# Patient Record
Sex: Female | Born: 1985 | Hispanic: No | Marital: Single | Smoking: Former smoker
Health system: Southern US, Community
[De-identification: ages and names within clinical notes are randomized; demographics above are authoritative.]

## PROBLEM LIST (undated history)

## (undated) ENCOUNTER — Inpatient Hospital Stay (HOSPITAL_COMMUNITY): Payer: Self-pay

## (undated) DIAGNOSIS — R87619 Unspecified abnormal cytological findings in specimens from cervix uteri: Secondary | ICD-10-CM

## (undated) DIAGNOSIS — N83299 Other ovarian cyst, unspecified side: Secondary | ICD-10-CM

## (undated) HISTORY — DX: Unspecified abnormal cytological findings in specimens from cervix uteri: R87.619

---
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
## 2007-01-03 ENCOUNTER — Inpatient Hospital Stay (HOSPITAL_COMMUNITY): Admission: AD | Admit: 2007-01-03 | Discharge: 2007-01-03 | Payer: Self-pay | Admitting: Obstetrics and Gynecology

## 2007-01-03 ENCOUNTER — Ambulatory Visit: Payer: Self-pay | Admitting: Obstetrics and Gynecology

## 2007-01-30 ENCOUNTER — Ambulatory Visit (HOSPITAL_COMMUNITY): Admission: RE | Admit: 2007-01-30 | Discharge: 2007-01-30 | Payer: Self-pay | Admitting: Gynecology

## 2007-03-01 ENCOUNTER — Ambulatory Visit: Payer: Self-pay | Admitting: Obstetrics and Gynecology

## 2007-03-01 ENCOUNTER — Inpatient Hospital Stay (HOSPITAL_COMMUNITY): Admission: AD | Admit: 2007-03-01 | Discharge: 2007-03-01 | Payer: Self-pay | Admitting: Obstetrics and Gynecology

## 2007-03-08 ENCOUNTER — Ambulatory Visit (HOSPITAL_COMMUNITY): Admission: RE | Admit: 2007-03-08 | Discharge: 2007-03-08 | Payer: Self-pay | Admitting: Family Medicine

## 2007-03-20 ENCOUNTER — Inpatient Hospital Stay (HOSPITAL_COMMUNITY): Admission: AD | Admit: 2007-03-20 | Discharge: 2007-03-22 | Payer: Self-pay | Admitting: Gynecology

## 2007-03-20 ENCOUNTER — Ambulatory Visit: Payer: Self-pay | Admitting: Family Medicine

## 2007-12-06 IMAGING — US US OB COMP +14 WK
1 series · 14 of 28 positions shown · non-contrast
Comparison: none

OBSTETRICAL ULTRASOUND:

 This ultrasound exam was performed in the [HOSPITAL] Ultrasound Department.  The OB US report was generated in the [facility] system, and faxed to the ordering physician.  This report is also available in [REDACTED] PACS.

[Series 1: us ob comp +14 wk · 14 of 46 slices shown]
[im 2/46]
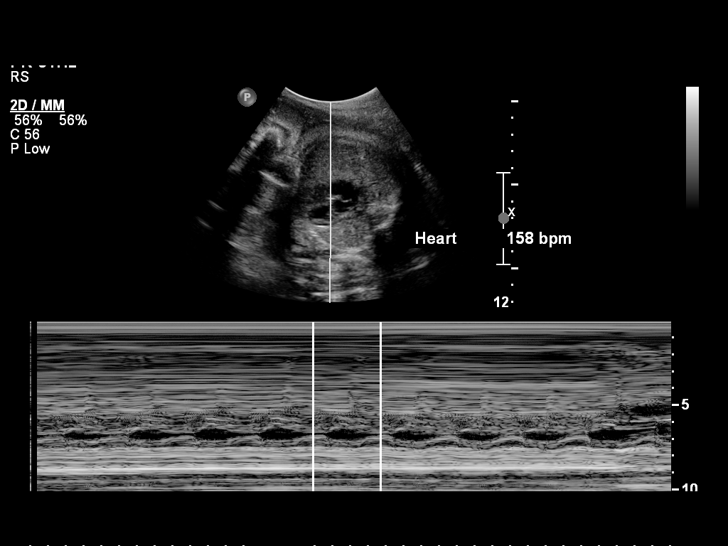
[im 6/46]
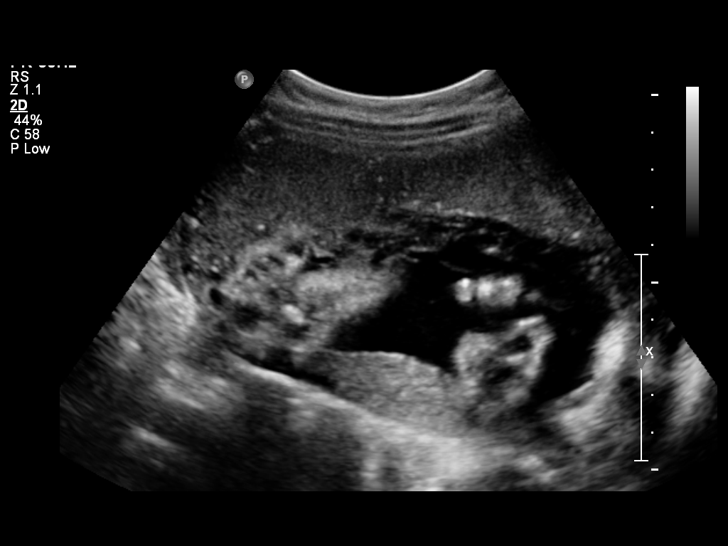
[im 9/46]
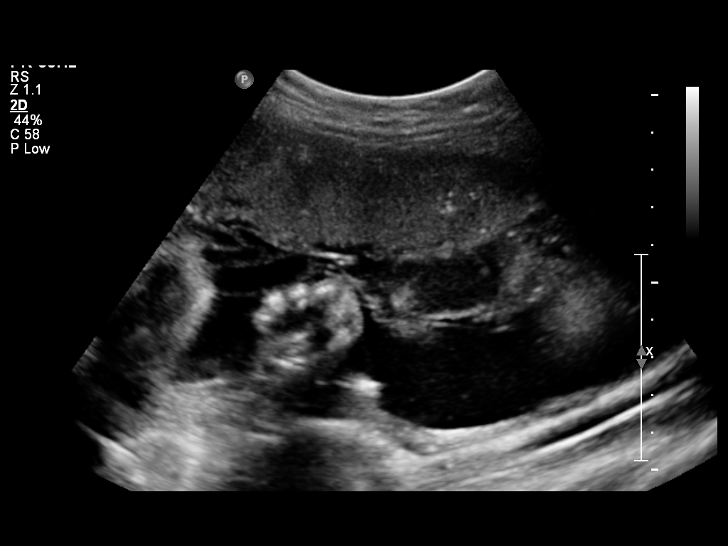
[im 12/46]
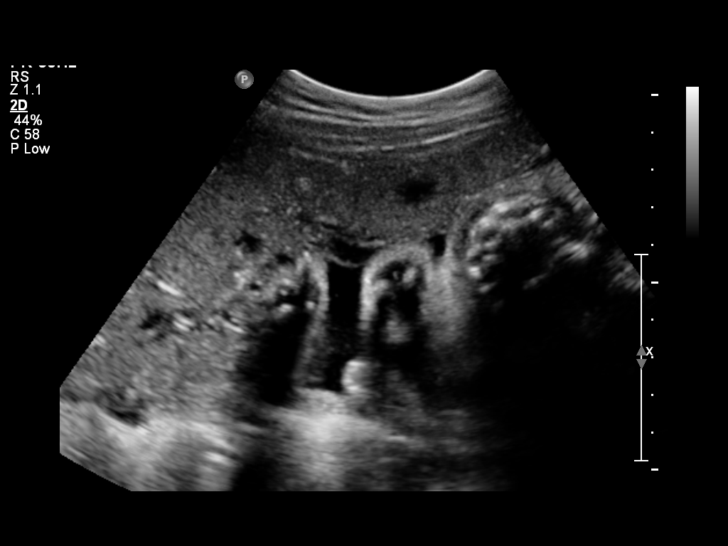
[im 16/46]
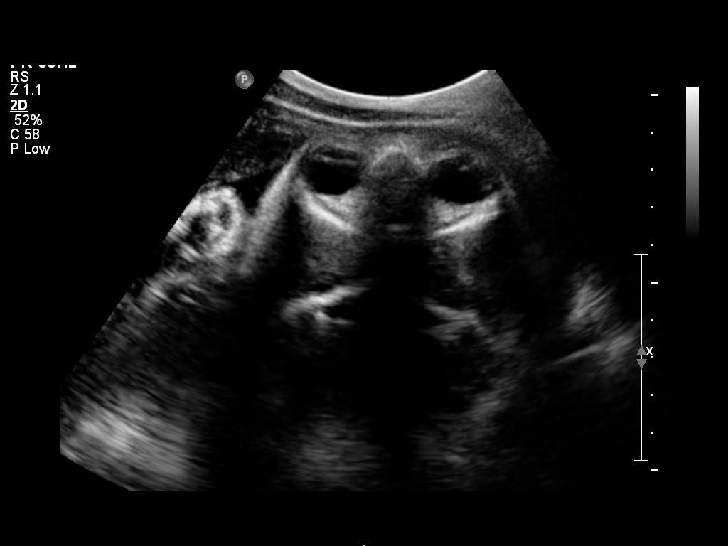
[im 19/46]
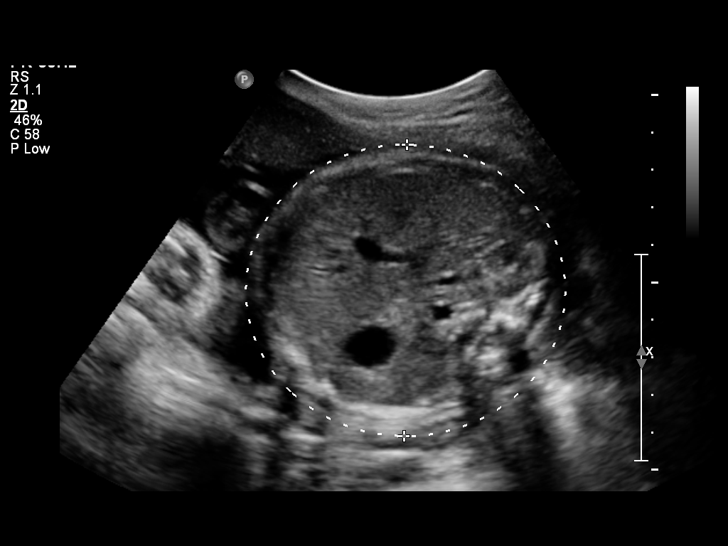
[im 22/46]
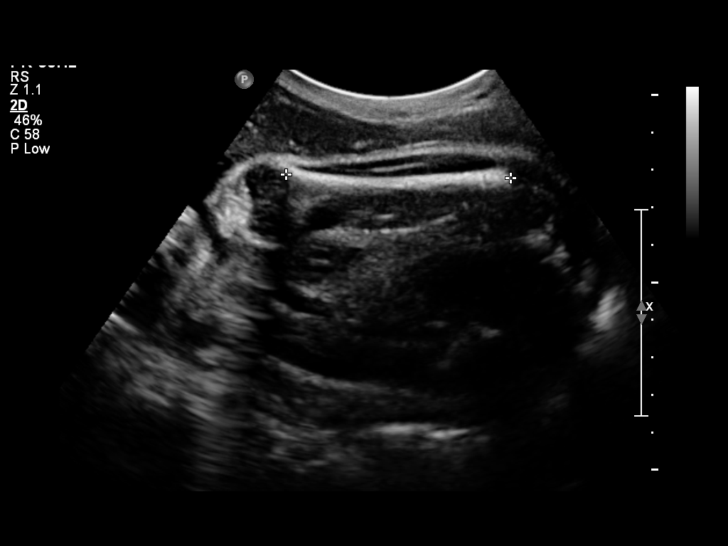
[im 26/46]
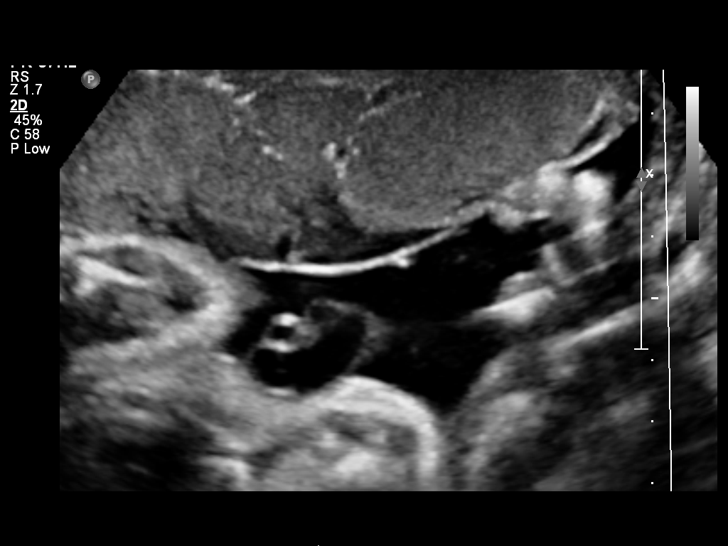
[im 29/46]
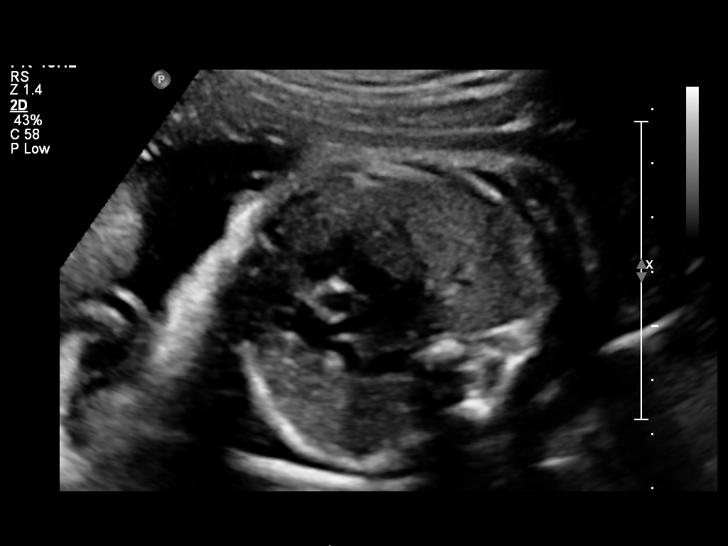
[im 32/46]
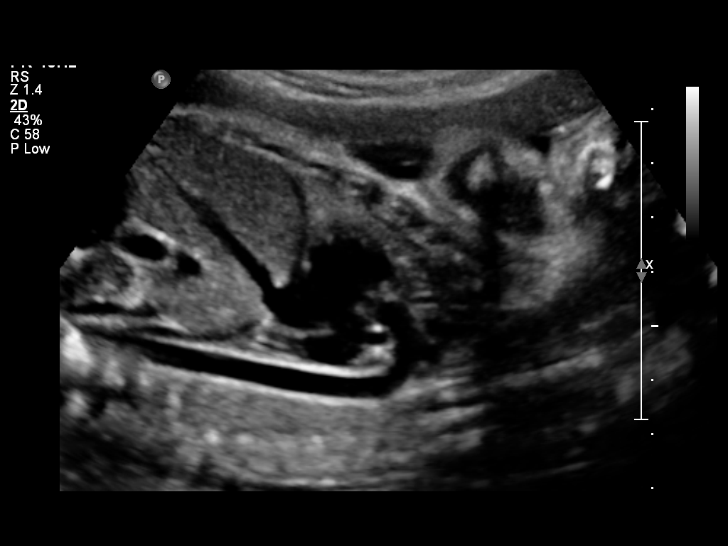
[im 36/46]
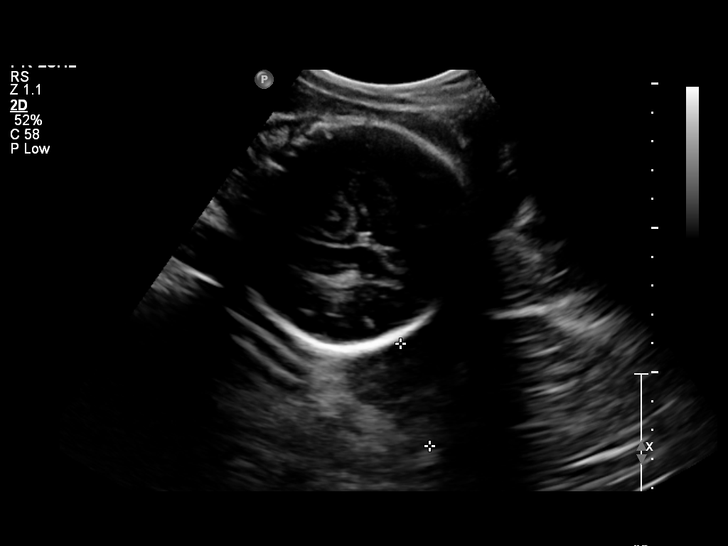
[im 39/46]
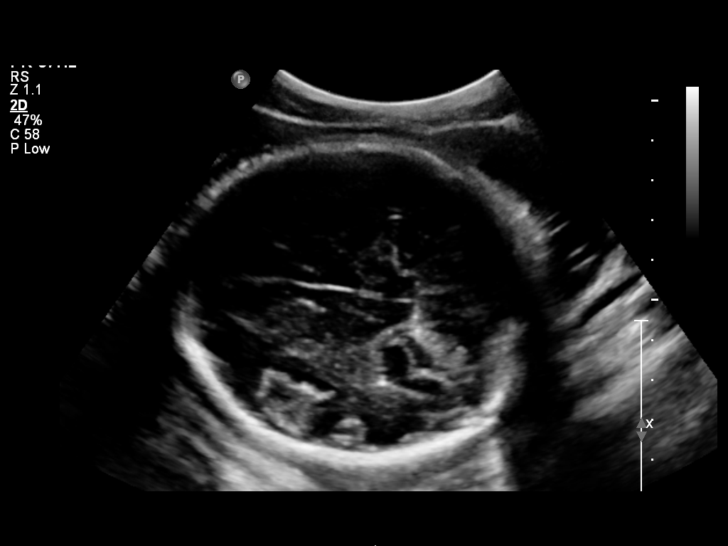
[im 42/46]
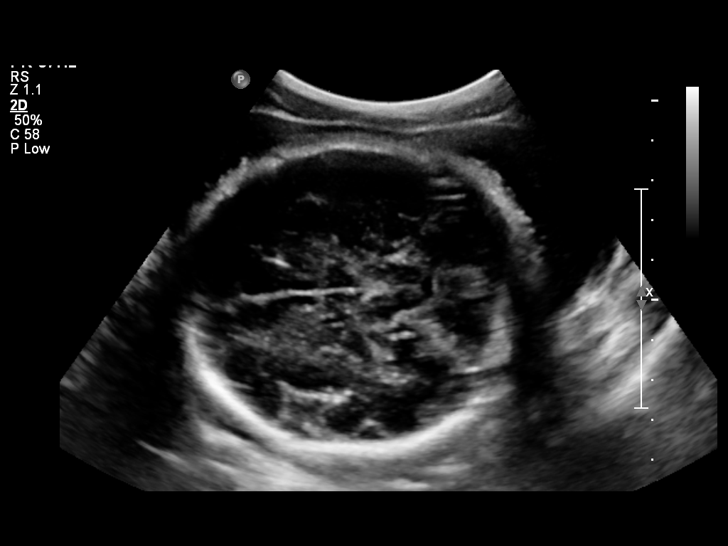
[im 46/46]
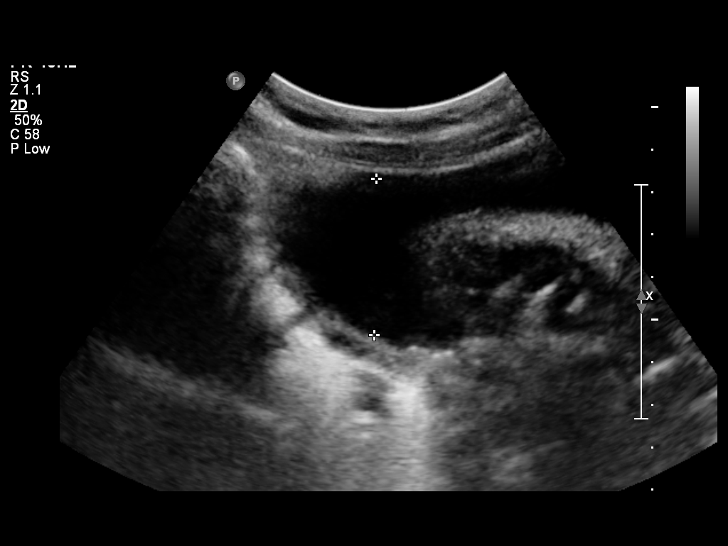

[14 of 28 positions shown; findings below may reference images not displayed]

IMPRESSION: See [facility] Obstetric US report.

## 2008-10-05 ENCOUNTER — Emergency Department: Payer: Self-pay | Admitting: Emergency Medicine

## 2008-10-21 ENCOUNTER — Emergency Department: Payer: Self-pay | Admitting: Emergency Medicine

## 2009-02-18 ENCOUNTER — Emergency Department: Payer: Self-pay | Admitting: Emergency Medicine

## 2009-11-07 ENCOUNTER — Emergency Department: Payer: Self-pay | Admitting: Emergency Medicine

## 2009-11-14 ENCOUNTER — Emergency Department: Payer: Self-pay | Admitting: Emergency Medicine

## 2009-12-16 ENCOUNTER — Emergency Department: Payer: Self-pay | Admitting: Emergency Medicine

## 2010-02-13 ENCOUNTER — Observation Stay: Payer: Self-pay

## 2010-03-12 ENCOUNTER — Emergency Department: Payer: Self-pay | Admitting: Emergency Medicine

## 2010-03-28 ENCOUNTER — Ambulatory Visit: Payer: Self-pay | Admitting: Obstetrics and Gynecology

## 2010-05-05 ENCOUNTER — Observation Stay: Payer: Self-pay | Admitting: Obstetrics & Gynecology

## 2010-05-20 ENCOUNTER — Observation Stay: Payer: Self-pay | Admitting: Obstetrics and Gynecology

## 2010-05-30 ENCOUNTER — Ambulatory Visit: Payer: Self-pay | Admitting: Family Medicine

## 2010-06-03 ENCOUNTER — Observation Stay: Payer: Self-pay

## 2010-06-07 ENCOUNTER — Observation Stay: Payer: Self-pay

## 2010-06-09 ENCOUNTER — Observation Stay: Payer: Self-pay

## 2010-06-10 ENCOUNTER — Inpatient Hospital Stay: Payer: Self-pay | Admitting: Obstetrics and Gynecology

## 2011-04-13 LAB — CBC
Hemoglobin: 11.4 — ABNORMAL LOW
MCHC: 33
RDW: 14.5 — ABNORMAL HIGH
WBC: 14.4 — ABNORMAL HIGH

## 2011-04-18 LAB — WET PREP, GENITAL: Trich, Wet Prep: NONE SEEN

## 2011-04-18 LAB — RAPID URINE DRUG SCREEN, HOSP PERFORMED
Amphetamines: NOT DETECTED
Barbiturates: NOT DETECTED
Benzodiazepines: NOT DETECTED
Cocaine: NOT DETECTED
Tetrahydrocannabinol: POSITIVE — AB

## 2011-04-18 LAB — URINALYSIS, ROUTINE W REFLEX MICROSCOPIC
Hgb urine dipstick: NEGATIVE
Ketones, ur: NEGATIVE
Specific Gravity, Urine: 1.01
Urobilinogen, UA: 1

## 2011-04-18 LAB — GC/CHLAMYDIA PROBE AMP, GENITAL
Chlamydia, DNA Probe: POSITIVE — AB
GC Probe Amp, Genital: NEGATIVE

## 2011-06-24 ENCOUNTER — Emergency Department: Payer: Self-pay | Admitting: Emergency Medicine

## 2011-10-02 ENCOUNTER — Emergency Department: Payer: Self-pay | Admitting: Emergency Medicine

## 2011-10-02 LAB — BASIC METABOLIC PANEL
Anion Gap: 12 (ref 7–16)
BUN: 11 mg/dL (ref 7–18)
Calcium, Total: 9.2 mg/dL (ref 8.5–10.1)
Co2: 24 mmol/L (ref 21–32)
Creatinine: 0.74 mg/dL (ref 0.60–1.30)
EGFR (African American): >60
EGFR (Non-African Amer.): >60
Glucose: 95 mg/dL (ref 65–99)
Osmolality: 282 (ref 275–301)
Sodium: 142 mmol/L (ref 136–145)

## 2011-10-02 LAB — CBC
HCT: 39.1 % (ref 35.0–47.0)
HGB: 12.6 g/dL (ref 12.0–16.0)
MCH: 25.2 pg — ABNORMAL LOW (ref 26.0–34.0)
MCHC: 32.1 g/dL (ref 32.0–36.0)
MCV: 79 fL — ABNORMAL LOW (ref 80–100)
Platelet: 173 10*3/uL (ref 150–440)
RBC: 4.99 10*6/uL (ref 3.80–5.20)
WBC: 18.7 10*3/uL — ABNORMAL HIGH (ref 3.6–11.0)

## 2011-10-02 LAB — LIPASE, BLOOD: Lipase: 91 U/L (ref 73–393)

## 2011-10-03 LAB — URINALYSIS, COMPLETE
Bacteria: NONE SEEN
Bilirubin,UR: NEGATIVE
Glucose,UR: NEGATIVE mg/dL (ref 0–75)
Leukocyte Esterase: NEGATIVE
Nitrite: NEGATIVE
Ph: 5 (ref 4.5–8.0)
Protein: 30
Specific Gravity: 1.029 (ref 1.003–1.030)

## 2011-10-03 LAB — RAPID INFLUENZA A&B ANTIGENS

## 2011-10-03 LAB — PREGNANCY, URINE: Pregnancy Test, Urine: NEGATIVE m[IU]/mL

## 2011-11-13 ENCOUNTER — Emergency Department: Payer: Self-pay | Admitting: Emergency Medicine

## 2011-11-13 LAB — URINALYSIS, COMPLETE
Bacteria: NONE SEEN
Bilirubin,UR: NEGATIVE
Leukocyte Esterase: NEGATIVE
Nitrite: NEGATIVE
Ph: 5 (ref 4.5–8.0)
Protein: 30
RBC,UR: <1 /HPF (ref 0–5)
Specific Gravity: 1.03 (ref 1.003–1.030)
WBC UR: 1 /HPF (ref 0–5)

## 2011-11-13 LAB — CBC
HGB: 12.4 g/dL (ref 12.0–16.0)
MCH: 25.1 pg — ABNORMAL LOW (ref 26.0–34.0)
MCV: 79 fL — ABNORMAL LOW (ref 80–100)
Platelet: 194 10*3/uL (ref 150–440)
RBC: 4.93 10*6/uL (ref 3.80–5.20)
RDW: 14.7 % — ABNORMAL HIGH (ref 11.5–14.5)
WBC: 6.8 10*3/uL (ref 3.6–11.0)

## 2011-11-13 LAB — COMPREHENSIVE METABOLIC PANEL
Albumin: 4.4 g/dL (ref 3.4–5.0)
Alkaline Phosphatase: 49 U/L — ABNORMAL LOW (ref 50–136)
Bilirubin,Total: 0.7 mg/dL (ref 0.2–1.0)
Calcium, Total: 9.1 mg/dL (ref 8.5–10.1)
Chloride: 106 mmol/L (ref 98–107)
Co2: 27 mmol/L (ref 21–32)
Creatinine: 0.7 mg/dL (ref 0.60–1.30)
EGFR (African American): >60
EGFR (Non-African Amer.): >60
Glucose: 85 mg/dL (ref 65–99)
Osmolality: 279 (ref 275–301)
Potassium: 3.3 mmol/L — ABNORMAL LOW (ref 3.5–5.1)
Total Protein: 8 g/dL (ref 6.4–8.2)

## 2011-11-13 LAB — PREGNANCY, URINE: Pregnancy Test, Urine: NEGATIVE m[IU]/mL

## 2011-11-13 LAB — WET PREP, GENITAL

## 2011-11-13 LAB — LIPASE, BLOOD: Lipase: 116 U/L (ref 73–393)

## 2012-01-26 ENCOUNTER — Emergency Department: Payer: Self-pay | Admitting: *Deleted

## 2012-01-27 LAB — URINALYSIS, COMPLETE
Bacteria: NONE SEEN
Bilirubin,UR: NEGATIVE
Glucose,UR: NEGATIVE mg/dL (ref 0–75)
Leukocyte Esterase: NEGATIVE
Nitrite: NEGATIVE
Ph: 7 (ref 4.5–8.0)
RBC,UR: NONE SEEN /HPF (ref 0–5)
Specific Gravity: 1.026 (ref 1.003–1.030)
Squamous Epithelial: 1
WBC UR: NONE SEEN /HPF (ref 0–5)

## 2012-01-27 LAB — COMPREHENSIVE METABOLIC PANEL
Albumin: 4.1 g/dL (ref 3.4–5.0)
Alkaline Phosphatase: 41 U/L — ABNORMAL LOW (ref 50–136)
BUN: 11 mg/dL (ref 7–18)
Bilirubin,Total: 0.3 mg/dL (ref 0.2–1.0)
Chloride: 106 mmol/L (ref 98–107)
Creatinine: 0.66 mg/dL (ref 0.60–1.30)
EGFR (African American): >60
Glucose: 89 mg/dL (ref 65–99)
SGPT (ALT): 16 U/L
Total Protein: 7 g/dL (ref 6.4–8.2)

## 2012-01-27 LAB — CBC
HCT: 37.7 % (ref 35.0–47.0)
HGB: 12.3 g/dL (ref 12.0–16.0)
MCV: 79 fL — ABNORMAL LOW (ref 80–100)
Platelet: 169 10*3/uL (ref 150–440)
RBC: 4.77 10*6/uL (ref 3.80–5.20)
WBC: 9.1 10*3/uL (ref 3.6–11.0)

## 2012-01-27 LAB — LIPASE, BLOOD: Lipase: 157 U/L (ref 73–393)

## 2012-01-27 LAB — PREGNANCY, URINE: Pregnancy Test, Urine: NEGATIVE m[IU]/mL

## 2012-03-17 ENCOUNTER — Emergency Department: Payer: Self-pay | Admitting: Emergency Medicine

## 2012-03-17 LAB — URINALYSIS, COMPLETE
Bacteria: NONE SEEN
Bilirubin,UR: NEGATIVE
Glucose,UR: NEGATIVE mg/dL (ref 0–75)
RBC,UR: 1 /HPF (ref 0–5)
Specific Gravity: 1.024 (ref 1.003–1.030)
Squamous Epithelial: 1
WBC UR: 2 /HPF (ref 0–5)

## 2012-03-30 ENCOUNTER — Emergency Department: Payer: Self-pay | Admitting: Emergency Medicine

## 2012-03-30 LAB — BASIC METABOLIC PANEL
BUN: 8 mg/dL (ref 7–18)
Co2: 26 mmol/L (ref 21–32)
Creatinine: 0.64 mg/dL (ref 0.60–1.30)
EGFR (African American): >60
EGFR (Non-African Amer.): >60
Glucose: 83 mg/dL (ref 65–99)
Sodium: 139 mmol/L (ref 136–145)

## 2012-03-30 LAB — WET PREP, GENITAL

## 2012-03-30 LAB — CBC
HCT: 35.4 % (ref 35.0–47.0)
MCHC: 32.7 g/dL (ref 32.0–36.0)
MCV: 77 fL — ABNORMAL LOW (ref 80–100)
Platelet: 210 10*3/uL (ref 150–440)
RDW: 13.5 % (ref 11.5–14.5)
WBC: 9.2 10*3/uL (ref 3.6–11.0)

## 2012-03-30 LAB — URINALYSIS, COMPLETE
Bilirubin,UR: NEGATIVE
Blood: NEGATIVE
Ketone: NEGATIVE
Leukocyte Esterase: NEGATIVE
Ph: 6 (ref 4.5–8.0)
Specific Gravity: 1.026 (ref 1.003–1.030)
Squamous Epithelial: 1

## 2012-03-30 LAB — HCG, QUANTITATIVE, PREGNANCY: Beta Hcg, Quant.: 8593 m[IU]/mL — ABNORMAL HIGH

## 2012-05-03 ENCOUNTER — Emergency Department: Payer: Self-pay | Admitting: Emergency Medicine

## 2012-05-03 LAB — URINALYSIS, COMPLETE
Blood: NEGATIVE
Ketone: NEGATIVE
Ph: 7 (ref 4.5–8.0)
Protein: NEGATIVE
RBC,UR: 1 /HPF (ref 0–5)

## 2012-05-03 LAB — BASIC METABOLIC PANEL
Anion Gap: 12 (ref 7–16)
BUN: 9 mg/dL (ref 7–18)
Chloride: 108 mmol/L — ABNORMAL HIGH (ref 98–107)
Co2: 20 mmol/L — ABNORMAL LOW (ref 21–32)
Creatinine: 0.56 mg/dL — ABNORMAL LOW (ref 0.60–1.30)
EGFR (Non-African Amer.): >60
Glucose: 100 mg/dL — ABNORMAL HIGH (ref 65–99)
Osmolality: 278 (ref 275–301)

## 2012-05-03 LAB — CBC WITH DIFFERENTIAL/PLATELET
Basophil #: 0 10*3/uL (ref 0.0–0.1)
Eosinophil #: 0.1 10*3/uL (ref 0.0–0.7)
Eosinophil %: 0.9 %
HCT: 34.1 % — ABNORMAL LOW (ref 35.0–47.0)
Lymphocyte #: 2.5 10*3/uL (ref 1.0–3.6)
Lymphocyte %: 25.4 %
MCHC: 34 g/dL (ref 32.0–36.0)
MCV: 79 fL — ABNORMAL LOW (ref 80–100)
Neutrophil #: 6.4 10*3/uL (ref 1.4–6.5)
Platelet: 182 10*3/uL (ref 150–440)
RDW: 13.4 % (ref 11.5–14.5)

## 2012-05-03 LAB — WET PREP, GENITAL

## 2012-05-03 LAB — HCG, QUANTITATIVE, PREGNANCY: Beta Hcg, Quant.: 83085 m[IU]/mL — ABNORMAL HIGH

## 2012-05-04 LAB — URINE CULTURE

## 2012-05-30 ENCOUNTER — Emergency Department: Payer: Self-pay | Admitting: Emergency Medicine

## 2012-05-30 LAB — URINALYSIS, COMPLETE
Bacteria: NONE SEEN
Bilirubin,UR: NEGATIVE
Leukocyte Esterase: NEGATIVE
Nitrite: NEGATIVE
Ph: 5 (ref 4.5–8.0)
Protein: 30
RBC,UR: 6 /HPF (ref 0–5)
Specific Gravity: 1.027 (ref 1.003–1.030)

## 2012-07-05 ENCOUNTER — Emergency Department: Payer: Self-pay | Admitting: Emergency Medicine

## 2012-07-05 LAB — URINALYSIS, COMPLETE
Bilirubin,UR: NEGATIVE
Blood: NEGATIVE
Nitrite: NEGATIVE
Ph: 6 (ref 4.5–8.0)
RBC,UR: 1 /HPF (ref 0–5)
Specific Gravity: 1.021 (ref 1.003–1.030)
Squamous Epithelial: 8
WBC UR: 2 /HPF (ref 0–5)

## 2012-07-05 LAB — COMPREHENSIVE METABOLIC PANEL
Albumin: 3.2 g/dL — ABNORMAL LOW (ref 3.4–5.0)
BUN: 7 mg/dL (ref 7–18)
Bilirubin,Total: 0.2 mg/dL (ref 0.2–1.0)
Calcium, Total: 8.7 mg/dL (ref 8.5–10.1)
Chloride: 106 mmol/L (ref 98–107)
Co2: 24 mmol/L (ref 21–32)
EGFR (Non-African Amer.): >60
Glucose: 77 mg/dL (ref 65–99)
Osmolality: 272 (ref 275–301)
Potassium: 3.9 mmol/L (ref 3.5–5.1)
SGOT(AST): 12 U/L — ABNORMAL LOW (ref 15–37)
Sodium: 138 mmol/L (ref 136–145)
Total Protein: 7.2 g/dL (ref 6.4–8.2)

## 2012-07-05 LAB — CBC
HCT: 35.9 % (ref 35.0–47.0)
MCH: 25.3 pg — ABNORMAL LOW (ref 26.0–34.0)
MCV: 78 fL — ABNORMAL LOW (ref 80–100)
Platelet: 171 10*3/uL (ref 150–440)
RBC: 4.59 10*6/uL (ref 3.80–5.20)
RDW: 13.5 % (ref 11.5–14.5)
WBC: 11.2 10*3/uL — ABNORMAL HIGH (ref 3.6–11.0)

## 2012-08-26 ENCOUNTER — Encounter: Payer: Self-pay | Admitting: Maternal and Fetal Medicine

## 2012-09-27 ENCOUNTER — Observation Stay: Payer: Self-pay | Admitting: Obstetrics and Gynecology

## 2012-09-27 LAB — URINALYSIS, COMPLETE
Bilirubin,UR: NEGATIVE
Blood: NEGATIVE
Glucose,UR: NEGATIVE mg/dL (ref 0–75)
Leukocyte Esterase: NEGATIVE
Protein: NEGATIVE
Squamous Epithelial: 1

## 2012-09-27 LAB — WET PREP, GENITAL

## 2012-09-27 LAB — FETAL FIBRONECTIN: Fetal Fibronectin: NEGATIVE

## 2013-12-20 ENCOUNTER — Emergency Department (HOSPITAL_COMMUNITY)
Admission: EM | Admit: 2013-12-20 | Discharge: 2013-12-20 | Disposition: A | Discharge status: Home / Self Care | Payer: Self-pay | Attending: Emergency Medicine | Admitting: Emergency Medicine

## 2013-12-20 ENCOUNTER — Encounter (HOSPITAL_COMMUNITY): Payer: Self-pay | Admitting: Emergency Medicine

## 2013-12-20 DIAGNOSIS — IMO0002 Reserved for concepts with insufficient information to code with codable children: Secondary | ICD-10-CM | POA: Insufficient documentation

## 2013-12-20 DIAGNOSIS — R109 Unspecified abdominal pain: Secondary | ICD-10-CM | POA: Insufficient documentation

## 2013-12-20 DIAGNOSIS — A499 Bacterial infection, unspecified: Secondary | ICD-10-CM | POA: Insufficient documentation

## 2013-12-20 DIAGNOSIS — N949 Unspecified condition associated with female genital organs and menstrual cycle: Secondary | ICD-10-CM | POA: Insufficient documentation

## 2013-12-20 DIAGNOSIS — K6289 Other specified diseases of anus and rectum: Secondary | ICD-10-CM | POA: Insufficient documentation

## 2013-12-20 DIAGNOSIS — R102 Pelvic and perineal pain: Secondary | ICD-10-CM

## 2013-12-20 DIAGNOSIS — R079 Chest pain, unspecified: Secondary | ICD-10-CM | POA: Insufficient documentation

## 2013-12-20 DIAGNOSIS — F419 Anxiety disorder, unspecified: Secondary | ICD-10-CM

## 2013-12-20 DIAGNOSIS — B9689 Other specified bacterial agents as the cause of diseases classified elsewhere: Secondary | ICD-10-CM | POA: Insufficient documentation

## 2013-12-20 DIAGNOSIS — F411 Generalized anxiety disorder: Secondary | ICD-10-CM | POA: Insufficient documentation

## 2013-12-20 DIAGNOSIS — Z3202 Encounter for pregnancy test, result negative: Secondary | ICD-10-CM | POA: Insufficient documentation

## 2013-12-20 DIAGNOSIS — N760 Acute vaginitis: Secondary | ICD-10-CM | POA: Insufficient documentation

## 2013-12-20 LAB — COMPREHENSIVE METABOLIC PANEL
ALBUMIN: 4.3 g/dL (ref 3.5–5.2)
ALT: 11 U/L (ref 0–35)
AST: 15 U/L (ref 0–37)
Alkaline Phosphatase: 45 U/L (ref 39–117)
BILIRUBIN TOTAL: 0.3 mg/dL (ref 0.3–1.2)
BUN: 11 mg/dL (ref 6–23)
CALCIUM: 9.5 mg/dL (ref 8.4–10.5)
CHLORIDE: 105 meq/L (ref 96–112)
CO2: 22 meq/L (ref 19–32)
Creatinine, Ser: 0.65 mg/dL (ref 0.50–1.10)
GFR calc non Af Amer: >90 mL/min (ref >90)
Glucose, Bld: 85 mg/dL (ref 70–99)
POTASSIUM: 3.6 meq/L — AB (ref 3.7–5.3)
Sodium: 141 mEq/L (ref 137–147)
Total Protein: 7.5 g/dL (ref 6.0–8.3)

## 2013-12-20 LAB — CBC WITH DIFFERENTIAL/PLATELET
BASOS PCT: 1 % (ref 0–1)
Basophils Absolute: 0.1 10*3/uL (ref 0.0–0.1)
EOS ABS: 0.2 10*3/uL (ref 0.0–0.7)
Eosinophils Relative: 3 % (ref 0–5)
HEMATOCRIT: 37.6 % (ref 36.0–46.0)
Hemoglobin: 12.2 g/dL (ref 12.0–15.0)
LYMPHS PCT: 40 % (ref 12–46)
Lymphs Abs: 3.5 10*3/uL (ref 0.7–4.0)
MCH: 24.9 pg — AB (ref 26.0–34.0)
MCHC: 32.4 g/dL (ref 30.0–36.0)
MCV: 76.9 fL — ABNORMAL LOW (ref 78.0–100.0)
MONO ABS: 0.4 10*3/uL (ref 0.1–1.0)
MONOS PCT: 4 % (ref 3–12)
Neutro Abs: 4.6 10*3/uL (ref 1.7–7.7)
Neutrophils Relative %: 52 % (ref 43–77)
Platelets: 234 10*3/uL (ref 150–400)
RBC: 4.89 MIL/uL (ref 3.87–5.11)
RDW: 13.7 % (ref 11.5–15.5)
WBC: 8.7 10*3/uL (ref 4.0–10.5)

## 2013-12-20 LAB — URINALYSIS, ROUTINE W REFLEX MICROSCOPIC
Bilirubin Urine: NEGATIVE
Glucose, UA: NEGATIVE mg/dL
Hgb urine dipstick: NEGATIVE
Ketones, ur: NEGATIVE mg/dL
Nitrite: NEGATIVE
Protein, ur: NEGATIVE mg/dL
Specific Gravity, Urine: >1.03 — ABNORMAL HIGH (ref 1.005–1.030)
Urobilinogen, UA: 0.2 mg/dL (ref 0.0–1.0)
pH: 6 (ref 5.0–8.0)

## 2013-12-20 LAB — WET PREP, GENITAL
TRICH WET PREP: NONE SEEN
YEAST WET PREP: NONE SEEN

## 2013-12-20 LAB — URINE MICROSCOPIC-ADD ON

## 2013-12-20 LAB — PREGNANCY, URINE: PREG TEST UR: NEGATIVE

## 2013-12-20 LAB — POC OCCULT BLOOD, ED: FECAL OCCULT BLD: NEGATIVE

## 2013-12-20 MED ORDER — AZITHROMYCIN 250 MG PO TABS
1000.0000 mg | ORAL_TABLET | Freq: Once | ORAL | Status: AC
  Administered 2013-12-20: 1000 mg via ORAL
  Filled 2013-12-20: qty 4

## 2013-12-20 MED ORDER — METRONIDAZOLE 500 MG PO TABS
500.0000 mg | ORAL_TABLET | Freq: Once | ORAL | Status: AC
  Administered 2013-12-20: 500 mg via ORAL
  Filled 2013-12-20: qty 1

## 2013-12-20 MED ORDER — METRONIDAZOLE 500 MG PO TABS: 500.0000 mg | ORAL_TABLET | Freq: Two times a day (BID) | ORAL | Status: DC

## 2013-12-20 MED ORDER — CEFTRIAXONE SODIUM 250 MG IJ SOLR
250.0000 mg | Freq: Once | INTRAMUSCULAR | Status: AC
  Administered 2013-12-20: 250 mg via INTRAMUSCULAR
  Filled 2013-12-20: qty 250

## 2013-12-20 MED ORDER — LIDOCAINE HCL (PF) 1 % IJ SOLN
INTRAMUSCULAR | Status: AC
  Administered 2013-12-20: 2.1 mL
  Filled 2013-12-20: qty 5

## 2013-12-20 NOTE — Discharge Instructions (Signed)
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   Notes  [facility]  [phone]   [facility]    [phone]   [facility] [address] [place], [place] [address] [phone]   [facility] [address] [place]. [address], [place] [phone]   Planned Parenthood    [phone]   [facility]    [phone]   [facility]  [address], [place] Phone:  [phone], Fax:  (  [phone] Hours of Operation:  9 am - 6 pm, M-F.  Also accepts Medicaid/Medicare and self-pay.  [facility]  [address], [place] Phone: [phone](336) [phone], Fax: [phone]. Hours of Operation:  8:30 am - 5:30 pm, M-F.  Also accepts Medicaid and self-pay.  [facility] [place] [address], [place] Phone: [phone](336) [phone]   [facility] [address], [place], [place] [phone], Ext. [phone] Mondays & Thursdays: 7-9 AM.  First 15 patients are seen on a first come, first serve basis.    Medicaid-accepting [facility] Providers:  Organization         Address  Phone   Notes  [facility] [address], [place] [phone]) [phone] Also accepts self-pay patients.  [facility] [address], [place]  [phone](336) [phone]   [facility] [address], [place] [phone]   [facility] [address] [place] [address], [place] [phone]   Renaye RakersVeita Bland [address], [place]   [phone] Only accepts [place] Access [place] patients after they have their name applied to their card.   Self-Pay (no insurance) in [facility] [place]:  Organization         Address  Phone   Notes  Sickle Cell Patients, [facility] [address], [place] [phone]) [phone]   [facility] [address], [place] [phone]) [phone]   [facility] [place]  [address], [place] [phone]   [facility]/Dr. Osei-Bonsu  [address] [place] [address], [place] or [phone], [place] [phone] Phone number for both [place] and [place] locations is the same.  [facility] [address], [place] [phone]) [phone]   [facility]  [address] [place] [address], [place] or [address] [phone]) [phone]) [phone]   [facility] [address], [place] [phone]) [phone], phone; [phone]) [phone], fax Sees patients 1st and 3rd Saturday of every month.  Must not qualify for public or private insurance (i.e. Medicaid, Medicare, [place] Health Choice, Veterans' Benefits)  Household income should be no more than 200% of the poverty level  The clinic cannot treat you if you are pregnant or think you are pregnant  Sexually transmitted diseases are not treated at the clinic.    Dental Care: Organization         Address  Phone  Notes  [facility] [address], [place] [phone]) [phone] Accepts children up to age 26 who are enrolled in [place] or [place] Health Choice; pregnant women with a Medicaid card; and children who have applied for Medicaid or [place] Health Choice, but were declined, whose parents can pay a reduced fee at time of service.  [facility] [place]  [address], [place] [phone]) [phone] Accepts children up to age 26 who are enrolled in [place] or [place] Health Choice; pregnant women with a Medicaid card; and children who have applied for Medicaid or  Health Choice, but were declined, whose parents can pay a reduced fee at time of service.  [facility]  [address], [place] [phone]) [phone] Patients are seen by appointment only. Walk-ins are not accepted. [facility] will see patients 53 years of age and older. Monday - Tuesday (8am-5pm) Most Wednesdays (8:30-5pm) $30 per visit, cash only  [facility]  [address], [facility] [phone] Patients are seen by appointment only. Walk-ins are not accepted. [facility] will see patients 24 years of age and older. One Wednesday Evening (Monthly: Volunteer Based).  $30 per visit, cash only  [employer]  [phone] for adults; Children under age 16, call Graduate Pediatric Dentistry at [phone]. Children aged 68-14, please call [phone] to request a pediatric application.  Dental services are provided in all areas of dental care including fillings, crowns and bridges, complete and partial dentures, implants, gum treatment, root canals, and extractions. Preventive care is  also provided. Treatment is provided to both adults and children. Patients are selected via a lottery and there is often a waiting list.   [facility] [address] [place]  [phone] [email or web address]   [facility] [address] [place], [place] [phone], Ext. [phone] Second and Fourth Thursday of each month, opens at 6:30 AM; Clinic ends at 9 AM.  Patients are seen on a first-come first-served basis, and a limited number are seen during each clinic.   [facility]  [address] [place], [place] [phone]   Eligibility Requirements You must have lived in [place], [place], or [place] counties for at least the last three months.   You cannot be eligible for state or federal sponsored [place], including [employer], [place], or [employer].   You generally cannot be eligible for healthcare insurance through your employer.    How to apply: Eligibility screenings are held every Tuesday and Wednesday afternoon from 1:00 pm until 4:00 pm. You do not need an appointment for the interview!  [facility] [address], [place], [place] [phone]   [facility] Health Department  [phone]   [facility]  [phone]   [facility]  [phone]    Behavioral Health Resources in the Community: Intensive Outpatient Programs Organization         Address  Phone  Notes  [facility] [address] [place], [place] [phone]   [facility] Outpatient [address], [place], [place] [phone]   [facility] [address] [place], [place]  [phone]   [facility] [address]  [place], [place] [phone] or [phone]   Substance Abuse Resources Organization         Address  Phone  Notes  [facility]  [phone]   [facility]  [phone]   [facility]  [phone]   Floydene Flock  [phone]   Residential & Outpatient Substance Abuse Program  [phone]   Psychological Services Organization         Address  Phone  Notes  [facility]  [phone]   [facility]  [phone]   [facility] [address], [place] [phone] or [phone]    Mobile Crisis Teams Organization         Address  Phone  Notes  [facility], Mobile Crisis Care Unit  [phone]   [facility]  [address] [place], [place] [phone]   [facility] [address] [place] [phone]    Self-Help/Support Groups Organization  Address  Phone             Notes  Mental Health Assoc. of  - variety of support groups  [phone]- [phone] Call for more information  Narcotics Anonymous (NA), [facility] [address], [employer] [place]  2 meetings at this location   Statistician         Address  Phone  Notes  [facility] [address],    [place]  [phone]   [facility]  [address], [place] [address], [place], [place] [phone]   [facility] [address] [place], [place] [phone] Admissions: 8am-3pm M-F  [facility] [address]    [place], [place] [phone]   [facility] [address] [place], [place], [place] [phone]   [facility] [address],  [place], [place] [phone]   [facility] - Intensive Outpatient [address], [place], [place] [phone]   [facility] [address]  [place], [place] [phone] or [phone]   [facility] ([facility]) [name], [place], [place] [phone] Accepts Medicaid  [facility] [address],  [place] [phone]  Substance Abuse/Addiction Treatment   [facility] Organization         Address  Phone  Notes  [facility]  [phone]   Angie Fava, PhD [address] [place], [place]   [phone] or [phone]   [facility] Behavioral   [address] [place], [place] [phone]   [facility] [address] [place], [place] [phone] Insurance/Medicaid/sponsorship through [facility] and Families [address]                                    [place], [place] [phone] Therapy/tele-psych/case  [facility] [address], [place] [phone]    Dr. Lolly Mustache  [phone]   [facility]  [employer] [facility] Dept. 1) [address], [place] 2) [address], [place] 3)  [address], [place] [phone]  [phone]   [facility] Child Abuse Hotline [phone] or [phone] (After Hours)

## 2013-12-20 NOTE — ED Provider Notes (Signed)
Medical screening examination/treatment/procedure(s) were performed by non-physician practitioner and as supervising physician I was immediately available for consultation/collaboration.   EKG Interpretation   Date/Time:  Saturday December 20 2013 18:55:45 EDT Ventricular Rate:  56 PR Interval:  150 QRS Duration: 78 QT Interval:  389 QTC Calculation: 375 R Axis:   71 Text Interpretation:  Sinus rhythm No previous ECGs available Confirmed by  Manus GunningANCOUR  MD, STEPHEN [phone]([id number]) on 12/20/2013 8:59:15 PM        Glynn OctaveStephen Rancour, MD 12/20/13 2336
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
## 2013-12-21 LAB — HIV ANTIBODY (ROUTINE TESTING W REFLEX): HIV: NONREACTIVE

## 2013-12-21 LAB — RPR

## 2013-12-22 LAB — URINE CULTURE
Colony Count: NO GROWTH
Culture: NO GROWTH

## 2013-12-22 LAB — GC/CHLAMYDIA PROBE AMP
CT Probe RNA: NEGATIVE
GC Probe RNA: POSITIVE — AB

## 2013-12-23 ENCOUNTER — Telehealth (HOSPITAL_BASED_OUTPATIENT_CLINIC_OR_DEPARTMENT_OTHER): Payer: Self-pay | Admitting: Emergency Medicine

## 2013-12-23 NOTE — Telephone Encounter (Signed)
ID verified. Patient notified of + gonorrhea culture and that treament was given in ED. STD instructions provided, patient verbalized understanding.

## 2013-12-23 NOTE — Telephone Encounter (Signed)
Positive Gonorrhea culture Treated wit Rocephin 250 mg and Zithromax 1,000 mg in ED, sensitive to same per protocol [facility]  Will contact patient. First attempt unsuccesull - left voicemail to call flow managers office

## 2014-05-02 ENCOUNTER — Emergency Department (HOSPITAL_COMMUNITY)
Admission: EM | Admit: 2014-05-02 | Discharge: 2014-05-02 | Disposition: A | Discharge status: Home / Self Care | Payer: Self-pay | Attending: Emergency Medicine | Admitting: Emergency Medicine

## 2014-05-02 ENCOUNTER — Encounter (HOSPITAL_COMMUNITY): Payer: Self-pay | Admitting: Emergency Medicine

## 2014-05-02 DIAGNOSIS — N939 Abnormal uterine and vaginal bleeding, unspecified: Secondary | ICD-10-CM | POA: Insufficient documentation

## 2014-05-02 DIAGNOSIS — Z3202 Encounter for pregnancy test, result negative: Secondary | ICD-10-CM | POA: Insufficient documentation

## 2014-05-02 LAB — I-STAT CHEM 8, ED
BUN: 13 mg/dL (ref 6–23)
Calcium, Ion: 1.14 mmol/L (ref 1.12–1.23)
Chloride: 106 mEq/L (ref 96–112)
Creatinine, Ser: 0.8 mg/dL (ref 0.50–1.10)
Glucose, Bld: 87 mg/dL (ref 70–99)
HCT: 38 % (ref 36.0–46.0)
HEMOGLOBIN: 12.9 g/dL (ref 12.0–15.0)
Potassium: 3.5 mEq/L — ABNORMAL LOW (ref 3.7–5.3)
SODIUM: 138 meq/L (ref 137–147)
TCO2: 24 mmol/L (ref 0–100)

## 2014-05-02 LAB — WET PREP, GENITAL
Clue Cells Wet Prep HPF POC: NONE SEEN
TRICH WET PREP: NONE SEEN
Yeast Wet Prep HPF POC: NONE SEEN

## 2014-05-02 LAB — POC URINE PREG, ED: Preg Test, Ur: NEGATIVE

## 2014-05-02 NOTE — ED Notes (Signed)
Per pt, had a normal period this month-started cramping las night and having vaginal bleeding with clots

## 2014-05-02 NOTE — ED Provider Notes (Signed)
CSN: [id number]     Arrival date & time 05/02/14  1711 History   First MD Initiated Contact with Patient 05/02/14 1748     Chief Complaint  Patient presents with  . Vaginal Bleeding     (Consider location/radiation/quality/duration/timing/severity/associated sxs/prior Treatment) HPI Comments: Patient presents to the emergency department with chief complaint of vaginal bleeding. Patient states that she was having her normal period about a week ago. She states that the bleeding stopped for several days, and then started again. She reports having some blood clots today. She states that she has used approximately 10 pads today. She denies any dizziness, lightheadedness, chest pain, shortness of breath. She denies any abdominal pain or dysuria. She states that she does feel cramps, but no focal pain. She has not tried anything to alleviate her symptoms.  The history is provided by the patient. No language interpreter was used.    History reviewed. No pertinent past medical history. History reviewed. No pertinent past surgical history. No family history on file. History  Substance Use Topics  . Smoking status: Never Smoker   . Smokeless tobacco: Not on file  . Alcohol Use: Yes   OB History   Grav Para Term Preterm Abortions TAB SAB Ect Mult Living                 Review of Systems  Constitutional: Negative for fever and chills.  Respiratory: Negative for shortness of breath.   Cardiovascular: Negative for chest pain.  Gastrointestinal: Negative for nausea, vomiting, diarrhea and constipation.  Genitourinary: Positive for vaginal bleeding. Negative for dysuria.      Allergies  Review of patient's allergies indicates no known allergies.  Home Medications   Prior to Admission medications   Not on File   BP 91/72  Pulse 58  Temp(Src) 98.3 F (36.8 C) (Oral)  Resp 18  SpO2 100%  LMP 04/29/2014 Physical Exam  Nursing note and vitals reviewed. Constitutional: She is  oriented to person, place, and time. She appears well-developed and well-nourished.  HENT:  Head: Normocephalic and atraumatic.  Eyes: Conjunctivae and EOM are normal. Pupils are equal, round, and reactive to light.  Neck: Normal range of motion. Neck supple.  Cardiovascular: Normal rate and regular rhythm.  Exam reveals no gallop and no friction rub.   No murmur heard. Pulmonary/Chest: Effort normal and breath sounds normal. No respiratory distress. She has no wheezes. She has no rales. She exhibits no tenderness.  Abdominal: Soft. Bowel sounds are normal. She exhibits no distension and no mass. There is no tenderness. There is no rebound and no guarding.  Genitourinary:  Pelvic exam chaperoned by female ER tech, no right or left adnexal tenderness, no uterine tenderness, no vaginal discharge, small amount of blood in the vaginal vault, no hemorrhage or gross bleeding, no CMT or friability, no foreign body, no injury to the external genitalia, no other significant findings   Musculoskeletal: Normal range of motion. She exhibits no edema and no tenderness.  Neurological: She is alert and oriented to person, place, and time.  Skin: Skin is warm and dry.  Psychiatric: She has a normal mood and affect. Her behavior is normal. Judgment and thought content normal.    ED Course  Procedures (including critical care time) Results for orders placed during the hospital encounter of 05/02/14  WET PREP, GENITAL      Result Value Ref Range   Yeast Wet Prep HPF POC NONE SEEN  NONE SEEN   Trich, Wet Prep  NONE SEEN  NONE SEEN   Clue Cells Wet Prep HPF POC NONE SEEN  NONE SEEN   WBC, Wet Prep HPF POC FEW (*) NONE SEEN  POC URINE PREG, ED      Result Value Ref Range   Preg Test, Ur NEGATIVE  NEGATIVE  I-STAT CHEM 8, ED      Result Value Ref Range   Sodium 138  137 - 147 mEq/L   Potassium 3.5 (*) 3.7 - 5.3 mEq/L   Chloride 106  96 - 112 mEq/L   BUN 13  6 - 23 mg/dL   Creatinine, Ser 4.090.80  0.50 -  1.10 mg/dL   Glucose, Bld 87  70 - 99 mg/dL   Calcium, Ion 8.111.14  9.141.12 - 1.23 mmol/L   TCO2 24  0 - 100 mmol/L   Hemoglobin 12.9  12.0 - 15.0 g/dL   HCT 78.238.0  95.636.0 - 21.346.0 %   No results found.    EKG Interpretation None      MDM   Final diagnoses:  Vaginal bleeding    Patient with vaginal bleeding. Will check labs, will perform pelvic recess.  Pregnancy test negative. Pelvic exam is unremarkable for adnexal or uterine tenderness. There is no hemorrhage on exam. Patient's H&H is stable. She is not dizzy or lightheaded. Plan for OB/GYN follow-up.  Patient discussed with Dr. Romeo AppleHarrison, who agrees with the plan.    Roxy Horsemanobert Drisana Schweickert, PA-C 05/02/14 2023

## 2014-05-02 NOTE — Discharge Instructions (Signed)
Abnormal Uterine Bleeding Abnormal uterine bleeding can affect women at various stages in life, including teenagers, women in their reproductive years, pregnant women, and women who have reached menopause. Several kinds of uterine bleeding are considered abnormal, including:  Bleeding or spotting between periods.   Bleeding after sexual intercourse.   Bleeding that is heavier or more than normal.   Periods that last longer than usual.  Bleeding after menopause.  Many cases of abnormal uterine bleeding are minor and simple to treat, while others are more serious. Any type of abnormal bleeding should be evaluated by your health care provider. Treatment will depend on the cause of the bleeding. HOME CARE INSTRUCTIONS Monitor your condition for any changes. The following actions may help to alleviate any discomfort you are experiencing:  Avoid the use of tampons and douches as directed by your health care provider.  Change your pads frequently. You should get regular pelvic exams and Pap tests. Keep all follow-up appointments for diagnostic tests as directed by your health care provider.  SEEK MEDICAL CARE IF:   Your bleeding lasts more than 1 week.   You feel dizzy at times.  SEEK IMMEDIATE MEDICAL CARE IF:   You pass out.   You are changing pads every 15 to 30 minutes.   You have abdominal pain.  You have a fever.   You become sweaty or weak.   You are passing large blood clots from the vagina.   You start to feel nauseous and vomit. MAKE SURE YOU:   Understand these instructions.  Will watch your condition.  Will get help right away if you are not doing well or get worse. Document Released: 06/19/2005 Document Revised: 06/24/2013 Document Reviewed: 01/16/2013 ExitCare Patient Information 2015 [employer]. This information is not intended to replace advice given to you by your health care provider. Make sure you discuss any questions you have with your  health care provider.  

## 2014-05-05 LAB — GC/CHLAMYDIA PROBE AMP
CT Probe RNA: POSITIVE — AB
GC Probe RNA: NEGATIVE

## 2014-05-06 ENCOUNTER — Telehealth: Payer: Self-pay | Admitting: Emergency Medicine

## 2014-05-06 NOTE — Telephone Encounter (Signed)
Positive Chlamydia culture Chart sent to EDP for review 

## 2014-05-07 ENCOUNTER — Telehealth (HOSPITAL_COMMUNITY): Payer: Self-pay

## 2014-05-07 NOTE — ED Notes (Addendum)
Unable to reach by telephone. Letter sent to address on record. [employer] Faxed

## 2014-05-25 ENCOUNTER — Telehealth (HOSPITAL_BASED_OUTPATIENT_CLINIC_OR_DEPARTMENT_OTHER): Payer: Self-pay | Admitting: *Deleted

## 2014-05-31 ENCOUNTER — Emergency Department (HOSPITAL_COMMUNITY)
Admission: EM | Admit: 2014-05-31 | Discharge: 2014-05-31 | Discharge status: Against Medical Advice | Payer: Self-pay | Attending: Emergency Medicine | Admitting: Emergency Medicine

## 2014-05-31 ENCOUNTER — Encounter (HOSPITAL_COMMUNITY): Payer: Self-pay | Admitting: *Deleted

## 2014-05-31 ENCOUNTER — Encounter (HOSPITAL_COMMUNITY): Payer: Self-pay

## 2014-05-31 ENCOUNTER — Emergency Department (HOSPITAL_COMMUNITY)
Admission: EM | Admit: 2014-05-31 | Discharge: 2014-06-01 | Disposition: A | Discharge status: Home / Self Care | Payer: Self-pay | Attending: Emergency Medicine | Admitting: Emergency Medicine

## 2014-05-31 DIAGNOSIS — Z3202 Encounter for pregnancy test, result negative: Secondary | ICD-10-CM | POA: Insufficient documentation

## 2014-05-31 DIAGNOSIS — R109 Unspecified abdominal pain: Secondary | ICD-10-CM | POA: Insufficient documentation

## 2014-05-31 LAB — COMPREHENSIVE METABOLIC PANEL
ALBUMIN: 3.6 g/dL (ref 3.5–5.2)
ALBUMIN: 3.6 g/dL (ref 3.5–5.2)
ALT: 9 U/L (ref 0–35)
ALT: 11 U/L (ref 0–35)
AST: 20 U/L (ref 0–37)
AST: 21 U/L (ref 0–37)
Alkaline Phosphatase: 47 U/L (ref 39–117)
Alkaline Phosphatase: 52 U/L (ref 39–117)
Anion gap: 10 (ref 5–15)
Anion gap: 11 (ref 5–15)
BUN: 10 mg/dL (ref 6–23)
BUN: 10 mg/dL (ref 6–23)
CHLORIDE: 102 meq/L (ref 96–112)
CO2: 25 mEq/L (ref 19–32)
CO2: 24 mEq/L (ref 19–32)
Calcium: 8.6 mg/dL (ref 8.4–10.5)
Calcium: 9 mg/dL (ref 8.4–10.5)
Chloride: 103 mEq/L (ref 96–112)
Creatinine, Ser: 0.59 mg/dL (ref 0.50–1.10)
Creatinine, Ser: 0.64 mg/dL (ref 0.50–1.10)
GFR calc Af Amer: >90 mL/min (ref >90)
GFR calc non Af Amer: >90 mL/min (ref >90)
GFR calc non Af Amer: >90 mL/min (ref >90)
Glucose, Bld: 99 mg/dL (ref 70–99)
Glucose, Bld: 103 mg/dL — ABNORMAL HIGH (ref 70–99)
POTASSIUM: 3.8 meq/L (ref 3.7–5.3)
Potassium: 4.1 mEq/L (ref 3.7–5.3)
SODIUM: 137 meq/L (ref 137–147)
Sodium: 138 mEq/L (ref 137–147)
TOTAL PROTEIN: 7.2 g/dL (ref 6.0–8.3)
Total Bilirubin: <0.2 mg/dL — ABNORMAL LOW (ref 0.3–1.2)
Total Protein: 7.5 g/dL (ref 6.0–8.3)

## 2014-05-31 LAB — CBC WITH DIFFERENTIAL/PLATELET
BASOS ABS: 0.1 10*3/uL (ref 0.0–0.1)
BASOS PCT: 2 % — AB (ref 0–1)
BASOS PCT: 1 % (ref 0–1)
Basophils Absolute: 0.1 10*3/uL (ref 0.0–0.1)
EOS ABS: 0.3 10*3/uL (ref 0.0–0.7)
Eosinophils Absolute: 0.3 10*3/uL (ref 0.0–0.7)
Eosinophils Relative: 4 % (ref 0–5)
Eosinophils Relative: 4 % (ref 0–5)
HCT: 32.3 % — ABNORMAL LOW (ref 36.0–46.0)
HEMATOCRIT: 32.8 % — AB (ref 36.0–46.0)
HEMOGLOBIN: 10.4 g/dL — AB (ref 12.0–15.0)
HEMOGLOBIN: 10.3 g/dL — AB (ref 12.0–15.0)
Lymphocytes Relative: 32 % (ref 12–46)
Lymphocytes Relative: 36 % (ref 12–46)
Lymphs Abs: 2.6 10*3/uL (ref 0.7–4.0)
Lymphs Abs: 2.8 10*3/uL (ref 0.7–4.0)
MCH: 24.4 pg — AB (ref 26.0–34.0)
MCH: 24.8 pg — ABNORMAL LOW (ref 26.0–34.0)
MCHC: 31.7 g/dL (ref 30.0–36.0)
MCHC: 31.9 g/dL (ref 30.0–36.0)
MCV: 76.8 fL — ABNORMAL LOW (ref 78.0–100.0)
MCV: 77.6 fL — ABNORMAL LOW (ref 78.0–100.0)
MONO ABS: 0.4 10*3/uL (ref 0.1–1.0)
MONOS PCT: 6 % (ref 3–12)
MONOS PCT: 7 % (ref 3–12)
Monocytes Absolute: 0.5 10*3/uL (ref 0.1–1.0)
NEUTROS ABS: 4.1 10*3/uL (ref 1.7–7.7)
Neutro Abs: 4.5 10*3/uL (ref 1.7–7.7)
Neutrophils Relative %: 56 % (ref 43–77)
Neutrophils Relative %: 52 % (ref 43–77)
PLATELETS: 262 10*3/uL (ref 150–400)
Platelets: 254 10*3/uL (ref 150–400)
RBC: 4.27 MIL/uL (ref 3.87–5.11)
RBC: 4.16 MIL/uL (ref 3.87–5.11)
RDW: 13.7 % (ref 11.5–15.5)
RDW: 13.8 % (ref 11.5–15.5)
WBC: 7.9 10*3/uL (ref 4.0–10.5)
WBC: 7.8 10*3/uL (ref 4.0–10.5)

## 2014-05-31 LAB — LIPASE, BLOOD: LIPASE: 38 U/L (ref 11–59)

## 2014-05-31 NOTE — ED Notes (Signed)
Pt presents with c/o abdominal pain on the right side for 2 weeks. Pt denies any N/V/D.

## 2014-05-31 NOTE — ED Notes (Addendum)
Pt reports right side abd pain x 2 weeks. Denies any other symptoms. Pain increases with movement and breathing.

## 2014-06-01 LAB — URINALYSIS, ROUTINE W REFLEX MICROSCOPIC
BILIRUBIN URINE: NEGATIVE
Glucose, UA: NEGATIVE mg/dL
Ketones, ur: NEGATIVE mg/dL
Nitrite: NEGATIVE
PH: 7 (ref 5.0–8.0)
Protein, ur: NEGATIVE mg/dL
SPECIFIC GRAVITY, URINE: 1.028 (ref 1.005–1.030)
Urobilinogen, UA: 0.2 mg/dL (ref 0.0–1.0)

## 2014-06-01 LAB — URINE MICROSCOPIC-ADD ON

## 2014-06-01 LAB — POC URINE PREG, ED: Preg Test, Ur: NEGATIVE

## 2014-06-01 MED ORDER — NAPROXEN 500 MG PO TABS: 500.0000 mg | ORAL_TABLET | Freq: Two times a day (BID) | ORAL | Status: DC

## 2014-06-01 NOTE — Discharge Instructions (Signed)
You have been diagnosed with undifferentiated abdominal pain.  Abdominal pain can be caused by many things. Your caregiver evaluates the seriousness of your pain by an examination and possibly blood or urine tests and imaging (CT scan, x-rays, ultrasound). Many cases can be observed and treated at home after initial evaluation in the emergency department. Even though you are being discharged home, abdominal pain can be unpredictable. Therefore, you need a repeat exam if your pain does not resolve, returns, or worsens. Most patient's with abdominal pain do not need to be admitted to the hospital or have surgery, but serious problems like appendicitis and gallbladder attacks can start out as nonspecific pain. Many abdominal conditions cannot be diagnosed in 1 visit, so followup evaluations are very important.  Seek immediate medical attention if:  *The pain does not go away or becomes severe. *Temperature above 101 develops *Repeated vomiting occurs(multiple episodes) *The pain becomes localized to portions of the abdomen. The right side could possibly be appendicitis. In an adult, the left lower portion of the abdomen could be colitis or diverticulitis. *Blood is being passed in stools or vomit *Return also if you develop chest pain, difficulty breathing, dizziness or fainting, or become confused poorly responsive or inconsolable (young children).     If you do not have a physician, you should reference the below phone numbers and call in the morning to establish follow up care.  RESOURCE GUIDE  Dental Problems  Patients with Medicaid: [facility]                     [facility] [address]                                           [address] Phone:  [phone]                                                  Phone:  [phone]  If unable to pay or uninsured, contact:  [facility] or [facility] to become qualified for the adult dental  clinic.  Chronic Pain Problems Contact [facility]  [phone] Patients need to be referred by their primary care doctor.  Insufficient Money for Medicine Contact [employer]:  call "211" or [facility] 271-5999.  No Primary Care Doctor Call [facility]  832-8000 Other agencies that provide inexpensive medical care    [facility]  [phone]    [facility]  [phone]    [facility]  [phone]    [facility] Clinic  [phone]    [facility]  [phone]    [facility]  [phone]  Psychological Services [facility]  [phone] [facility]  [phone] [facility]   800 853-5163 (emergency services 641-4993)  Substance Abuse Resources [facility]  336-882-2125 [facility] 336-784-9470 [facility] 336-285-9073 [facility] 336-845-3988 Residential & Outpatient Substance Abuse Program  800-659-3381  Abuse/Neglect [place] Child Abuse Hotline (336) 641-3795 [place] Child Abuse Hotline 800-378-5315 (After Hours)  Emergency Shelter [facility] (336) 271-5985  M[facility] (336) 275-9566 [facility]   Services (704) 372-4663  MRSA Hotline #:   832-7006    [place] [facility]  [facility] [place]     [employer]                          [facility] [address] [place]                       [address]      [address]  [place]                                                [place]                            [place] Phone:  [phone]                                   Phone:  [phone]                 Phone:  [phone]  [facility] Phone:  [phone]  [place] Child Abuse Hotline (336) 342-1394 (336) 342-3537 (After Hours)    

## 2014-06-01 NOTE — ED Provider Notes (Signed)
CSN: [id number]     Arrival date & time 05/31/14  2052 History   First MD Initiated Contact with Patient 06/01/14 0152     Chief Complaint  Patient presents with  . Abdominal Pain     (Consider location/radiation/quality/duration/timing/severity/associated sxs/prior Treatment) HPI Comments: 28 year old female, no prior abdominal surgical history who presents with a complaint of right lower abdominal side pain. She states this started in her chest 2 weeks ago and initially it hurt to take a deep breath, the pain has gradually moved down and is now located in the right lower side. The pain is constant, it seems to get worse when she changes her position, it feels better when she is bending over at the waist, it is not associated with dysuria, diarrhea, rectal bleeding, hematuria, frequent urination. She states it feels like I have been "punched". She has never had any pain like this in the past, he does not feel like menstrual cramps, she is unsure if she is pregnant. She is currently on her menstrual cycle.  Patient is a 28 y.o. female presenting with abdominal pain. The history is provided by the patient.  Abdominal Pain   History reviewed. No pertinent past medical history. History reviewed. No pertinent past surgical history. No family history on file. History  Substance Use Topics  . Smoking status: Never Smoker   . Smokeless tobacco: Not on file  . Alcohol Use: Yes   OB History    No data available     Review of Systems  Gastrointestinal: Positive for abdominal pain.  All other systems reviewed and are negative.     Allergies  Review of patient's allergies indicates no known allergies.  Home Medications   Prior to Admission medications   Medication Sig Start Date End Date Taking? Authorizing Provider  naproxen (NAPROSYN) 500 MG tablet Take 1 tablet (500 mg total) by mouth 2 (two) times daily with a meal. 06/01/14   Vida RollerBrian D Willadean Guyton, MD   BP 124/65 mmHg  Pulse 57   Temp(Src) 99 F (37.2 C) (Oral)  Resp 16  SpO2 100%  LMP 05/30/2014 Physical Exam  Constitutional: She appears well-developed and well-nourished. No distress.  HENT:  Head: Normocephalic and atraumatic.  Mouth/Throat: Oropharynx is clear and moist. No oropharyngeal exudate.  Eyes: Conjunctivae and EOM are normal. Pupils are equal, round, and reactive to light. Right eye exhibits no discharge. Left eye exhibits no discharge. No scleral icterus.  Neck: Normal range of motion. Neck supple. No JVD present. No thyromegaly present.  Cardiovascular: Normal rate, regular rhythm, normal heart sounds and intact distal pulses.  Exam reveals no gallop and no friction rub.   No murmur heard. Pulmonary/Chest: Effort normal and breath sounds normal. No respiratory distress. She has no wheezes. She has no rales.  Abdominal: Soft. Bowel sounds are normal. She exhibits no distension and no mass. There is tenderness ( No abdominal tenderness to palpation including McBurney's point, right upper quadrant or other locations. There is reproducible tenderness to the right side and CVA).  Musculoskeletal: Normal range of motion. She exhibits no edema or tenderness.  Lymphadenopathy:    She has no cervical adenopathy.  Neurological: She is alert. Coordination normal.  Skin: Skin is warm and dry. No rash noted. No erythema.  Psychiatric: She has a normal mood and affect. Her behavior is normal.  Nursing note and vitals reviewed.   ED Course  Procedures (including critical care time) Labs Review Labs Reviewed  CBC WITH DIFFERENTIAL - Abnormal; Notable  for the following:    Hemoglobin 10.3 (*)    HCT 32.3 (*)    MCV 77.6 (*)    MCH 24.8 (*)    All other components within normal limits  COMPREHENSIVE METABOLIC PANEL - Abnormal; Notable for the following:    Glucose, Bld 103 (*)    Total Bilirubin <0.2 (*)    All other components within normal limits  URINALYSIS, ROUTINE W REFLEX MICROSCOPIC - Abnormal;  Notable for the following:    Hgb urine dipstick LARGE (*)    Leukocytes, UA TRACE (*)    All other components within normal limits  URINE MICROSCOPIC-ADD ON - Abnormal; Notable for the following:    Squamous Epithelial / LPF FEW (*)    All other components within normal limits  POC URINE PREG, ED    Imaging Review No results found.    MDM   Final diagnoses:  Abdominal pain, unspecified abdominal location    Normal vital signs, CBC and basic metabolic panel without acute findings, mild anemia but unlikely related to the patient's acute presentation. We will add on urinalysis and urine pregnancy. Patient in agreement, possibly sidewall pain  . She is no longer having chest pain and has no coughing fevers or chills.  Labs normal, pt stable for d/c, doubt appy - pt given f/u instructions.  Meds given in ED:  Medications - No data to display  New Prescriptions   NAPROXEN (NAPROSYN) 500 MG TABLET    Take 1 tablet (500 mg total) by mouth 2 (two) times daily with a meal.      Vida RollerBrian D Yareth Kearse, MD 06/01/14 [phone]

## 2014-07-08 ENCOUNTER — Emergency Department (HOSPITAL_COMMUNITY)
Admission: EM | Admit: 2014-07-08 | Discharge: 2014-07-08 | Disposition: A | Discharge status: Home / Self Care | Payer: Self-pay | Attending: Emergency Medicine | Admitting: Emergency Medicine

## 2014-07-08 ENCOUNTER — Emergency Department (HOSPITAL_COMMUNITY): Payer: No Typology Code available for payment source

## 2014-07-08 ENCOUNTER — Encounter (HOSPITAL_COMMUNITY): Payer: Self-pay | Admitting: Emergency Medicine

## 2014-07-08 DIAGNOSIS — Y92410 Unspecified street and highway as the place of occurrence of the external cause: Secondary | ICD-10-CM | POA: Insufficient documentation

## 2014-07-08 DIAGNOSIS — Y9389 Activity, other specified: Secondary | ICD-10-CM | POA: Insufficient documentation

## 2014-07-08 DIAGNOSIS — Z791 Long term (current) use of non-steroidal anti-inflammatories (NSAID): Secondary | ICD-10-CM | POA: Insufficient documentation

## 2014-07-08 DIAGNOSIS — Y998 Other external cause status: Secondary | ICD-10-CM | POA: Insufficient documentation

## 2014-07-08 DIAGNOSIS — S02609A Fracture of mandible, unspecified, initial encounter for closed fracture: Secondary | ICD-10-CM | POA: Insufficient documentation

## 2014-07-08 DIAGNOSIS — Z3202 Encounter for pregnancy test, result negative: Secondary | ICD-10-CM | POA: Insufficient documentation

## 2014-07-08 LAB — POC URINE PREG, ED: Preg Test, Ur: NEGATIVE

## 2014-07-08 MED ORDER — OXYCODONE-ACETAMINOPHEN 5-325 MG PO TABS: 1.0000 | ORAL_TABLET | ORAL | Status: DC | PRN

## 2014-07-08 MED ORDER — ONDANSETRON 4 MG PO TBDP: ORAL_TABLET | ORAL | Status: DC

## 2014-07-08 MED ORDER — OXYCODONE-ACETAMINOPHEN 5-325 MG PO TABS
1.0000 | ORAL_TABLET | Freq: Once | ORAL | Status: AC
  Administered 2014-07-08: 1 via ORAL
  Filled 2014-07-08: qty 1

## 2014-07-08 MED ORDER — CEPHALEXIN 500 MG PO CAPS: 500.0000 mg | ORAL_CAPSULE | Freq: Four times a day (QID) | ORAL | Status: DC

## 2014-07-08 NOTE — Discharge Instructions (Signed)
Facial Fracture °A facial fracture is a break in one of the bones of your face. °HOME CARE INSTRUCTIONS  °· Protect the injured part of your face until it is healed. °· Do not participate in activities which give chance for re-injury until your doctor approves. °· Gently wash and dry your face. °· Wear head and facial protection while riding a bicycle, motorcycle, or snowmobile. °SEEK MEDICAL CARE IF:  °· An oral temperature above 102° F (38.9° C) develops. °· You have severe headaches or notice changes in your vision. °· You have new numbness or tingling in your face. °· You develop nausea (feeling sick to your stomach), vomiting or a stiff neck. °SEEK IMMEDIATE MEDICAL CARE IF:  °· You develop difficulty seeing or experience double vision. °· You become dizzy, lightheaded, or faint. °· You develop trouble speaking, breathing, or swallowing. °· You have a watery discharge from your nose or ear. °MAKE SURE YOU:  °· Understand these instructions. °· Will watch your condition. °· Will get help right away if you are not doing well or get worse. °Document Released: 06/19/2005 Document Revised: 09/11/2011 Document Reviewed: 02/06/2008 °ExitCare® Patient Information ©2015 [employer]. This information is not intended to replace advice given to you by your health care provider. Make sure you discuss any questions you have with your health care provider. ° ° ° °

## 2014-07-08 NOTE — ED Notes (Signed)
Paged ENT trauma/[place]

## 2014-07-08 NOTE — ED Notes (Signed)
Pt. Log rolled off of spine board, denies neck or back pain. No crepitus or step-off felt on palpation.

## 2014-07-08 NOTE — ED Notes (Signed)
MD Gentry at bedside.
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
## 2014-07-23 ENCOUNTER — Emergency Department (HOSPITAL_COMMUNITY)
Admission: EM | Admit: 2014-07-23 | Discharge: 2014-07-23 | Disposition: A | Discharge status: Home / Self Care | Payer: No Typology Code available for payment source | Attending: Emergency Medicine | Admitting: Emergency Medicine

## 2014-07-23 ENCOUNTER — Encounter (HOSPITAL_COMMUNITY): Payer: Self-pay

## 2014-07-23 DIAGNOSIS — B9689 Other specified bacterial agents as the cause of diseases classified elsewhere: Secondary | ICD-10-CM

## 2014-07-23 DIAGNOSIS — Z792 Long term (current) use of antibiotics: Secondary | ICD-10-CM | POA: Insufficient documentation

## 2014-07-23 DIAGNOSIS — Z3202 Encounter for pregnancy test, result negative: Secondary | ICD-10-CM | POA: Insufficient documentation

## 2014-07-23 DIAGNOSIS — N760 Acute vaginitis: Secondary | ICD-10-CM | POA: Insufficient documentation

## 2014-07-23 DIAGNOSIS — Z720 Tobacco use: Secondary | ICD-10-CM | POA: Insufficient documentation

## 2014-07-23 LAB — URINALYSIS, ROUTINE W REFLEX MICROSCOPIC
Bilirubin Urine: NEGATIVE
GLUCOSE, UA: NEGATIVE mg/dL
Hgb urine dipstick: NEGATIVE
KETONES UR: NEGATIVE mg/dL
NITRITE: NEGATIVE
Protein, ur: NEGATIVE mg/dL
SPECIFIC GRAVITY, URINE: 1.024 (ref 1.005–1.030)
Urobilinogen, UA: 1 mg/dL (ref 0.0–1.0)
pH: 8 (ref 5.0–8.0)

## 2014-07-23 LAB — URINE MICROSCOPIC-ADD ON

## 2014-07-23 LAB — WET PREP, GENITAL
Trich, Wet Prep: NONE SEEN
Yeast Wet Prep HPF POC: NONE SEEN

## 2014-07-23 LAB — I-STAT BETA HCG BLOOD, ED (MC, WL, AP ONLY): I-stat hCG, quantitative: <5 m[IU]/mL (ref <5)

## 2014-07-23 LAB — POC URINE PREG, ED: Preg Test, Ur: NEGATIVE

## 2014-07-23 MED ORDER — LIDOCAINE HCL 1 % IJ SOLN
INTRAMUSCULAR | Status: AC
  Administered 2014-07-23: 0.9 mL
  Filled 2014-07-23: qty 20

## 2014-07-23 MED ORDER — AZITHROMYCIN 250 MG PO TABS
1000.0000 mg | ORAL_TABLET | Freq: Once | ORAL | Status: AC
  Administered 2014-07-23: 1000 mg via ORAL
  Filled 2014-07-23: qty 4

## 2014-07-23 MED ORDER — METRONIDAZOLE 500 MG PO TABS: 500.0000 mg | ORAL_TABLET | Freq: Two times a day (BID) | ORAL | Status: DC

## 2014-07-23 MED ORDER — CEFTRIAXONE SODIUM 250 MG IJ SOLR
250.0000 mg | Freq: Once | INTRAMUSCULAR | Status: AC
  Administered 2014-07-23: 250 mg via INTRAMUSCULAR
  Filled 2014-07-23: qty 250

## 2014-07-23 NOTE — ED Notes (Addendum)
Pt presents stating that she had a positive home pregnancy test on 1/12 and began having lower abdominal pain and vaginal bleeding x 1 day ago.  Reports all symptoms, but "some discharge" have resolved.  Pt reports discharge is white w/ no odor.  Pt would like to be evaluated.  Last period around 05/22/2014.

## 2014-07-23 NOTE — ED Provider Notes (Signed)
CSN: [id number]     Arrival date & time 07/23/14  1745 History   First MD Initiated Contact with Patient 07/23/14 1819     Chief Complaint  Patient presents with  . Vaginal Bleeding  . Positive Pregnancy Test      (Consider location/radiation/quality/duration/timing/severity/associated sxs/prior Treatment) Patient is a 29 y.o. female presenting with vaginal bleeding. The history is provided by the patient.  Vaginal Bleeding Quality:  Spotting Severity:  Mild Onset quality:  Sudden Duration:  1 day Timing:  Constant Progression:  Resolved Menstrual history:  Regular Possible pregnancy: yes   Context: spontaneously   Relieved by:  None tried Worsened by:  Nothing tried Associated symptoms: vaginal discharge   Associated symptoms: no abdominal pain, no back pain, no dysuria and no nausea   Vaginal discharge:    Quality:  Milky and white   Severity:  Moderate   Onset quality:  Sudden   Duration:  1 day   Timing:  Constant   Progression:  Unchanged   Chronicity:  New Risk factors: unprotected sex   Risk factors: no hx of ectopic pregnancy, does not have multiple partners, no new sexual partner, no PID, no STD and no STD exposure     History reviewed. No pertinent past medical history. History reviewed. No pertinent past surgical history. History reviewed. No pertinent family history. History  Substance Use Topics  . Smoking status: Current Every Day Smoker    Types: Cigarettes  . Smokeless tobacco: Not on file  . Alcohol Use: Yes   OB History    No data available     Review of Systems  Gastrointestinal: Negative for nausea and abdominal pain.  Genitourinary: Positive for vaginal bleeding and vaginal discharge. Negative for dysuria.  Musculoskeletal: Negative for back pain.  All other systems reviewed and are negative.     Allergies  Review of patient's allergies indicates no known allergies.  Home Medications   Prior to Admission medications   Medication  Sig Start Date End Date Taking? Authorizing Provider  cephALEXin (KEFLEX) 500 MG capsule Take 1 capsule (500 mg total) by mouth 4 (four) times daily. Patient not taking: Reported on 07/23/2014 07/08/14   Mirian MoMatthew Gentry, MD  naproxen (NAPROSYN) 500 MG tablet Take 1 tablet (500 mg total) by mouth 2 (two) times daily with a meal. Patient not taking: Reported on 07/08/2014 06/01/14   Vida RollerBrian D Miller, MD  ondansetron (ZOFRAN ODT) 4 MG disintegrating tablet 4mg  ODT q4 hours prn nausea/vomit 07/08/14   Mirian MoMatthew Gentry, MD  oxyCODONE-acetaminophen (PERCOCET/ROXICET) 5-325 MG per tablet Take 1-2 tablets by mouth every 4 (four) hours as needed for severe pain. 07/08/14   Mirian MoMatthew Gentry, MD   BP 134/68 mmHg  Pulse 76  Temp(Src) 98.3 F (36.8 C) (Oral)  Resp 16  SpO2 100%  LMP 05/22/2014 Physical Exam  Constitutional: She is oriented to person, place, and time. She appears well-developed and well-nourished. No distress.  HENT:  Head: Normocephalic and atraumatic.  Mouth/Throat: Oropharynx is clear and moist.  Eyes: Conjunctivae and EOM are normal. Pupils are equal, round, and reactive to light.  Neck: Normal range of motion. Neck supple.  Cardiovascular: Normal rate, regular rhythm and intact distal pulses.   No murmur heard. Pulmonary/Chest: Effort normal and breath sounds normal. No respiratory distress. She has no wheezes. She has no rales.  Abdominal: Soft. She exhibits no distension. There is no tenderness. There is no rebound and no guarding.  Genitourinary: Uterus normal. Cervix exhibits discharge. Cervix exhibits no  motion tenderness and no friability. Right adnexum displays no mass, no tenderness and no fullness. Left adnexum displays no mass, no tenderness and no fullness. Vaginal discharge found.  Copious amounts of thick white discharge  Musculoskeletal: Normal range of motion. She exhibits no edema or tenderness.  Neurological: She is alert and oriented to person, place, and time.  Skin: Skin is  warm and dry. No rash noted. No erythema.  Psychiatric: She has a normal mood and affect. Her behavior is normal.  Nursing note and vitals reviewed.   ED Course  Procedures (including critical care time) Labs Review Labs Reviewed  WET PREP, GENITAL  URINALYSIS, ROUTINE W REFLEX MICROSCOPIC  RPR  HIV ANTIBODY (ROUTINE TESTING)  POC URINE PREG, ED  POC URINE PREG, ED  I-STAT BETA HCG BLOOD, ED ([facility], [facility], [facility] ONLY)  GC/CHLAMYDIA PROBE AMP ([place])    Imaging Review No results found.   EKG Interpretation None      MDM   Final diagnoses:  Bacterial vaginosis    Patient here for abnormal vaginal bleeding, discharge and a positive pregnancy test at home. She has no complaints of abdominal pain, fever, nausea or vomiting. On exam patient has no signs of PID or vaginal bleeding that has a significant amount of discharge. She also states she has unprotected sex with only 1 partner. No prior history of STDs. Also of note patient recently changed so 2 days prior to the discharge starting.  7:41 PM Serum pregnancy test is negative. Beta hCG is negative. Wet prep is positive for numerous white blood cells and clue cells. Will cover for STD with Rocephin and azithromycin but also treat with Flagyl  Gwyneth Sprout, MD 07/23/14 1941

## 2014-07-23 NOTE — Discharge Instructions (Signed)
Bacterial Vaginosis Bacterial vaginosis is a vaginal infection that occurs when the normal balance of bacteria in the vagina is disrupted. It results from an overgrowth of certain bacteria. This is the most common vaginal infection in women of childbearing age. Treatment is important to prevent complications, especially in pregnant women, as it can cause a premature delivery. CAUSES  Bacterial vaginosis is caused by an increase in harmful bacteria that are normally present in smaller amounts in the vagina. Several different kinds of bacteria can cause bacterial vaginosis. However, the reason that the condition develops is not fully understood. RISK FACTORS Certain activities or behaviors can put you at an increased risk of developing bacterial vaginosis, including:  Having a new sex partner or multiple sex partners.  Douching.  Using an intrauterine device (IUD) for contraception. Women do not get bacterial vaginosis from toilet seats, bedding, swimming pools, or contact with objects around them. SIGNS AND SYMPTOMS  Some women with bacterial vaginosis have no signs or symptoms. Common symptoms include:  Grey vaginal discharge.  A fishlike odor with discharge, especially after sexual intercourse.  Itching or burning of the vagina and vulva.  Burning or pain with urination. DIAGNOSIS  Your health care provider will take a medical history and examine the vagina for signs of bacterial vaginosis. A sample of vaginal fluid may be taken. Your health care provider will look at this sample under a microscope to check for bacteria and abnormal cells. A vaginal pH test may also be done.  TREATMENT  Bacterial vaginosis may be treated with antibiotic medicines. These may be given in the form of a pill or a vaginal cream. A second round of antibiotics may be prescribed if the condition comes back after treatment.  HOME CARE INSTRUCTIONS   Only take over-the-counter or prescription medicines as  directed by your health care provider.  If antibiotic medicine was prescribed, take it as directed. Make sure you finish it even if you start to feel better.  Do not have sex until treatment is completed.  Tell all sexual partners that you have a vaginal infection. They should see their health care provider and be treated if they have problems, such as a mild rash or itching.  Practice safe sex by using condoms and only having one sex partner. SEEK MEDICAL CARE IF:   Your symptoms are not improving after 3 days of treatment.  You have increased discharge or pain.  You have a fever. MAKE SURE YOU:   Understand these instructions.  Will watch your condition.  Will get help right away if you are not doing well or get worse. FOR MORE INFORMATION  Centers for Disease Control and Prevention, Division of STD Prevention: www.cdc.gov/std American Sexual Health Association (ASHA): www.ashastd.org  Document Released: 06/19/2005 Document Revised: 04/09/2013 Document Reviewed: 01/29/2013 Exit[employer] Patient Information 2015 [employer]. This information is not intended to replace advice given to you by your health care provider. Make sure you discuss any questions you have with your health care provider.  

## 2014-07-24 LAB — GC/CHLAMYDIA PROBE AMP (~~LOC~~) NOT AT ARMC
Chlamydia: NEGATIVE
NEISSERIA GONORRHEA: NEGATIVE

## 2014-07-25 LAB — HIV ANTIBODY (ROUTINE TESTING W REFLEX)
HIV 1/O/2 Abs-Index Value: <1 (ref <1.00)
HIV-1/HIV-2 Ab: NONREACTIVE

## 2014-07-25 LAB — RPR: RPR Ser Ql: NONREACTIVE

## 2014-11-10 NOTE — H&P (Signed)
L&D Evaluation:  History:  HPI 29 yo [id number] at 7214w1d gestational age who presents with pelvic pressure.  This started at 7pm.  She denies leakage of fluid, vaginal bleeding.  She denies contractions.  SHe denies urinary symptoms and vaginal symptoms.  Her pregnancy has been complicated by a history of trichomonas in this pregnancy, which was treated. She has a history of a LEEP procedure with two full-term deliveries subsequent to the procedure.    AB+, RI, HBsAG neg, RR NR, HIV NR, VZNI   Patient's Medical History No Chronic Illness   Patient's Surgical History LEEP   Medications Pre Natal Vitamins   Allergies NKDA   Social History history of tobacco use prior to pregnancy, distant marijuana use   Family History Non-Contributory   ROS:  ROS All systems were reviewed.  HEENT, CNS, GI, GU, Respiratory, CV, Renal and Musculoskeletal systems were found to be normal., unless noted in HPI   Exam:  Vital Signs stable   General no apparent distress   Mental Status clear   Chest clear   Heart normal sinus rhythm   Abdomen gravid, non-tender   Back no CVAT   Edema no edema   Pelvic no external lesions, 1/50/high, milky, thin discharge   Mebranes Intact   FHT normal rate with no decels   FHT Description 135/mod var/+accels/no decels   Ucx irritability   Skin no lesions   Lymph no lymphadenopathy   Impression:  Impression PTL, reactive NST   Plan:  Plan UA   Comments -fFN = negative -gonorrhea/chlamydia= pending -wet prep + clue cells   Follow Up Appointment need to schedule. in 1 week   Labs:  Lab Results:  BF Analysis:  28-Mar-14 21:24   Fetal Fibronectin (comp) NEGATIVE-Fetal Fibronectin NOT Detected.  Appearance NORMAL  Characteristic CLEAR, COLORLESS, AQUEOUS Specimen characteristic that may be outside the parameter of a clear, colorless, aqueous sample (i.e. pink, bloody, yellow, thick, mucus-like) may cause an invalid or false positive result.   Routine Micro:  28-Mar-14 21:24   Micro Text Report WET PREP   COMMENT                   CLUE CELLS SEEN   COMMENT                   FEW WHITE BLOOD CELLS SEEN   COMMENT                   NO YEAST SEEN   COMMENT                   NO TRICHOMONAS SEEN   COMMENT                   NO SPERMATOZOA SEEN   ANTIBIOTIC                       Comment 1. CLUE CELLS SEEN  Comment 2. FEW WHITE BLOOD CELLS SEEN  Comment 3. NO YEAST SEEN  Comment 4. NO TRICHOMONAS SEEN  Comment 5. NO SPERMATOZOA SEEN  Result(s) reported on 27 Sep 2012 at 10:17PM.  Routine UA:  28-Mar-14 21:24   Color (UA) Colorless  Clarity (UA) Clear  Glucose (UA) Negative  Bilirubin (UA) Negative  Ketones (UA) Negative  Specific Gravity (UA) 1.004  Blood (UA) Negative  pH (UA) 9.0  Protein (UA) Negative  Nitrite (UA) Negative  Leukocyte Esterase (UA) Negative (Result(s) reported on 27 Sep 2012 at 09:53PM.)  RBC (UA) <1 /HPF  WBC (UA) <1 /HPF  Bacteria (UA) TRACE  Epithelial Cells (UA) 1 /HPF (Result(s) reported on 27 Sep 2012 at 09:53PM.)   Electronic Signatures: Conard NovakJackson, Garik Diamant D (MD)  (Signed 28-Mar-14 23:00)  Authored: L&D Evaluation, Labs   Last Updated: 28-Mar-14 23:00 by Conard NovakJackson, Shaquayla Klimas D (MD)

## 2014-11-10 NOTE — H&P (Signed)
L&D Evaluation:  History:   HPI 29 yo G3P2 with EDC=12/21/2011a 7wk5d ultrasound presents at 37 6/7 weeks for a NST and AFI. NST last week was reactive. Had ultrasound 11/28 revealing EFW of 2485 gm and normal AFI=13.43 cm, but Grade 3 placenta. There is a question of IUGR vs SGA baby. Patient is petite with hx of SGA babies at 37 and 38 weeks (5#10oz and 4#11oz) Baby has been active. Has had to wear a pad due to increased discharge and possibly leakage of urine. Ctx have been irregular. Prenatal care at [facility] also remarkable for anemia, no prenatal care between 18 and 35 weeks, PTL at 33 weeks receiving BMZ at 33 weeks, housing and transportation issues, and GBS bacteruria. LABS: AB POS, RI, Varicella equivocal, GBS positive.    Presents with NST/AFI    Patient's Medical History Anemia    Patient's Surgical History conization of cervix    Medications Pre Natal Vitamins    Allergies NKDA    Social History none    Family History Non-Contributory   ROS:   ROS see HPI   Exam:   Vital Signs stable    Urine Protein not completed    General no apparent distress    Mental Status clear    Abdomen gravid, non-tender    Fetal Position cephalic    Pelvic no external lesions, 3/60%/-2    Mebranes Intact, Nitrazine negative.    FHT normal rate with no decels, 140 with accels to 160s    FHT Description moderate variability    Ucx absent    Other AFI+1.33cm+2.41+2.53=6.27cm. +FM and fetal breathing motion seen.   Impression:   Impression IUP at 37 6/7 weeks with reactive NST and borderline AFI.  IUGR vs constitutionally small baby   Plan:   Plan Consulted Dr Tiburcio PeaHarris. Will discharge and have return on riday 12/9 for NST and repeat AFI. Advised to increase water intake and do daily FKCS and report decreased FM. Labor precautions.   Electronic Signatures: Trinna BalloonGutierrez, Colleen L (CNM)  (Signed 07-Dec-11 08:34)  Entered: L&D Evaluation,  Authored: L&D Evaluation  Last Updated:  07-Dec-11 08:34

## 2014-11-10 NOTE — H&P (Signed)
L&D Evaluation:  History:   HPI 29 yo G3P2 at 35 weeks pregnancy comes in with c/o contractions. No l;ekaing of fluid, ctx have decreased in severity since this am. Previous cone biopsy. Prev SVD x2 at 37 and 38 weeks. ABpos/    Presents with contractions    Patient's Medical History No Chronic Illness    Patient's Surgical History conization of cervix    Medications Pre Natal Vitamins    Allergies NKDA    Social History none    Family History Non-Contributory   ROS:   ROS All systems were reviewed.  HEENT, CNS, GI, GU, Respiratory, CV, Renal and Musculoskeletal systems were found to be normal.   Exam:   Vital Signs stable    Urine Protein not completed    General no apparent distress    Mental Status clear    Chest clear    Heart normal sinus rhythm    Abdomen gravid, non-tender    Estimated Fetal Weight Average for gestational age    Back no CVAT    Edema no edema    Reflexes 1+    Pelvic no external lesions, 2-3/50    Mebranes Intact    Ucx irregular    Ucx Frequency -36 min    Skin dry    Lymph no lymphadenopathy   Impression:   Impression dehydration, reactive NST   Plan:   Plan UA, monitor contractions and for cervical change    Follow Up Appointment in 1 week   Electronic Signatures: Adria DevonKlett, Carrie (MD)  (Signed [phone]-Nov-11 09:32)  Entered: L&D Evaluation,  Authored: L&D Evaluation  Last Updated: 18-Nov-11 09:32

## 2014-11-20 ENCOUNTER — Encounter (HOSPITAL_COMMUNITY): Payer: Self-pay | Admitting: Emergency Medicine

## 2014-11-20 ENCOUNTER — Emergency Department (HOSPITAL_COMMUNITY)
Admission: EM | Admit: 2014-11-20 | Discharge: 2014-11-20 | Disposition: A | Discharge status: Home / Self Care | Payer: Self-pay | Attending: Emergency Medicine | Admitting: Emergency Medicine

## 2014-11-20 ENCOUNTER — Emergency Department (HOSPITAL_COMMUNITY): Payer: Self-pay

## 2014-11-20 DIAGNOSIS — N83209 Unspecified ovarian cyst, unspecified side: Secondary | ICD-10-CM

## 2014-11-20 DIAGNOSIS — Z791 Long term (current) use of non-steroidal anti-inflammatories (NSAID): Secondary | ICD-10-CM | POA: Insufficient documentation

## 2014-11-20 DIAGNOSIS — N8320 Unspecified ovarian cysts: Secondary | ICD-10-CM | POA: Insufficient documentation

## 2014-11-20 DIAGNOSIS — Z3202 Encounter for pregnancy test, result negative: Secondary | ICD-10-CM | POA: Insufficient documentation

## 2014-11-20 DIAGNOSIS — R197 Diarrhea, unspecified: Secondary | ICD-10-CM | POA: Insufficient documentation

## 2014-11-20 DIAGNOSIS — R000 Tachycardia, unspecified: Secondary | ICD-10-CM | POA: Insufficient documentation

## 2014-11-20 DIAGNOSIS — Z720 Tobacco use: Secondary | ICD-10-CM | POA: Insufficient documentation

## 2014-11-20 DIAGNOSIS — Z792 Long term (current) use of antibiotics: Secondary | ICD-10-CM | POA: Insufficient documentation

## 2014-11-20 DIAGNOSIS — R1084 Generalized abdominal pain: Secondary | ICD-10-CM

## 2014-11-20 LAB — URINALYSIS, ROUTINE W REFLEX MICROSCOPIC
Bilirubin Urine: NEGATIVE
GLUCOSE, UA: NEGATIVE mg/dL
HGB URINE DIPSTICK: NEGATIVE
KETONES UR: 40 mg/dL — AB
Leukocytes, UA: NEGATIVE
Nitrite: NEGATIVE
PROTEIN: NEGATIVE mg/dL
Specific Gravity, Urine: 1.022 (ref 1.005–1.030)
Urobilinogen, UA: 0.2 mg/dL (ref 0.0–1.0)
pH: 5 (ref 5.0–8.0)

## 2014-11-20 LAB — CBC WITH DIFFERENTIAL/PLATELET
Basophils Absolute: 0 10*3/uL (ref 0.0–0.1)
Basophils Relative: 0 % (ref 0–1)
EOS ABS: 0 10*3/uL (ref 0.0–0.7)
EOS PCT: 0 % (ref 0–5)
HCT: 42.7 % (ref 36.0–46.0)
HEMOGLOBIN: 13.9 g/dL (ref 12.0–15.0)
Lymphocytes Relative: 8 % — ABNORMAL LOW (ref 12–46)
Lymphs Abs: 1.3 10*3/uL (ref 0.7–4.0)
MCH: 25.4 pg — AB (ref 26.0–34.0)
MCHC: 32.6 g/dL (ref 30.0–36.0)
MCV: 78.1 fL (ref 78.0–100.0)
MONOS PCT: 4 % (ref 3–12)
Monocytes Absolute: 0.6 10*3/uL (ref 0.1–1.0)
Neutro Abs: 14.6 10*3/uL — ABNORMAL HIGH (ref 1.7–7.7)
Neutrophils Relative %: 88 % — ABNORMAL HIGH (ref 43–77)
Platelets: 190 10*3/uL (ref 150–400)
RBC: 5.47 MIL/uL — AB (ref 3.87–5.11)
RDW: 13.5 % (ref 11.5–15.5)
WBC: 16.7 10*3/uL — ABNORMAL HIGH (ref 4.0–10.5)

## 2014-11-20 LAB — COMPREHENSIVE METABOLIC PANEL
ALBUMIN: 4.7 g/dL (ref 3.5–5.0)
ALT: 17 U/L (ref 14–54)
AST: 24 U/L (ref 15–41)
Alkaline Phosphatase: 36 U/L — ABNORMAL LOW (ref 38–126)
Anion gap: 10 (ref 5–15)
BUN: 8 mg/dL (ref 6–20)
CHLORIDE: 109 mmol/L (ref 101–111)
CO2: 20 mmol/L — AB (ref 22–32)
Calcium: 9.3 mg/dL (ref 8.9–10.3)
Creatinine, Ser: 0.72 mg/dL (ref 0.44–1.00)
Glucose, Bld: 106 mg/dL — ABNORMAL HIGH (ref 65–99)
Potassium: 3.5 mmol/L (ref 3.5–5.1)
Sodium: 139 mmol/L (ref 135–145)
Total Bilirubin: 0.6 mg/dL (ref 0.3–1.2)
Total Protein: 8.2 g/dL — ABNORMAL HIGH (ref 6.5–8.1)

## 2014-11-20 LAB — I-STAT CG4 LACTIC ACID, ED: LACTIC ACID, VENOUS: 2.29 mmol/L — AB (ref 0.5–2.0)

## 2014-11-20 LAB — POC URINE PREG, ED: Preg Test, Ur: NEGATIVE

## 2014-11-20 LAB — LIPASE, BLOOD: Lipase: 26 U/L (ref 22–51)

## 2014-11-20 MED ORDER — ONDANSETRON 4 MG PO TBDP: 4.0000 mg | ORAL_TABLET | Freq: Three times a day (TID) | ORAL | Status: DC | PRN

## 2014-11-20 MED ORDER — OXYCODONE-ACETAMINOPHEN 5-325 MG PO TABS: 1.0000 | ORAL_TABLET | ORAL | Status: DC | PRN

## 2014-11-20 MED ORDER — ONDANSETRON HCL 4 MG/2ML IJ SOLN
4.0000 mg | Freq: Once | INTRAMUSCULAR | Status: AC
  Administered 2014-11-20: 4 mg via INTRAVENOUS
  Filled 2014-11-20: qty 2

## 2014-11-20 MED ORDER — IOHEXOL 300 MG/ML  SOLN
25.0000 mL | Freq: Once | INTRAMUSCULAR | Status: AC | PRN
  Administered 2014-11-20: 25 mL via ORAL

## 2014-11-20 MED ORDER — HYDROCODONE-ACETAMINOPHEN 5-325 MG PO TABS
1.0000 | ORAL_TABLET | Freq: Four times a day (QID) | ORAL | Status: DC | PRN
Start: 2014-11-20 — End: 2015-02-09

## 2014-11-20 MED ORDER — SODIUM CHLORIDE 0.9 % IV BOLUS (SEPSIS)
1000.0000 mL | Freq: Once | INTRAVENOUS | Status: AC
  Administered 2014-11-20: 1000 mL via INTRAVENOUS

## 2014-11-20 MED ORDER — IOHEXOL 300 MG/ML  SOLN
80.0000 mL | Freq: Once | INTRAMUSCULAR | Status: AC | PRN
  Administered 2014-11-20: 80 mL via INTRAVENOUS

## 2014-11-20 MED ORDER — SENNOSIDES-DOCUSATE SODIUM 8.6-50 MG PO TABS: 1.0000 | ORAL_TABLET | Freq: Every day | ORAL | Status: DC

## 2014-11-20 MED ORDER — PROCHLORPERAZINE EDISYLATE 5 MG/ML IJ SOLN
10.0000 mg | Freq: Once | INTRAMUSCULAR | Status: AC
  Administered 2014-11-20: 10 mg via INTRAVENOUS
  Filled 2014-11-20: qty 2

## 2014-11-20 NOTE — Discharge Instructions (Signed)
As discussed, tonight's evaluation has resulted in a diagnosis of abdominal pain, and ruptured ovarian cyst.  Please monitor your condition carefully, and do not hesitate to return here if you develop new, or concerning changes in your condition.  Otherwise, please be sure to follow up with her gynecologist in 3 days.

## 2014-11-20 NOTE — ED Notes (Signed)
CT notified that the pt is ready for her CT scan.  

## 2014-11-20 NOTE — ED Notes (Signed)
Patient transported to CT 

## 2014-11-20 NOTE — ED Provider Notes (Signed)
CSN: [id number]     Arrival date & time 11/20/14  1533 History   First MD Initiated Contact with Patient 11/20/14 1537     Chief Complaint  Patient presents with  . Emesis  . Fatigue      HPI  Patient presents with concern of generalized illness. Patient states that she woke this morning feeling poorly since that time she has had persistent nausea, anorexia, upper abdominal discomfort. Multiple episodes of vomiting, diarrhea. She describes fever, chills subjectively, without objective fever. She states that she was well prior to the onset of symptoms, last night. She denies substantial medical problems. Neighbors found the patient on the ground, called EMS. Per EMS, the patient had stable vital signs in route. She was vomiting in route. No clear alleviating, exacerbating factors.  History reviewed. No pertinent past medical history. History reviewed. No pertinent past surgical history. History reviewed. No pertinent family history. History  Substance Use Topics  . Smoking status: Current Every Day Smoker    Types: Cigarettes  . Smokeless tobacco: Not on file  . Alcohol Use: Yes   OB History    No data available     Review of Systems  Constitutional:       Per HPI, otherwise negative  HENT:       Per HPI, otherwise negative  Respiratory:       Per HPI, otherwise negative  Cardiovascular:       Per HPI, otherwise negative  Gastrointestinal: Positive for nausea, vomiting and diarrhea.  Endocrine:       Negative aside from HPI  Genitourinary:       Neg aside from HPI   Musculoskeletal:       Per HPI, otherwise negative  Skin: Negative.   Neurological: Negative for syncope.      Allergies  Review of patient's allergies indicates no known allergies.  Home Medications   Prior to Admission medications   Medication Sig Start Date End Date Taking? Authorizing Provider  cephALEXin (KEFLEX) 500 MG capsule Take 1 capsule (500 mg total) by mouth 4 (four) times  daily. Patient not taking: Reported on 07/23/2014 07/08/14   Mirian MoMatthew Gentry, MD  HYDROcodone-acetaminophen (NORCO/VICODIN) 5-325 MG per tablet Take 1 tablet by mouth every 6 (six) hours as needed for severe pain. 11/20/14   Gerhard Munchobert Nahuel Wilbert, MD  metroNIDAZOLE (FLAGYL) 500 MG tablet Take 1 tablet (500 mg total) by mouth 2 (two) times daily. Patient not taking: Reported on 11/20/2014 07/23/14   Gwyneth SproutWhitney Plunkett, MD  naproxen (NAPROSYN) 500 MG tablet Take 1 tablet (500 mg total) by mouth 2 (two) times daily with a meal. Patient not taking: Reported on 07/08/2014 06/01/14   Eber HongBrian Miller, MD  ondansetron (ZOFRAN ODT) 4 MG disintegrating tablet Take 1 tablet (4 mg total) by mouth every 8 (eight) hours as needed for nausea or vomiting. 4mg  ODT q4 hours prn nausea/vomit 11/20/14   Gerhard Munchobert Jeweldean Drohan, MD  senna-docusate (SENOKOT-S) 8.6-50 MG per tablet Take 1 tablet by mouth daily. 11/20/14   Gerhard Munchobert Ronaldo Crilly, MD   BP 118/72 mmHg  Pulse 68  Temp(Src) 97.4 F (36.3 C) (Rectal)  Resp 12  Ht 4\' 11"  (1.499 m)  Wt 117 lb (53.071 kg)  BMI 23.62 kg/m2  SpO2 99%  LMP  Physical Exam  Constitutional: She is oriented to person, place, and time. She appears well-developed and well-nourished. She appears ill.  HENT:  Head: Normocephalic and atraumatic.  Eyes: Conjunctivae and EOM are normal.  Cardiovascular: Normal rate and regular rhythm.  Pulmonary/Chest: Effort normal and breath sounds normal. No stridor. No respiratory distress.  Abdominal: She exhibits no distension.    Musculoskeletal: She exhibits no edema.  Neurological: She is alert and oriented to person, place, and time. No cranial nerve deficit.  Skin: Skin is warm and dry.  Psychiatric: She has a normal mood and affect.  Nursing note and vitals reviewed.   ED Course  Procedures (including critical care time) Labs Review Labs Reviewed  COMPREHENSIVE METABOLIC PANEL - Abnormal; Notable for the following:    CO2 20 (*)    Glucose, Bld 106 (*)     Total Protein 8.2 (*)    Alkaline Phosphatase 36 (*)    All other components within normal limits  CBC WITH DIFFERENTIAL/PLATELET - Abnormal; Notable for the following:    WBC 16.7 (*)    RBC 5.47 (*)    MCH 25.4 (*)    Neutrophils Relative % 88 (*)    Neutro Abs 14.6 (*)    Lymphocytes Relative 8 (*)    All other components within normal limits  URINALYSIS, ROUTINE W REFLEX MICROSCOPIC - Abnormal; Notable for the following:    APPearance CLOUDY (*)    Ketones, ur 40 (*)    All other components within normal limits  I-STAT CG4 LACTIC ACID, ED - Abnormal; Notable for the following:    Lactic Acid, Venous 2.29 (*)    All other components within normal limits  LIPASE, BLOOD  POC URINE PREG, ED    Imaging Review Ct Abdomen Pelvis W Contrast  11/20/2014   CLINICAL DATA:  Patient with upper abdominal pain, fever and fatigue. History vomiting.  EXAM: CT ABDOMEN AND PELVIS WITH CONTRAST  TECHNIQUE: Multidetector CT imaging of the abdomen and pelvis was performed using the standard protocol following bolus administration of intravenous contrast.  CONTRAST:  80mL OMNIPAQUE IOHEXOL 300 MG/ML  SOLN  COMPARISON:  None.  FINDINGS: Lower chest: Visualization of the lower thorax demonstrates no consolidative or nodular pulmonary opacities. Normal heart size.  Hepatobiliary: Liver is normal in size and contour. Sub cm low-attenuation lesion within the central liver (image 13; series 2), too small to accurately characterize. Gallbladder is unremarkable. No intrahepatic or extrahepatic biliary ductal dilatation.  Pancreas: Unremarkable  Spleen: Unremarkable  Adrenals/Urinary Tract: Kidneys enhance symmetrically with contrast. No hydronephrosis. Urinary bladder is grossly unremarkable.  Stomach/Bowel: No evidence for bowel obstruction. No abnormal bowel wall thickening. Normal appendix. No free intraperitoneal air.  Vascular/Lymphatic: Normal caliber abdominal aorta. No retroperitoneal lymphadenopathy.  Other:  Probable corpus luteum within the right ovary. Uterus is grossly unremarkable. There is a moderate amount of free fluid in the pelvis.  Musculoskeletal: No aggressive or acute appearing osseous lesions.  IMPRESSION: 1. Moderate amount of free fluid in the pelvis. Probable corpus luteum within the right ovary. Fluid is nonspecific and may be secondary to ruptured cyst. Consider correlation with pelvic ultrasound as clinically indicated. 2. Normal appendix.   Electronically Signed   By: Annia Beltrew  Davis M.D.   On: 11/20/2014 21:27    Update: Nausea decreased. Patient now more appropriately interactive.   Update: We discussed all findings at length. Patient now recalls history of her similar episodes of abdominal pain without clear diagnosis, but also without imaging studies performed. Patient will follow up with her gynecologist.   MDM   Final diagnoses:  Generalized abdominal pain  Ruptured ovarian cyst    Patient presents with severe abdominal pain, nausea. Patient is tachycardic, very uncomfortable on initial evaluation. Patient presents  essentially with multiple liters of fluid resuscitation, analgesics, antiemetics per Low suspicion for occult infection, and the patient's ovarian cyst likely explains her pain.     Gerhard Munch, MD 11/20/14 2355

## 2014-11-20 NOTE — ED Notes (Signed)
Pt from home via [facility].  Pt neighbors went to home and found pt on the floor at the foot of her bed rolling around not really answering them.  On EMS arrival pt was the same, very clammy.  Small amount of emesis x 2 with EMS and dry heaving.  Vitals normal, NSR.  Pt shaking and shivering on arrival with quiet and short answers to questions, if providing any answer at all.  Pt in NAD, A&O.

## 2014-11-20 NOTE — ED Notes (Signed)
NOTIFIED DR. LOCKWOOD FOR PATIENTS LAB RESULTS OF CG4+LACTIC ACID ,@16 :40 PM ,11/20/2014.

## 2014-11-20 NOTE — ED Notes (Signed)
Pt reports fever, upper abdominal pain, diarrhea, and emesis since she woke up this am.

## 2015-01-30 ENCOUNTER — Emergency Department (HOSPITAL_COMMUNITY)
Admission: EM | Admit: 2015-01-30 | Discharge: 2015-01-30 | Disposition: A | Discharge status: Home / Self Care | Payer: Self-pay | Attending: Emergency Medicine | Admitting: Emergency Medicine

## 2015-01-30 ENCOUNTER — Emergency Department (HOSPITAL_COMMUNITY): Payer: Self-pay

## 2015-01-30 ENCOUNTER — Encounter (HOSPITAL_COMMUNITY): Payer: Self-pay | Admitting: *Deleted

## 2015-01-30 DIAGNOSIS — N898 Other specified noninflammatory disorders of vagina: Secondary | ICD-10-CM | POA: Insufficient documentation

## 2015-01-30 DIAGNOSIS — Z720 Tobacco use: Secondary | ICD-10-CM | POA: Insufficient documentation

## 2015-01-30 DIAGNOSIS — O26899 Other specified pregnancy related conditions, unspecified trimester: Secondary | ICD-10-CM

## 2015-01-30 DIAGNOSIS — R112 Nausea with vomiting, unspecified: Secondary | ICD-10-CM | POA: Insufficient documentation

## 2015-01-30 DIAGNOSIS — R102 Pelvic and perineal pain: Secondary | ICD-10-CM | POA: Insufficient documentation

## 2015-01-30 DIAGNOSIS — Z331 Pregnant state, incidental: Secondary | ICD-10-CM | POA: Insufficient documentation

## 2015-01-30 LAB — COMPREHENSIVE METABOLIC PANEL
ALBUMIN: 4.2 g/dL (ref 3.5–5.0)
ALK PHOS: 33 U/L — AB (ref 38–126)
ALT: 16 U/L (ref 14–54)
AST: 19 U/L (ref 15–41)
Anion gap: 9 (ref 5–15)
BILIRUBIN TOTAL: 0.6 mg/dL (ref 0.3–1.2)
BUN: 10 mg/dL (ref 6–20)
CHLORIDE: 104 mmol/L (ref 101–111)
CO2: 21 mmol/L — AB (ref 22–32)
CREATININE: 0.61 mg/dL (ref 0.44–1.00)
Calcium: 9 mg/dL (ref 8.9–10.3)
Glucose, Bld: 54 mg/dL — ABNORMAL LOW (ref 65–99)
POTASSIUM: 3.6 mmol/L (ref 3.5–5.1)
Sodium: 134 mmol/L — ABNORMAL LOW (ref 135–145)
Total Protein: 7.3 g/dL (ref 6.5–8.1)

## 2015-01-30 LAB — POC OCCULT BLOOD, ED: FECAL OCCULT BLD: NEGATIVE

## 2015-01-30 LAB — CBC
HEMATOCRIT: 39 % (ref 36.0–46.0)
HEMOGLOBIN: 12.6 g/dL (ref 12.0–15.0)
MCH: 25.5 pg — AB (ref 26.0–34.0)
MCHC: 32.3 g/dL (ref 30.0–36.0)
MCV: 78.9 fL (ref 78.0–100.0)
PLATELETS: 228 10*3/uL (ref 150–400)
RBC: 4.94 MIL/uL (ref 3.87–5.11)
RDW: 14 % (ref 11.5–15.5)
WBC: 8.3 10*3/uL (ref 4.0–10.5)

## 2015-01-30 LAB — URINALYSIS, ROUTINE W REFLEX MICROSCOPIC
Bilirubin Urine: NEGATIVE
Glucose, UA: NEGATIVE mg/dL
HGB URINE DIPSTICK: NEGATIVE
Ketones, ur: NEGATIVE mg/dL
Leukocytes, UA: NEGATIVE
NITRITE: NEGATIVE
PH: 5 (ref 5.0–8.0)
Protein, ur: NEGATIVE mg/dL
SPECIFIC GRAVITY, URINE: 1.026 (ref 1.005–1.030)
Urobilinogen, UA: 0.2 mg/dL (ref 0.0–1.0)

## 2015-01-30 LAB — ABO/RH: ABO/RH(D): AB POS

## 2015-01-30 LAB — WET PREP, GENITAL
TRICH WET PREP: NONE SEEN
Yeast Wet Prep HPF POC: NONE SEEN

## 2015-01-30 LAB — PREGNANCY, URINE: Preg Test, Ur: POSITIVE — AB

## 2015-01-30 LAB — CBG MONITORING, ED: Glucose-Capillary: 68 mg/dL (ref 65–99)

## 2015-01-30 LAB — HCG, QUANTITATIVE, PREGNANCY: hCG, Beta Chain, Quant, S: 871 m[IU]/mL — ABNORMAL HIGH (ref <5)

## 2015-01-30 LAB — LIPASE, BLOOD: LIPASE: 20 U/L — AB (ref 22–51)

## 2015-01-30 MED ORDER — SODIUM CHLORIDE 0.9 % IV BOLUS (SEPSIS)
500.0000 mL | Freq: Once | INTRAVENOUS | Status: AC
  Administered 2015-01-30: 500 mL via INTRAVENOUS

## 2015-01-30 MED ORDER — PRENATAL COMPLETE 14-0.4 MG PO TABS: 2.0000 | ORAL_TABLET | Freq: Every day | ORAL | Status: DC

## 2015-01-30 NOTE — ED Notes (Signed)
Patient C/O abdominal pain that began last night.  States that today the pain got worse .  She vomited and states that there was blood in her vomitus.  Indicates that the abdominal pain is in her lower mid abdomen.  Abdomen is non tender to palpation.  Denies and Dysuria or foul smelling urine. Stool pattern is unchanged. Denies blood in stool.

## 2015-01-30 NOTE — ED Notes (Signed)
Patient in US at this time

## 2015-01-30 NOTE — Discharge Instructions (Signed)
1. Medications: Prenatal vitamins, usual home medications 2. Treatment: rest, drink plenty of fluids,  3. Follow Up: Please followup with OB/GYN in 2 days for discussion of your diagnoses and further evaluation after today's visit; if you do not have a primary care doctor use the resource guide provided to find one; Please return to the ER for worsening abd pain, uncontrollable vomiting or other concerns     Abdominal Pain During Pregnancy Abdominal pain is common in pregnancy. Most of the time, it does not cause harm. There are many causes of abdominal pain. Some causes are more serious than others. Some of the causes of abdominal pain in pregnancy are easily diagnosed. Occasionally, the diagnosis takes time to understand. Other times, the cause is not determined. Abdominal pain can be a sign that something is very wrong with the pregnancy, or the pain may have nothing to do with the pregnancy at all. For this reason, always tell your health care provider if you have any abdominal discomfort. HOME CARE INSTRUCTIONS  Monitor your abdominal pain for any changes. The following actions may help to alleviate any discomfort you are experiencing:  Do not have sexual intercourse or put anything in your vagina until your symptoms go away completely.  Get plenty of rest until your pain improves.  Drink clear fluids if you feel nauseous. Avoid solid food as long as you are uncomfortable or nauseous.  Only take over-the-counter or prescription medicine as directed by your health care provider.  Keep all follow-up appointments with your health care provider. SEEK IMMEDIATE MEDICAL CARE IF:  You are bleeding, leaking fluid, or passing tissue from the vagina.  You have increasing pain or cramping.  You have persistent vomiting.  You have painful or bloody urination.  You have a fever.  You notice a decrease in your baby's movements.  You have extreme weakness or feel faint.  You have shortness  of breath, with or without abdominal pain.  You develop a severe headache with abdominal pain.  You have abnormal vaginal discharge with abdominal pain.  You have persistent diarrhea.  You have abdominal pain that continues even after rest, or gets worse. MAKE SURE YOU:   Understand these instructions.  Will watch your condition.  Will get help right away if you are not doing well or get worse. Document Released: 06/19/2005 Document Revised: 04/09/2013 Document Reviewed: 01/16/2013 [facility] Patient Information 2015 [place], [place]. This information is not intended to replace advice given to you by your health care provider. Make sure you discuss any questions you have with your health care provider.    Emergency Department Resource Guide 1) Find a Doctor and Pay Out of Pocket Although you won't have to find out who is covered by your insurance plan, it is a good idea to ask around and get recommendations. You will then need to call the office and see if the doctor you have chosen will accept you as a new patient and what types of options they offer for patients who are self-pay. Some doctors offer discounts or will set up payment plans for their patients who do not have insurance, but you will need to ask so you aren't surprised when you get to your appointment.  2) Contact Your Local Health Department Not all health departments have doctors that can see patients for sick visits, but many do, so it is worth a call to see if yours does. If you don't know where your local health department is, you can check  in your phone book. The CDC also has a tool to help you locate your state's health department, and many state websites also have listings of all of their local health departments.  3) Find a Walk-in Clinic If your illness is not likely to be very severe or complicated, you may want to try a walk in clinic. These are popping up all over the country in pharmacies, drugstores, and shopping  centers. They're usually staffed by nurse practitioners or physician assistants that have been trained to treat common illnesses and complaints. They're usually fairly quick and inexpensive. However, if you have serious medical issues or chronic medical problems, these are probably not your best option.  No Primary Care Doctor: - Call Health Connect at  [phone] - they can help you locate a primary care doctor that  accepts your insurance, provides certain services, etc. - Physician Referral Service- [phone]  Chronic Pain Problems: Organization         Address  Phone   Notes  [facility]  [phone] Patients need to be referred by their primary care doctor.   Medication Assistance: Organization         Address  Phone   Notes  [facility] Medication [facility] [address] [place] [place], [place] [address] [phone] --Must be a resident of [facility] -- Must have NO insurance coverage whatsoever (no Medicaid/ Medicare, etc.) -- The pt. MUST have a primary care doctor that directs their care regularly and follows them in the community   [facility]  [phone]   [employer]  [phone]    Agencies that provide inexpensive medical care: Organization         Address  Phone   Notes  [facility]  [phone]   [facility]    [phone]   [facility] [address] [place], [place] [address] [phone]   [facility] [address] [place] [place] [phone]   Planned Parenthood    [phone]   [facility]    [phone]   [facility] and [facility]  [address], [place] Phone:  [phone], Fax:  [phone] Hours of Operation:  9 am - 6 pm, M-F.  Also accepts Medicaid/Medicare and self-pay.  [facility]  [address], [place] Phone: [phone], Fax: [phone]. Hours of Operation:  8:30 am - 5:30 pm, M-F.  Also accepts Medicaid and self-pay.  [facility] [place] [address], [place] Phone: [phone]   [facility] [address] Natasha Bence [place], [place] [phone], Ext. [phone] Mondays & Thursdays: 7-9 AM.  First 15 patients are seen on a first come, first serve basis.    Medicaid-accepting [facility] Providers:  Organization         Address  Phone   Notes  [facility] [address], [place] [phone] Also accepts self-pay patients.  [facility] [address] Laurell Josephs [place], [place]  [phone]   [facility] [address], [place] [phone]   [facility] [address] [place] [phone]   Renaye Rakers [address], [place]   [phone] Only accepts [place] patients after they have their name applied to their  card.   Self-Pay (no insurance) in [facility] [place]:  Organization         Address  Phone   Notes  Sickle Cell Patients, [facility] [address], [place] [phone]   [facility] [address], [place] [phone]   [facility] [place]  [address],  [phone]   [facility]/Dr. Osei-Bonsu  [address] [place] [address], [place] or [phone], [place] [phone] Phone number for both [place] and [place] locations is the same.  [facility] [address], [place] [phone]   [facility]  [address] [place] [address], [place] or [address] [phone]   [facility] [address], [place] [phone], phone; [phone], fax Sees patients 1st and 3rd Saturday of every month.  Must not qualify for public or private insurance (i.e.  Medicaid, Medicare, [place] Health Choice, Veterans' Benefits)  Household income should be no more than 200% of the poverty level The clinic cannot treat you if you are pregnant or think you are pregnant  Sexually transmitted diseases are not treated at the clinic.    Dental Care: Organization         Address  Phone  Notes  [facility] of [facility] [address], [place] [phone] Accepts children up to age 27 who are enrolled in [place]icaid or [place] Health Choice; pregnant women with a Medicaid card; and children who have applied for Medicaid or [place] Health Choice, but were declined, whose parents can pay a reduced fee at time of service.  [facility] of [facility] [place]  [address], [place] [phone] Accepts children up to age 10221 who are enrolled in [place] or [place] Health Choice; pregnant women with a Medicaid card; and children who have applied for Medicaid or [place] Health Choice, but were declined, whose parents can pay a reduced fee at time of service.  [facility]  [address], [place] [phone] Patients are seen by appointment only. Walk-ins are not accepted. [facility] will see patients 27 years of age and older. Monday - Tuesday (8am-5pm) Most Wednesdays (8:30-5pm) $30 per visit, cash only  [facility]  [address], [facility] [phone] Patients are seen by appointment only. Walk-ins are not accepted. [facility] will see patients 27 years of age and older. One Wednesday Evening (Monthly: Volunteer Based).  $30 per visit, cash only  [employer] [facility]  [phone]) [phone] for adults; Children under age 584, call Graduate Pediatric Dentistry at [phone]. Children aged 234-14, please call [phone] to request a pediatric application.  Dental services are provided in all areas of dental care including fillings,  crowns and bridges, complete and partial dentures, implants, gum treatment, root canals, and extractions. Preventive care is also provided. Treatment is provided to both adults and children. Patients are selected via a lottery and there is often a waiting list.   [facility] [address], [place]  [phone] [email or web address]   [facility] [address], [place], [place] [phone], Ext. [phone] Second and Fourth Thursday of each month, opens at 6:30 AM; Clinic ends at 9 AM.  Patients are seen on a first-come first-served basis, and  a limited number are seen during each clinic.   [facility]  [address], [place], [place] [phone]) [phone]   Eligibility Requirements You must have lived in [place], [place], or [place] counties for at least the last three months.   You cannot be eligible for state or federal sponsored National Cityhealthcare insurance, including CIGNAVeterans Administration, [place]icaid, or [employer]icare.   You generally cannot be eligible for healthcare insurance through your employer.    How to apply: Eligibility screenings are held every Tuesday and Wednesday afternoon from 1:00 pm until 4:00 pm. You do not need an appointment for the interview!  [facility] [address], [place], [place] [phone]   [facility]  [phone]   [facility]  [phone]   [facility]  [phone]    Behavioral Health Resources in the Community: Intensive Outpatient Programs Organization         Address  Phone  Notes  [facility] [address], [place], [place] [phone]   [facility] [address], [place], [place] [phone]   [facility] [address], [place], [place]  [phone]   [facility] [address],  [place], [place] [phone] or [phone]   Substance Abuse  Resources Organization         Address  Phone  Notes  [facility]  [phone]   [facility]  [phone]   [facility]  [phone]   [facility]  [phone]   Residential & Outpatient Substance Abuse Program  [phone]   Psychological Services Organization         Address  Phone  Notes  [facility]  [phone]- [phone]   [facility]  [phone]- [phone]   [facility] [address], [place] [phone] or [phone]    Mobile Crisis Teams Organization         Address  Phone  Notes  [facility], Mobile Crisis Care Unit  [phone]   [facility]  [address] [place], [place] [phone]   Doristine LocksSharon DeEsch [address] [place] [phone]    Self-Help/Support Groups Organization         Address  Phone             Notes  [facility] [place] - variety of support groups  336- [phone] Call for more information  Narcotics Anonymous (NA), [facility] [address], [place]  2 meetings at this location   Statisticianesidential Treatment Programs Organization         Address  Phone  Notes  [facility] [address],    [place]  [phone]   [facility]  [address], [place] [phone], [place], [place] [phone]   [facility] [address], [place] [phone] Admissions: 8am-3pm M-F  [facility] [address]    [place], [place] [phone]   [facility] [address], [place], [place] [phone]   [facility] [address]  [place], [place] [phone]   [facility] - Intensive Outpatient [address], [place], [place] [phone]   [facility] ([facility] [address]  [place], [place] [phone] or [phone]   [facility] (RT[facility]) [address] [place], [place] [phone] Accepts Medicaid  Fellowship  [address]  [place] [phone] Substance Abuse/Addiction Treatment   [facility] Organization         Address  Phone  Notes  [facility]  [phone]   Angie Fava, PhD [address] Ervin Knack [place], [place]   [phone] or [phone]   [facility]   [address] [place], [place] [phone]   [facility] [address], [place], [place] [phone] Insurance/Medicaid/sponsorship through [facility] and Families [address]                                    [place], [place] [phone] Therapy/tele-psych/case  [facility] [address], [place] [phone]    Dr. Lolly Mustache  [phone]   [facility] [place]  [employer] [facility] Dept. 1) [address],  2) [address] [place] 3)  [address], [place] [phone]  [phone]   [facility] Child Abuse Hotline [phone] or [phone] (After Hours)

## 2015-01-30 NOTE — ED Provider Notes (Signed)
CSN: [id number]     Arrival date & time 01/30/15  1314 History   First MD Initiated Contact with Patient 01/30/15 1604     Chief Complaint  Patient presents with  . Hematemesis  . Abdominal Pain     (Consider location/radiation/quality/duration/timing/severity/associated sxs/prior Treatment) The history is provided by the patient and medical records. No language interpreter was used.     Miranda Morrow is a 29 y.o. female  With no major medical problems presents to the Emergency Department complaining of gradual, persistent, progressively worsening and now resolved lower abd pain onset last night and persistent until this morning. Associated symptoms include nausea and vomiting x1 at 12:00pm today.  Emesis had a small streak of blood in it.  No gross hematemesis or clots.  No red foods or drinks.  No further emesis.  Nothing makes it better and nothing makes it worse.  Pt denies fever, chills, headache, neck pain, chest pain, SOB, diarrhea, melena, hematochezia, bleeding from any other site (urine, gingiva, vaginal, epistaxis), dysuria, syncope, weakness.     History reviewed. No pertinent past medical history. History reviewed. No pertinent past surgical history. No family history on file. History  Substance Use Topics  . Smoking status: Current Every Day Smoker    Types: Cigarettes  . Smokeless tobacco: Not on file  . Alcohol Use: Yes   OB History    No data available     Review of Systems  Constitutional: Negative for fever, diaphoresis, appetite change, fatigue and unexpected weight change.  HENT: Negative for mouth sores.   Eyes: Negative for visual disturbance.  Respiratory: Negative for cough, chest tightness, shortness of breath and wheezing.   Cardiovascular: Negative for chest pain.  Gastrointestinal: Positive for nausea, vomiting (x1) and abdominal pain ( lower). Negative for diarrhea and constipation.  Endocrine: Negative for polydipsia, polyphagia and polyuria.   Genitourinary: Negative for dysuria, urgency, frequency and hematuria.  Musculoskeletal: Negative for back pain and neck stiffness.  Skin: Negative for rash.  Allergic/Immunologic: Negative for immunocompromised state.  Neurological: Negative for syncope, light-headedness and headaches.  Hematological: Does not bruise/bleed easily.  Psychiatric/Behavioral: Negative for sleep disturbance. The patient is not nervous/anxious.       Allergies  Review of patient's allergies indicates no known allergies.  Home Medications   Prior to Admission medications   Medication Sig Start Date End Date Taking? Authorizing Provider  HYDROcodone-acetaminophen (NORCO/VICODIN) 5-325 MG per tablet Take 1 tablet by mouth every 6 (six) hours as needed for severe pain. Patient not taking: Reported on 01/30/2015 11/20/14   Gerhard Munch, MD  ondansetron (ZOFRAN ODT) 4 MG disintegrating tablet Take 1 tablet (4 mg total) by mouth every 8 (eight) hours as needed for nausea or vomiting. 4mg  ODT q4 hours prn nausea/vomit Patient not taking: Reported on 01/30/2015 11/20/14   Gerhard Munch, MD  Prenatal Vit-Fe Fumarate-FA (PRENATAL COMPLETE) 14-0.4 MG TABS Take 2 tablets by mouth daily. 01/30/15   Shivani Barrantes, PA-C  senna-docusate (SENOKOT-S) 8.6-50 MG per tablet Take 1 tablet by mouth daily. Patient not taking: Reported on 01/30/2015 11/20/14   Gerhard Munch, MD   BP 131/96 mmHg  Pulse 88  Temp(Src) 98.7 F (37.1 C) (Oral)  Resp 18  SpO2 99%  LMP 01/05/2015 Physical Exam  Constitutional: She appears well-developed and well-nourished. No distress.  Awake, alert, nontoxic appearance  HENT:  Head: Normocephalic and atraumatic.  Mouth/Throat: Oropharynx is clear and moist. No oropharyngeal exudate.  Eyes: Conjunctivae are normal. No scleral icterus.  Neck:  Normal range of motion. Neck supple.  Cardiovascular: Normal rate, regular rhythm, normal heart sounds and intact distal pulses.   No murmur  heard. Pulmonary/Chest: Effort normal and breath sounds normal. No respiratory distress. She has no wheezes.  Equal chest expansion  Abdominal: Soft. Bowel sounds are normal. She exhibits no distension and no mass. There is no tenderness. There is no rebound and no guarding. Hernia confirmed negative in the right inguinal area and confirmed negative in the left inguinal area.  Genitourinary: Uterus normal. No labial fusion. There is no rash, tenderness or lesion on the right labia. There is no rash, tenderness or lesion on the left labia. Uterus is not deviated, not enlarged, not fixed and not tender. Cervix exhibits no motion tenderness, no discharge and no friability. Right adnexum displays no mass, no tenderness and no fullness. Left adnexum displays no mass, no tenderness and no fullness. No erythema, tenderness or bleeding in the vagina. No foreign body around the vagina. No signs of injury around the vagina. Vaginal discharge (thick, white, nonodorous, moderate amount) found.  Musculoskeletal: Normal range of motion. She exhibits no edema.  Lymphadenopathy:       Right: No inguinal adenopathy present.       Left: No inguinal adenopathy present.  Neurological: She is alert.  Speech is clear and goal oriented Moves extremities without ataxia  Skin: Skin is warm and dry. She is not diaphoretic. No erythema.  Psychiatric: She has a normal mood and affect.  Nursing note and vitals reviewed.   ED Course  Procedures (including critical care time) Labs Review Labs Reviewed  WET PREP, GENITAL - Abnormal; Notable for the following:    Clue Cells Wet Prep HPF POC FEW (*)    WBC, Wet Prep HPF POC FEW (*)    All other components within normal limits  LIPASE, BLOOD - Abnormal; Notable for the following:    Lipase 20 (*)    All other components within normal limits  COMPREHENSIVE METABOLIC PANEL - Abnormal; Notable for the following:    Sodium 134 (*)    CO2 21 (*)    Glucose, Bld 54 (*)     Alkaline Phosphatase 33 (*)    All other components within normal limits  CBC - Abnormal; Notable for the following:    MCH 25.5 (*)    All other components within normal limits  PREGNANCY, URINE - Abnormal; Notable for the following:    Preg Test, Ur POSITIVE (*)    All other components within normal limits  HCG, QUANTITATIVE, PREGNANCY - Abnormal; Notable for the following:    hCG, Beta Chain, Quant, S 871 (*)    All other components within normal limits  URINALYSIS, ROUTINE W REFLEX MICROSCOPIC (NOT AT [facility])  POC OCCULT BLOOD, ED  CBG MONITORING, ED  ABO/RH  GC/CHLAMYDIA PROBE AMP ([place]) NOT AT [facility]    Imaging Review US Ob Comp Less 14 Wks  01/30/2015   CLINICAL DATA:  Pregnant patient with lower abdominal pain.  EXAM: OBSTETRIC <14 WK [place] AND TRANSVAGINAL OB US  TECHNIQUE: Both transabdominal and transvaginal ultrasound examinations were performed for complete evaluation of the gestation as well as the maternal uterus, adnexal regions, and pelvic cul-de-sac. Transvaginal technique was performed to assess early pregnancy.  COMPARISON:  None.  FINDINGS: Intrauterine gestational sac: Small fluid sac-like structure in the endometrium measures 2.6 mm.  Yolk sac:  Not present.  Embryo:  Not present.  MSD: 2.6 mm  mm   5  w   0d  Maternal uterus/adnexae: Trace fluid within the endometrial canal that appears separate from the gestational sac but no frank subchorionic hemorrhage. Right ovary measures 4.9 x 2.5 x 4.0 cm. Complex heterogeneous 2.2 x 2.8 x 2.1 cm region in the right ovary may reflect a hemorrhagic corpus luteal cyst. Blood flow seen to the ovarian parenchyma. Left ovary measures 3.6 x 1.9 x 2.0 cm and appears normal with blood flow. Small volume of free fluid in the pelvis.  IMPRESSION: Probable early intrauterine gestational sac, but no yolk sac, fetal pole, or cardiac activity yet visualized. Recommend follow-up quantitative B-HCG levels and follow-up US in 14 days to confirm and  assess viability. This recommendation follows SRU consensus guidelines: Diagnostic Criteria for Nonviable Pregnancy Early in the First Trimester. Malva Limes Med 2013; 161:0960-45.   Electronically Signed   By: Rubye Oaks M.D.   On: 01/30/2015 21:55   US Ob Transvaginal  01/30/2015   CLINICAL DATA:  Pregnant patient with lower abdominal pain.  EXAM: OBSTETRIC <14 WK [place] AND TRANSVAGINAL OB US  TECHNIQUE: Both transabdominal and transvaginal ultrasound examinations were performed for complete evaluation of the gestation as well as the maternal uterus, adnexal regions, and pelvic cul-de-sac. Transvaginal technique was performed to assess early pregnancy.  COMPARISON:  None.  FINDINGS: Intrauterine gestational sac: Small fluid sac-like structure in the endometrium measures 2.6 mm.  Yolk sac:  Not present.  Embryo:  Not present.  MSD: 2.6 mm  mm   5 w   0d  Maternal uterus/adnexae: Trace fluid within the endometrial canal that appears separate from the gestational sac but no frank subchorionic hemorrhage. Right ovary measures 4.9 x 2.5 x 4.0 cm. Complex heterogeneous 2.2 x 2.8 x 2.1 cm region in the right ovary may reflect a hemorrhagic corpus luteal cyst. Blood flow seen to the ovarian parenchyma. Left ovary measures 3.6 x 1.9 x 2.0 cm and appears normal with blood flow. Small volume of free fluid in the pelvis.  IMPRESSION: Probable early intrauterine gestational sac, but no yolk sac, fetal pole, or cardiac activity yet visualized. Recommend follow-up quantitative B-HCG levels and follow-up US in 14 days to confirm and assess viability. This recommendation follows SRU consensus guidelines: Diagnostic Criteria for Nonviable Pregnancy Early in the First Trimester. Malva Limes Med 2013; 409:8119-14.   Electronically Signed   By: Rubye Oaks M.D.   On: 01/30/2015 21:55     EKG Interpretation None      MDM   Final diagnoses:  Pelvic pain in pregnancy  Non-intractable vomiting with nausea, vomiting of  unspecified type   Vladimir Crofts presents with lower abd pain, now resolved and emesis with bloody streak x1.  Pt reports she feels well now, but was concerned about the hematemesis.    6:30PM Labs are reassuring. Patient continues to look well and on repeat exam her abdomen is nontender. Pelvic exam without cervical motion tenderness. Pregnancy test continues dependent.  7:25 PM Pregnancy test positive. Wet prep with a few white blood cells consistent with pregnancy. Will obtain hCG Quant, ABO Rh and ultrasound.  8:55 PM Ultrasound pending. Quant 871. Patient is AB+ and does not need Rhogam.  No vaginal bleeding.    9:59pm [place] with probable early uterine pregnancy; no evidence of ectopic at this time.  No focal abd pain.  Abd is soft and nontender.  Discussed possibility of ectopic pregnancy and importance of HCG follow-up in 48 hours.  Pt states understanding and agrees  to f/u with women's outpatient or MAU.    BP 131/96 mmHg  Pulse 88  Temp(Src) 98.7 F (37.1 C) (Oral)  Resp 18  SpO2 99%  LMP 01/05/2015    Dahlia Client Wentworth Edelen, PA-C 01/30/15 [id number]  Blake Divine, MD 01/30/15 2358

## 2015-01-30 NOTE — ED Notes (Signed)
Pt reports mid abdominal pain and vomitting since last night. Pt reports 1 episode of vomiting bright red blood.

## 2015-02-01 LAB — GC/CHLAMYDIA PROBE AMP (~~LOC~~) NOT AT ARMC
Chlamydia: NEGATIVE
Neisseria Gonorrhea: NEGATIVE

## 2015-02-09 ENCOUNTER — Inpatient Hospital Stay (HOSPITAL_COMMUNITY)
Admission: AD | Admit: 2015-02-09 | Discharge: 2015-02-09 | Disposition: A | Discharge status: Home / Self Care | Payer: Self-pay | Source: Ambulatory Visit | Attending: Family Medicine | Admitting: Family Medicine

## 2015-02-09 ENCOUNTER — Encounter (HOSPITAL_COMMUNITY): Payer: Self-pay

## 2015-02-09 ENCOUNTER — Inpatient Hospital Stay (HOSPITAL_COMMUNITY): Payer: Self-pay

## 2015-02-09 DIAGNOSIS — Z87891 Personal history of nicotine dependence: Secondary | ICD-10-CM | POA: Insufficient documentation

## 2015-02-09 DIAGNOSIS — R1030 Lower abdominal pain, unspecified: Secondary | ICD-10-CM | POA: Insufficient documentation

## 2015-02-09 DIAGNOSIS — Z3A01 Less than 8 weeks gestation of pregnancy: Secondary | ICD-10-CM | POA: Insufficient documentation

## 2015-02-09 DIAGNOSIS — O210 Mild hyperemesis gravidarum: Secondary | ICD-10-CM | POA: Insufficient documentation

## 2015-02-09 DIAGNOSIS — O219 Vomiting of pregnancy, unspecified: Secondary | ICD-10-CM

## 2015-02-09 DIAGNOSIS — O9989 Other specified diseases and conditions complicating pregnancy, childbirth and the puerperium: Secondary | ICD-10-CM

## 2015-02-09 DIAGNOSIS — O26899 Other specified pregnancy related conditions, unspecified trimester: Secondary | ICD-10-CM

## 2015-02-09 DIAGNOSIS — R109 Unspecified abdominal pain: Secondary | ICD-10-CM

## 2015-02-09 HISTORY — DX: Other ovarian cyst, unspecified side: N83.299

## 2015-02-09 LAB — URINALYSIS, ROUTINE W REFLEX MICROSCOPIC
Glucose, UA: NEGATIVE mg/dL
Ketones, ur: 15 mg/dL — AB
Leukocytes, UA: NEGATIVE
NITRITE: NEGATIVE
PH: 5.5 (ref 5.0–8.0)
Protein, ur: NEGATIVE mg/dL
UROBILINOGEN UA: 0.2 mg/dL (ref 0.0–1.0)

## 2015-02-09 LAB — URINE MICROSCOPIC-ADD ON

## 2015-02-09 MED ORDER — PROMETHAZINE HCL 25 MG PO TABS: 12.5000 mg | ORAL_TABLET | Freq: Four times a day (QID) | ORAL | Status: DC | PRN

## 2015-02-09 MED ORDER — PROMETHAZINE HCL 25 MG/ML IJ SOLN
25.0000 mg | Freq: Once | INTRAVENOUS | Status: AC
  Administered 2015-02-09: 25 mg via INTRAVENOUS
  Filled 2015-02-09: qty 1

## 2015-02-09 NOTE — MAU Provider Note (Signed)
History     CSN: [id number]  Arrival date and time: 02/09/15 0236   First Provider Initiated Contact with Patient 02/09/15 [phone]      Chief Complaint  Patient presents with  . Emesis During Pregnancy  . Abdominal Pain   Emesis  This is a new problem. The current episode started today. The problem occurs more than 10 times per day. The problem has been unchanged. The emesis has an appearance of stomach contents. There has been no fever. Associated symptoms include abdominal pain. Pertinent negatives include no diarrhea or fever. Risk factors: pregnancy  She has tried nothing for the symptoms.  Abdominal Pain This is a new problem. The current episode started in the past 7 days. The onset quality is gradual. The pain is located in the suprapubic region. The pain is at a severity of 4/10. The quality of the pain is cramping. The abdominal pain does not radiate. Associated symptoms include nausea and vomiting. Pertinent negatives include no constipation, diarrhea, dysuria, fever or frequency. Associated symptoms comments: She denies any vaginal bleeding. . Nothing aggravates the pain. The pain is relieved by nothing. She has tried nothing for the symptoms.     Past Medical History  Diagnosis Date  . Functional ovarian cysts     Past Surgical History  Procedure Laterality Date  . Colposcopy    . Cryotherapy      History reviewed. No pertinent family history.  History  Substance Use Topics  . Smoking status: Former Smoker    Types: Cigarettes  . Smokeless tobacco: Not on file  . Alcohol Use: Yes    Allergies: No Known Allergies  Prescriptions prior to admission  Medication Sig Dispense Refill Last Dose  . HYDROcodone-acetaminophen (NORCO/VICODIN) 5-325 MG per tablet Take 1 tablet by mouth every 6 (six) hours as needed for severe pain. (Patient not taking: Reported on 01/30/2015) 10 tablet 0 Not Taking at Unknown time  . ondansetron (ZOFRAN ODT) 4 MG disintegrating tablet Take 1  tablet (4 mg total) by mouth every 8 (eight) hours as needed for nausea or vomiting.  ODT q4 hours prn nausea/vomit (Patient not taking: Reported on 01/30/2015) 20 tablet 0 Not Taking at Unknown time  . Prenatal Vit-Fe Fumarate-FA (PRENATAL COMPLETE) 14-0.4 MG TABS Take 2 tablets by mouth daily. 60 each 0   . senna-docusate (SENOKOT-S) 8.6-50 MG per tablet Take 1 tablet by mouth daily. (Patient not taking: Reported on 01/30/2015) 14 tablet 0 Not Taking at Unknown time    Review of Systems  Constitutional: Negative for fever.  Gastrointestinal: Positive for nausea, vomiting and abdominal pain. Negative for diarrhea and constipation.  Genitourinary: Negative for dysuria, urgency and frequency.   Physical Exam   Blood pressure 122/62, pulse 79, temperature 98.8 F (37.1 C), temperature source Oral, resp. rate 16, height 4' 11.8" (1.519 m), weight 50.621 kg (111 lb 9.6 oz), last menstrual period 01/05/2015, SpO2 100 %.  Physical Exam  Nursing note and vitals reviewed. Constitutional: She is oriented to person, place, and time. She appears well-developed and well-nourished. No distress.  HENT:  Head: Normocephalic.  Cardiovascular: Normal rate.   Respiratory: Effort normal.  GI: Soft. There is no tenderness. There is no rebound.  Neurological: She is alert and oriented to person, place, and time.  Skin: Skin is warm and dry.  Psychiatric: She has a normal mood and affect.   Results for orders placed or performed during the hospital encounter of 02/09/15 (from the past 24 hour(s))  Urinalysis, Routine  w reflex microscopic (not at [facility])     Status: Abnormal   Collection Time: 02/09/15  2:50 AM  Result Value Ref Range   Color, Urine YELLOW YELLOW   APPearance CLEAR CLEAR   Specific Gravity, Urine >1.030 (H) 1.005 - 1.030   pH 5.5 5.0 - 8.0   Glucose, UA NEGATIVE NEGATIVE mg/dL   Hgb urine dipstick TRACE (A) NEGATIVE   Bilirubin Urine SMALL (A) NEGATIVE   Ketones, ur 15 (A) NEGATIVE  mg/dL   Protein, ur NEGATIVE NEGATIVE mg/dL   Urobilinogen, UA 0.2 0.0 - 1.0 mg/dL   Nitrite NEGATIVE NEGATIVE   Leukocytes, UA NEGATIVE NEGATIVE  Urine microscopic-add on     Status: Abnormal   Collection Time: 02/09/15  2:50 AM  Result Value Ref Range   Squamous Epithelial / LPF FEW (A) RARE   WBC, UA 0-2 <3 WBC/hpf   RBC / HPF 0-2 <3 RBC/hpf   Bacteria, UA FEW (A) RARE   Urine-Other MUCOUS PRESENT    US Ob Transvaginal  02/09/2015   CLINICAL DATA:  Acute onset of lower abdominal pain. Initial encounter.  EXAM: TRANSVAGINAL OB ULTRASOUND  TECHNIQUE: Transvaginal ultrasound was performed for complete evaluation of the gestation as well as the maternal uterus, adnexal regions, and pelvic cul-de-sac.  COMPARISON:  Pelvic ultrasound performed 01/30/2015, and CT of the abdomen and pelvis performed 11/20/2014  FINDINGS: Intrauterine gestational sac: Visualized/normal in shape.  Yolk sac:  Yes  Embryo:  Yes  Cardiac Activity: Yes  Heart Rate: 103 bpm  CRL:   2.5  mm   5 w 6 d                  [place] EDC: 10/06/2015  Maternal uterus/adnexae: A small to moderate amount of subchorionic hemorrhage is noted. The uterus is otherwise unremarkable.  The ovaries are unremarkable in appearance. The right ovary measures 5.0 x 4.3 x 2.3 cm, while the left ovary measures 4.1 x 1.7 x 3.4 cm. No suspicious adnexal masses are seen; there is no evidence for ovarian torsion.  Trace free fluid is seen within the pelvic cul-de-sac.  IMPRESSION: 1. Single intrauterine pregnancy noted, with a crown-rump length of 2.5 mm, corresponding to a gestational age of [redacted] weeks 6 days. This matches the gestational age of [redacted] weeks 0 days by LMP, reflecting an estimated date of delivery of October 05, 2015. 2. Small to moderate amount of subchorionic hemorrhage noted.   Electronically Signed   By: Roanna Raider M.D.   On: 02/09/2015 04:46    MAU Course  Procedures  MDM Patient has had 1 L D5LR and 25 mg phenergan. She reports improvement with  her nausea.   Assessment and Plan   1. Nausea/vomiting in pregnancy   2. Abdominal pain affecting pregnancy    DC home Comfort measures reviewed  1st Trimester precautions  RX: phenergan #[id number]  Return to MAU as needed FU with OB as planned  Follow-up Information    Schedule an appointment as soon as possible for a visit with [facility] [place].   Contact information:   [address] [facility] [address] [phone]        Tawnya Crook 02/09/2015, 4:46 AM

## 2015-02-09 NOTE — MAU Note (Signed)
Pt c/o N/V for a few days now. Some lower abdominal pain that she rates 5/10. Denies vag bleeding. Was at [facility] last week-was told to follow up here to r/o tubal pregnancy.

## 2015-02-09 NOTE — Discharge Instructions (Signed)
Prenatal Care Providers °[facility]    [facility] ° Phone- [phone]     Phone: [phone] °         °[facility]                      [facility] ° @[place]     Phone: [phone] ° Phone: [phone] °        [facility] °[facility]     Phone: [phone] ° Phone: [phone]   °        [facility] °[facility] [place]                hone: [phone] ° Phone: [phone] °        [facility] °Dr. Bernard Marshall      Phone: [phone] ° Phone: [phone] °        [facility] °[facility]                Phone: [phone] ° Women’s Health  ° Phone:[phone]    [facility] °         Phone: [phone] °[facility] °  Phone: [phone] °Safe Medications in Pregnancy  ° °Acne: °Benzoyl Peroxide °Salicylic Acid ° °Backache/Headache: °Tylenol: 2 regular strength every 4 hours OR °             2 Extra strength every 6 hours ° °Colds/Coughs/Allergies: °Benadryl (alcohol free) 25 mg every 6 hours as needed °Breath right strips °Claritin °Cepacol throat lozenges °Chloraseptic throat spray °Cold-Eeze- up to three times per day °Cough drops, alcohol free °Flonase (by prescription only) °Guaifenesin °Mucinex °Robitussin DM (plain only, alcohol free) °Saline nasal spray/drops °Sudafed (pseudoephedrine) & Actifed ** use only after [redacted] weeks gestation and if you do not have high blood pressure °Tylenol °Vicks Vaporub °Zinc lozenges °Zyrtec  ° °Constipation: °Colace °Ducolax suppositories °Fleet enema °Glycerin suppositories °Metamucil °Milk of magnesia °Miralax °Senokot °Smooth move tea ° °Diarrhea: °Kaopectate °Imodium A-D ° °*NO pepto Bismol ° °Hemorrhoids: °Anusol °Anusol HC °Preparation H °Tucks ° °Indigestion: °Tums °Maalox °Mylanta °Zantac  °Pepcid ° °Insomnia: °Benadryl (alcohol free) 25mg every 6 hours as needed °Tylenol  PM °Unisom, no Gelcaps ° °Leg Cramps: °Tums °MagGel ° °Nausea/Vomiting:  °Bonine °Dramamine °Emetrol °Ginger extract °Sea bands °Meclizine  °Nausea medication to take during pregnancy:  °Unisom (doxylamine succinate 25 mg tablets) Take one tablet daily at bedtime. If symptoms are not adequately controlled, the dose can be increased to a maximum recommended dose of two tablets daily (1/2 tablet in the morning, 1/2 tablet mid-afternoon and one at bedtime). °Vitamin B6 100mg tablets. Take one tablet twice a day (up to 200 mg per day). ° °Skin Rashes: °Aveeno products °Benadryl cream or 25mg every 6 hours as needed °Calamine Lotion °1% cortisone cream ° °Yeast infection: °Gyne-lotrimin 7 °Monistat 7 ° ° °**If taking multiple medications, please check labels to avoid duplicating the same active ingredients °**take medication as directed on the label °** Do not exceed 4000 mg of tylenol in 24 hours °**Do not take medications that contain aspirin or ibuprofen ° ° ° ° °

## 2015-02-26 ENCOUNTER — Encounter (HOSPITAL_COMMUNITY): Payer: Self-pay | Admitting: Advanced Practice Midwife

## 2015-02-26 ENCOUNTER — Inpatient Hospital Stay (HOSPITAL_COMMUNITY)
Admission: AD | Admit: 2015-02-26 | Discharge: 2015-02-26 | Disposition: A | Discharge status: Home / Self Care | Payer: Self-pay | Source: Ambulatory Visit | Attending: Family Medicine | Admitting: Family Medicine

## 2015-02-26 DIAGNOSIS — R109 Unspecified abdominal pain: Secondary | ICD-10-CM

## 2015-02-26 DIAGNOSIS — Z3A08 8 weeks gestation of pregnancy: Secondary | ICD-10-CM | POA: Insufficient documentation

## 2015-02-26 DIAGNOSIS — Z3490 Encounter for supervision of normal pregnancy, unspecified, unspecified trimester: Secondary | ICD-10-CM

## 2015-02-26 DIAGNOSIS — Z87891 Personal history of nicotine dependence: Secondary | ICD-10-CM | POA: Insufficient documentation

## 2015-02-26 DIAGNOSIS — O26899 Other specified pregnancy related conditions, unspecified trimester: Secondary | ICD-10-CM

## 2015-02-26 DIAGNOSIS — O9989 Other specified diseases and conditions complicating pregnancy, childbirth and the puerperium: Secondary | ICD-10-CM

## 2015-02-26 MED ORDER — PROMETHAZINE HCL 25 MG PO TABS: 25.0000 mg | ORAL_TABLET | Freq: Once | ORAL | Status: DC

## 2015-02-26 MED ORDER — HYDROCODONE-ACETAMINOPHEN 5-325 MG PO TABS: 1.0000 | ORAL_TABLET | Freq: Once | ORAL | Status: DC

## 2015-02-26 MED ORDER — DIPHENHYDRAMINE HCL 25 MG PO CAPS
25.0000 mg | ORAL_CAPSULE | Freq: Once | ORAL | Status: AC
  Administered 2015-02-26: 25 mg via ORAL
  Filled 2015-02-26: qty 1

## 2015-02-26 MED ORDER — ACETAMINOPHEN 500 MG PO TABS
1000.0000 mg | ORAL_TABLET | Freq: Once | ORAL | Status: AC
  Administered 2015-02-26: 1000 mg via ORAL
  Filled 2015-02-26: qty 2

## 2015-02-26 NOTE — MAU Note (Addendum)
PT  SAYS SHE  STARTED HAVING  ABD  PAIN  AT .   NO MEDS  FOR  PAIN       NO PNC  STARTED .  LAST SEX-  YESTERDAY.     VOMITED   YESTERDAY- NONE  TODAY.

## 2015-02-26 NOTE — MAU Provider Note (Signed)
History     CSN: [id number]  Arrival date and time: 02/26/15 0214   First Provider Initiated Contact with Patient 02/26/15 0240      No chief complaint on file.  HPI Comments: Miranda Morrow is a 29 y.o. [id number] at [redacted]w[redacted]d who presents today with cramping. She states that the cramping started at 0000. She has not taken anything for it. She denies any vaginal bleeding. She has had some nausea and vomiting, but that has improved as well. Patient has a documented IUP on [place] on 02/09/15.   Abdominal Pain This is a new problem. The current episode started today. The onset quality is gradual. The problem occurs constantly. The problem has been gradually improving. The pain is located in the suprapubic region. The pain is at a severity of 7/10. The pain is moderate. The quality of the pain is cramping. The abdominal pain does not radiate. Associated symptoms include nausea. Pertinent negatives include no constipation, diarrhea, dysuria, fever, frequency or vomiting. Nothing aggravates the pain. The pain is relieved by nothing. She has tried nothing for the symptoms.    Past Medical History  Diagnosis Date  . Functional ovarian cysts     Past Surgical History  Procedure Laterality Date  . Colposcopy    . Cryotherapy      History reviewed. No pertinent family history.  Social History  Substance Use Topics  . Smoking status: Former Smoker    Types: Cigarettes  . Smokeless tobacco: None  . Alcohol Use: Yes    Allergies: No Known Allergies  Prescriptions prior to admission  Medication Sig Dispense Refill Last Dose  . promethazine (PHENERGAN) 25 MG tablet Take 0.5-1 tablets (12.5-25 mg total) by mouth every 6 (six) hours as needed. 30 tablet 0 02/25/2015 at 0300  . Prenatal Vit-Fe Fumarate-FA (PRENATAL COMPLETE) 14-0.4 MG TABS Take 2 tablets by mouth daily. 60 each 0 Unknown at Unknown time    Review of Systems  Constitutional: Negative for fever.  Gastrointestinal: Positive for  nausea and abdominal pain. Negative for vomiting, diarrhea and constipation.  Genitourinary: Negative for dysuria, urgency and frequency.   Physical Exam   Blood pressure 115/69, pulse 75, temperature 98.6 F (37 C), temperature source Oral, resp. rate 20, height  (1.499 m), weight 53.241 kg (117 lb 6 oz), last menstrual period 01/05/2015.  Physical Exam  Nursing note and vitals reviewed. Constitutional: She is oriented to person, place, and time. She appears well-developed and well-nourished. No distress.  HENT:  Head: Normocephalic.  Cardiovascular: Normal rate.   Respiratory: Effort normal.  GI: Soft. There is no tenderness. There is no rebound.  Neurological: She is alert and oriented to person, place, and time.  Skin: Skin is warm and dry.  Psychiatric: She has a normal mood and affect.    MAU Course  Procedures  MDM Offered patient tylenol PM here or to take home. She would like to have it here.  Patient states that her pain is mostly gone, even prior to taking the medication.   Assessment and Plan   1. Abdominal pain in pregnancy   2. Intrauterine pregnancy    DC home Comfort measures reviewed  1st Trimester precautions  Bleeding precautions RX: none, reviewed list of safe medications in pregnancy  Return to MAU as needed FU with OB as planned  Follow-up Information    Schedule an appointment as soon as possible for a visit with [facility] [place].   Contact information:   [address] [place] [address] [phone]        Tawnya Crook 02/26/2015, 2:48 AM

## 2015-02-26 NOTE — Discharge Instructions (Signed)
Prenatal Care Providers °[facility]    [facility] ° Phone- [phone]     Phone: [phone] °         °[facility]                      [facility] ° @[place]     Phone: [phone] ° Phone: [phone] °        [facility] °[facility]     Phone: [phone] ° Phone: [phone]   °        [facility] °[facility] [place]                hone: [phone] ° Phone: [phone] °        [facility] °Dr. Bernard Marshall      Phone: [phone] ° Phone: [phone] °        [facility] °[facility]                Phone: [phone] ° Women’s Health  ° Phone:[phone]    [facility] °         Phone: [phone] °[facility] °  Phone: [phone] °Safe Medications in Pregnancy  ° °Acne: °Benzoyl Peroxide °Salicylic Acid ° °Backache/Headache: °Tylenol: 2 regular strength every 4 hours OR °             2 Extra strength every 6 hours ° °Colds/Coughs/Allergies: °Benadryl (alcohol free) 25 mg every 6 hours as needed °Breath right strips °Claritin °Cepacol throat lozenges °Chloraseptic throat spray °Cold-Eeze- up to three times per day °Cough drops, alcohol free °Flonase (by prescription only) °Guaifenesin °Mucinex °Robitussin DM (plain only, alcohol free) °Saline nasal spray/drops °Sudafed (pseudoephedrine) & Actifed ** use only after [redacted] weeks gestation and if you do not have high blood pressure °Tylenol °Vicks Vaporub °Zinc lozenges °Zyrtec  ° °Constipation: °Colace °Ducolax suppositories °Fleet enema °Glycerin suppositories °Metamucil °Milk of magnesia °Miralax °Senokot °Smooth move tea ° °Diarrhea: °Kaopectate °Imodium A-D ° °*NO pepto Bismol ° °Hemorrhoids: °Anusol °Anusol HC °Preparation H °Tucks ° °Indigestion: °Tums °Maalox °Mylanta °Zantac  °Pepcid ° °Insomnia: °Benadryl (alcohol free) 25mg every 6 hours as needed °Tylenol  PM °Unisom, no Gelcaps ° °Leg Cramps: °Tums °MagGel ° °Nausea/Vomiting:  °Bonine °Dramamine °Emetrol °Ginger extract °Sea bands °Meclizine  °Nausea medication to take during pregnancy:  °Unisom (doxylamine succinate 25 mg tablets) Take one tablet daily at bedtime. If symptoms are not adequately controlled, the dose can be increased to a maximum recommended dose of two tablets daily (1/2 tablet in the morning, 1/2 tablet mid-afternoon and one at bedtime). °Vitamin B6 100mg tablets. Take one tablet twice a day (up to 200 mg per day). ° °Skin Rashes: °Aveeno products °Benadryl cream or 25mg every 6 hours as needed °Calamine Lotion °1% cortisone cream ° °Yeast infection: °Gyne-lotrimin 7 °Monistat 7 ° ° °**If taking multiple medications, please check labels to avoid duplicating the same active ingredients °**take medication as directed on the label °** Do not exceed 4000 mg of tylenol in 24 hours °**Do not take medications that contain aspirin or ibuprofen ° ° ° ° °

## 2015-03-02 ENCOUNTER — Encounter (HOSPITAL_COMMUNITY): Payer: Self-pay | Admitting: *Deleted

## 2015-03-02 ENCOUNTER — Inpatient Hospital Stay (HOSPITAL_COMMUNITY)
Admission: AD | Admit: 2015-03-02 | Discharge: 2015-03-02 | Disposition: A | Discharge status: Home / Self Care | Payer: Self-pay | Source: Ambulatory Visit | Attending: Family Medicine | Admitting: Family Medicine

## 2015-03-02 DIAGNOSIS — Z332 Encounter for elective termination of pregnancy: Secondary | ICD-10-CM

## 2015-03-02 DIAGNOSIS — R100 Acute abdomen: Secondary | ICD-10-CM

## 2015-03-02 DIAGNOSIS — Z87891 Personal history of nicotine dependence: Secondary | ICD-10-CM | POA: Insufficient documentation

## 2015-03-02 DIAGNOSIS — R109 Unspecified abdominal pain: Secondary | ICD-10-CM | POA: Insufficient documentation

## 2015-03-02 MED ORDER — OXYCODONE-ACETAMINOPHEN 5-325 MG PO TABS
2.0000 | ORAL_TABLET | Freq: Once | ORAL | Status: AC
  Administered 2015-03-02: 2 via ORAL
  Filled 2015-03-02: qty 2

## 2015-03-02 MED ORDER — PROMETHAZINE HCL 25 MG PO TABS
25.0000 mg | ORAL_TABLET | Freq: Once | ORAL | Status: AC
  Administered 2015-03-02: 25 mg via ORAL
  Filled 2015-03-02: qty 1

## 2015-03-02 MED ORDER — ONDANSETRON 8 MG PO TBDP: 8.0000 mg | ORAL_TABLET | Freq: Once | ORAL | Status: DC

## 2015-03-02 NOTE — Discharge Instructions (Signed)
Prostaglandin-Induced Abortion, Care After °Refer to this sheet in the next few weeks. These instructions provide you with information on caring for yourself after your procedure. Your health care provider may also give you more specific instructions. Your treatment has been planned according to current medical practices, but problems sometimes occur. Call your health care provider if you have any problems or questions after your procedure. °WHAT TO EXPECT AFTER THE PROCEDURE °· You may have cramps for a few days. They may feel like menstrual cramps. °· You may bleed for a few hours or a few days. It may feel like you are having a heavy period. °· You may have a headache, diarrhea, or chills for 1-2 days. °· You may feel tired and depressed. °· Your next menstrual period will most likely start 4-6 weeks after the procedure, unless your health care provider has started you on birth control pills. °HOME CARE INSTRUCTIONS  °· Only take over-the-counter or prescription medicines as directed by your health care provider. °· Only take the medicines your health care provider recommends. Do not take aspirin. It can cause bleeding. °· Keep track of how many menstrual pads you use each day and how soaked they are. Write down this information. °· Rest and avoid strenuous activity for 2-3 weeks. °· Do not have sexual intercourse for 2-3 weeks or until your health care provider says it is okay. °· Do not douche or use tampons. °· Ask your health care provider when you can start using birth control (contraception). °· Keep all your follow-up appointments. °SEEK MEDICAL CARE IF: °· You have chills or fever. °· You need to change your pad more often than every 2-4 hours. °· You continue to have pain. °· You have vaginal drainage. °· You have pain or bleeding that gets worse. °SEEK IMMEDIATE MEDICAL CARE IF:  °· You have very bad cramps in your stomach, back, or abdomen. °· You have a fever. °· There are large blood clots or tissue  coming out of your vagina. Save any tissue for your health care provider to inspect. °· You need to change your pad every hour or more than once in an hour. °· You become light-headed, weak, or faint. °MAKE SURE YOU: °· Understand these instructions. °· Will watch your condition. °· Will get help right away if you are not doing well or get worse. °Document Released: 06/24/2013 Document Reviewed: 06/24/2013 °ExitCare® Patient Information ©2015 [employer]. This information is not intended to replace advice given to you by your health care provider. Make sure you discuss any questions you have with your health care provider. ° °

## 2015-03-02 NOTE — MAU Note (Signed)
PT  WAS HERE   BEFORE-  BUT  WENT  TO [place]  TO GET  A  TAB.   HAS  TAKEN  1 PILL OF  CYTOTEC  NAD  IS  HERE  FOR  CRAMPING.  AND  VOMITING.    GAVE  RX   FOR  PERCOCET-  NOT  FILED  YET

## 2015-03-02 NOTE — MAU Provider Note (Signed)
History     CSN: [id number]  Arrival date and time: 03/02/15 0258   None     No chief complaint on file.  HPI Comments: Miranda Morrow is a 29 y.o. [id number] at [redacted]w[redacted]d who presents today with abdominal pain. She had a TAB on 03/01/15. She states that she took pills, and was told that she would not have any pain until later on. So, she did not get her prescription filled. She has not started bleeding at this time. She has a second dose to take later today.   Abdominal Pain This is a new problem. The current episode started today. The onset quality is gradual. The problem occurs constantly. The pain is located in the suprapubic region. The pain is at a severity of 10/10. The quality of the pain is cramping. The abdominal pain does not radiate. Associated symptoms include nausea and vomiting. Pertinent negatives include no constipation, diarrhea, dysuria, fever or frequency. Nothing aggravates the pain. The pain is relieved by nothing. She has tried nothing for the symptoms.    Past Medical History  Diagnosis Date  . Functional ovarian cysts     Past Surgical History  Procedure Laterality Date  . Colposcopy    . Cryotherapy      No family history on file.  Social History  Substance Use Topics  . Smoking status: Former Smoker    Types: Cigarettes  . Smokeless tobacco: None  . Alcohol Use: Yes    Allergies: No Known Allergies  Prescriptions prior to admission  Medication Sig Dispense Refill Last Dose  . promethazine (PHENERGAN) 25 MG tablet Take 0.5-1 tablets (12.5-25 mg total) by mouth every 6 (six) hours as needed. 30 tablet 0 Past Week at Unknown time  . Prenatal Vit-Fe Fumarate-FA (PRENATAL COMPLETE) 14-0.4 MG TABS Take 2 tablets by mouth daily. 60 each 0 Unknown at Unknown time    Review of Systems  Constitutional: Negative for fever.  Gastrointestinal: Positive for nausea, vomiting and abdominal pain. Negative for diarrhea and constipation.  Genitourinary: Negative  for dysuria, urgency and frequency.   Physical Exam   Blood pressure 114/53, pulse 60, temperature 98.7 F (37.1 C), temperature source Oral, resp. rate 18, last menstrual period 01/05/2015.  Physical Exam  Nursing note and vitals reviewed. Constitutional: She is oriented to person, place, and time. She appears well-developed and well-nourished. No distress.  HENT:  Head: Normocephalic.  Cardiovascular: Normal rate.   Respiratory: Effort normal.  GI: Soft. There is no tenderness.  Neurological: She is alert and oriented to person, place, and time.  Skin: Skin is warm and dry.  Psychiatric: She has a normal mood and affect.     MAU Course  Procedures  MDM   Assessment and Plan   1. Abdominal pain, acute   2. Therapeutic abortion in first trimester    DC home Reviewed with the patient that pain, nausea/vomiting and vaginal bleeding are to be expected with the process.  She declined further anti-emetics, and states that she has anti-emetics to take at home Advised patient to fill rx for pain medication, and alternate with ibuprofen.  Comfort measures reviewed  RX: none  Return to MAU as needed FU with OB as planned  Follow-up Information    Follow up with [facility] [place] [facility].   Why:  for emergencies    Contact information:   [id number] [place] [address] [phone]        Thressa Sheller  Don[place] 03/02/2015, 5:06 AM

## 2015-05-03 ENCOUNTER — Ambulatory Visit: Payer: Self-pay | Admitting: Licensed Clinical Social Worker

## 2015-05-03 ENCOUNTER — Encounter: Payer: Self-pay | Admitting: Internal Medicine

## 2015-05-07 ENCOUNTER — Ambulatory Visit (INDEPENDENT_AMBULATORY_CARE_PROVIDER_SITE_OTHER): Payer: Self-pay | Admitting: Licensed Clinical Social Worker

## 2015-05-07 DIAGNOSIS — Z639 Problem related to primary support group, unspecified: Secondary | ICD-10-CM
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
## 2015-05-14 ENCOUNTER — Other Ambulatory Visit: Payer: No Typology Code available for payment source | Admitting: Licensed Clinical Social Worker

## 2015-05-17 ENCOUNTER — Ambulatory Visit (INDEPENDENT_AMBULATORY_CARE_PROVIDER_SITE_OTHER): Payer: Self-pay | Admitting: Licensed Clinical Social Worker

## 2015-05-17 DIAGNOSIS — F320 Major depressive disorder, single episode, mild: Secondary | ICD-10-CM

## 2015-05-18 NOTE — Progress Notes (Signed)
   THERAPY PROGRESS NOTE  Session Time: 60 minutes  Participation Level: Active  Behavioral Response: Casual and Well GroomedAlertEuthymic  Type of Therapy: Individual Therapy  Treatment Goals addressed: Communication: Forgiveness, expressing stress and frustration  Interventions: Strength-based and Supportive  Summary: Miranda Morrow is a 29 y.o. female who presents with a positive mood and appropriate affect. Miranda Morrow shared that she was feeling extremely hopeful and excited because she had heard from her DSS social worker that she may be able to get her children back in her custody as early as this week. Miranda Morrow and [name] discussed the impact that this would have on her life. Miranda Morrow reported that she would "have her life back." [name] and Miranda Morrow processed her plan so that she will not leave her children alone in the house again; Miranda Morrow reported that she will have daycare vouchers so that she does not have to depend on neighbors or babysitters for childcare. She confirmed her commitment to never leave her children alone, even for a short amount of time. She expressed that while losing her children temporarily has been painful, she feels that something good can come out of it. Miranda Morrow participated in creating an Ecomap, and listed her most important support persons and life domains as her 4 children, her parents, work, DSS, her boyfriend, and her lifelong search for love. Miranda Morrow shared in depth about each life domain; she reported that her relationship with her three younger children is very good but that she is stressed by her relationship with her oldest daughter, who is not in her custody. She also reported stress with her mother, DSS, and her boyfriend. She reported a very positive relationship with her father.   Suicidal/Homicidal: Nowithout intent/plan  Therapist Response: [name] utilized Motivational Interviewing throughout the session in order to continue building rapport and affirm [name]'s  feelings. [name] reflected on [name]'s happiness and hope in perhaps regaining custody of her children sooner than she thought. [name] engaged Miranda Morrow in [name] intervention in order to assess positive, ambivalent, and stressful relationships. [name] reflected on how [name]'s lifetime search for love has resulted in both positive and negative relationships.  Plan: Return again in 1 weeks.  Diagnosis: Axis I: Major Depression, single episode    Axis II: No diagnosis    Nilda Simmeratosha Warnie Belair, [name] 05/18/2015

## 2015-05-21 ENCOUNTER — Ambulatory Visit (INDEPENDENT_AMBULATORY_CARE_PROVIDER_SITE_OTHER): Payer: Self-pay | Admitting: Licensed Clinical Social Worker

## 2015-05-21 ENCOUNTER — Ambulatory Visit: Payer: No Typology Code available for payment source | Admitting: Internal Medicine

## 2015-05-21 DIAGNOSIS — F320 Major depressive disorder, single episode, mild: Secondary | ICD-10-CM
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
## 2015-05-24 ENCOUNTER — Ambulatory Visit: Payer: No Typology Code available for payment source | Admitting: Internal Medicine

## 2015-06-04 ENCOUNTER — Ambulatory Visit (INDEPENDENT_AMBULATORY_CARE_PROVIDER_SITE_OTHER): Payer: Self-pay | Admitting: Licensed Clinical Social Worker

## 2015-06-04 DIAGNOSIS — F320 Major depressive disorder, single episode, mild: Secondary | ICD-10-CM

## 2015-06-04 NOTE — Progress Notes (Signed)
   THERAPY PROGRESS NOTE  Session Time: 45min  Participation Level: Active  Behavioral Response: Casual and Well GroomedAlertEuthymic  Type of Therapy: Individual Therapy  Treatment Goals addressed: Coping  Interventions: Supportive  Summary: Miranda Morrow is a 29 y.o. female who presents with a positive mood and appropriate affect. Miranda Morrow expressed joy that she had finally regained custody of her three children. She shared that she had been able to pick them up earlier this week. She reported that they were attending a new daycare and slowly adjusting. She shared that she felt happy and "back to normal" although it was somewhat tiring and stressful to have them back. She expressed frustration that her CPS social worker had said the case may stay open for up to a year. Miranda Morrow reported that she had found a new apartment and that they would be moving on Monday. She appeared excited about the move. Miranda Morrow shared that her relationship with her boyfriend had "gone back to normal" but that she wanted to break up with him as soon as she was able to buy a car. She reported that she recently started talking with her mother again following a conflict. Miranda Morrow expressed concern that she had just learned that her mother would be undergoing heart surgery soon. She completed the PHQ-9 and scored a 5, indicating mild depressive symptoms. Miranda Morrow reported that she had very little appetite last week and barely ate; she attributed the appetite change to stress. She reported that she has been eating normally since she got her children back this week.   Suicidal/Homicidal: Nowithout intent/plan  Therapist Response: [name] used supportive counseling skills throughout the session in order to validate Britani's feelings and encourage open expression of emotion. [name] provided affirmations to Miranda Morrow for getting custody of her children back and also finding a new apartment after a long search. [name] engaged Miranda Morrow in the  PHQ-9 measure in order to assess her current level of depressive symptoms, which were mild.   Plan: Return again in 1 weeks.  Diagnosis: Axis I: Major Depression, single episode    Axis II: No diagnosis    Nilda Simmeratosha Meelah Tallo, [name] 06/04/2015

## 2015-06-11 ENCOUNTER — Ambulatory Visit (INDEPENDENT_AMBULATORY_CARE_PROVIDER_SITE_OTHER): Payer: Self-pay | Admitting: Licensed Clinical Social Worker

## 2015-06-11 DIAGNOSIS — F320 Major depressive disorder, single episode, mild: Secondary | ICD-10-CM

## 2015-06-11 DIAGNOSIS — Z639 Problem related to primary support group, unspecified: Secondary | ICD-10-CM

## 2015-06-11 NOTE — Progress Notes (Signed)
   THERAPY PROGRESS NOTE  Session Time: 60 minutes  Participation Level: Active  Behavioral Response: Casual and Well GroomedAlertDepressed  Type of Therapy: Individual Therapy  Treatment Goals addressed: Coping  Interventions: DBT and Supportive  Summary: Miranda Morrow is a 29 y.o. female who presents with a depressed mood and appropriate affect. Miranda Morrow reported that she is still getting used to having her three children back in her custody, especially given her work schedule. 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 She reported that she had moved all of her belongings to a new apartment and was just waiting for the landlord to complete a job in the bathroom before she and her children move in this weekend. She expressed being proud of herself for getting her children back, finding a new place to live, and saving up to buy a car. Miranda Morrow shared about her stress in regards to her boyfriend's legal status. She reported that they continue to argue a lot and that she is planning on breaking up with him as soon as she has a new car. Miranda Morrow engaged in the guided imagery activity and shared afterwards that her imagined safe space was the hospital; she shared that in the hospital while giving birth to her children, she always felt safe and that "now there would be someone who would always love me." Miranda Morrow expressed that during the guided imagery, she began thinking about several things that had hurt her: the way her mother treated her during her childhood, her arguments with her boyfriend, and the abortion she had two months ago. She reported that in the meditation, she imagined herself "pulling them into her circle" so that she could forgive them. She appeared receptive to [name] feedback about how forgiveness can be difficult but could also lift a burden off her. Miranda Morrow shared about her recent abortion and its impact on her emotional state. She reported that while it was the right  decision for her, it is still painful. She shared about her continued feelings of resentment towards her mother for putting her in foster care intermittently as a child; she expressed pride in breaking the cycle of emotional abuse in her family.  Suicidal/Homicidal: Nowithout intent/plan  Therapist Response: [name] checked in with Miranda Morrow regarding her life and current stressors now that she has her children back in her custody. [name] provided affirmations to Miranda Morrow for meeting goals and being consistent. [name] engaged her in a Dialectical Behavioral Therapy intervention: guided imagery. [name] guided Miranda Morrow through progressive muscle relaxation, deep breathing, and imagery focused on forgiveness and healing. [name] asked Miranda Morrow to imagine herself forgiving those who have hurt her, in order to release the pain and anger inside of herself. [name] and Miranda Morrow processed about her experience during the guided meditation. [name] reflected on the difficulty for her to make the decision to have an abortion. [name] encouraged her to allow herself the chance to grieve the loss.  Plan: Return again in 1 weeks.  Diagnosis: Axis I: Major Depression, single episode    Axis II: No diagnosis    Nilda Simmeratosha Jatia Musa, [name] 06/11/2015

## 2015-06-18 ENCOUNTER — Other Ambulatory Visit: Payer: No Typology Code available for payment source | Admitting: Licensed Clinical Social Worker

## 2015-06-23 ENCOUNTER — Other Ambulatory Visit: Payer: No Typology Code available for payment source | Admitting: Licensed Clinical Social Worker

## 2015-07-08 ENCOUNTER — Other Ambulatory Visit: Payer: No Typology Code available for payment source | Admitting: Licensed Clinical Social Worker

## 2015-07-16 ENCOUNTER — Ambulatory Visit (INDEPENDENT_AMBULATORY_CARE_PROVIDER_SITE_OTHER): Payer: Self-pay | Admitting: Licensed Clinical Social Worker

## 2015-07-16 DIAGNOSIS — F320 Major depressive disorder, single episode, mild: Secondary | ICD-10-CM

## 2015-07-16 NOTE — Progress Notes (Signed)
   THERAPY PROGRESS NOTE  Session Time: 60min  Participation Level: Active  Behavioral Response: Casual and Well GroomedAlertEuthymic  Type of Therapy: Individual Therapy  Treatment Goals addressed: Coping  Interventions: Supportive  Summary: Miranda Morrow is a 30 y.o. female who presents with a positive mood and appropriate affect. She reported that she enjoyed the holidays, as did her children. She shared that she was completely moved into her new apartment and that the landlord had finally finished fixing everything. She reported that she had started a new part-time job at [employer] while continuing her full-time job. She reported that she is only sleeping 3-4 hours but that she needs the money. She shared that she is tired but that she is "doing it." Miranda Morrow shared about her recent child and family team meeting at DSS; she expressed satisfaction that the meeting was very positive and that her case got downgraded from high risk to low. She expressed nervousness that her court date for the police charges for the incident is this Tuesday. Miranda Morrow shared about the relationship with her boyfriend, which she reported was going okay but that she is "over it." She processed about the kinds of men that she is typically attracted to and why they are not good partners. She shared about some of the qualities that she would like in a future partner. Miranda LushAndrea and [name] reviewed [name]'s goals in order to measure progress and assess her needs. Miranda Morrow reported that she felt she was doing a better job with communication and being able to express her anger and frustration. She shared about a time recently when she wanted to curse out her landlord but was able to state her needs in a text message instead. She reported that she and her mother are getting along better lately. She shared that on a scale of 1 to 10 for self-love/self-esteem, she is at a 7.   Suicidal/Homicidal: Nowithout intent/plan  Therapist  Response: [name] provided supportive counseling techniques throughout the session. [name] reflected as Miranda Morrow shared about her current stressors and emotions. [name] encouraged Miranda Morrow to find a balance between work, family time, and sleep so that she does not burn herself out. [name] and Miranda Morrow processed about her feelings regarding her DSS case and her upcoming court date. [name] asked Miranda Morrow to identify qualities that have made her boyfriends not be good partners, and to think through the qualities she would like in a boyfriend. [name] reminded Miranda Morrow that she had to love herself so that she could find a partner to love her as well.  Plan: Return again in 1 weeks.  Diagnosis: Axis I: Major Depression, single episode    Axis II: No diagnosis    Nilda Simmeratosha Alfonzia Woolum, [name] 07/16/2015

## 2015-07-23 ENCOUNTER — Other Ambulatory Visit: Payer: No Typology Code available for payment source | Admitting: Licensed Clinical Social Worker

## 2015-07-30 ENCOUNTER — Ambulatory Visit (INDEPENDENT_AMBULATORY_CARE_PROVIDER_SITE_OTHER): Payer: Self-pay | Admitting: Licensed Clinical Social Worker

## 2015-07-30 DIAGNOSIS — F320 Major depressive disorder, single episode, mild: Secondary | ICD-10-CM

## 2015-08-02 NOTE — Progress Notes (Signed)
   THERAPY PROGRESS NOTE  Session Time:  Participation Level: Active  Behavioral Response: Casual and Well GroomedAlertEuthymic  Type of Therapy: Individual Therapy  Treatment Goals addressed: Coping  Interventions: Supportive  Summary: Miranda Morrow is a 30 y.o. female who presents with a positive mood and appropriate affect. [name] shared that she has been doing well overall and that there is currently only one major stressor in her life: a new DSS case. She reported that her older daughter was caught at school writing notes of a sexual nature and that a report was made to DSS. [name] expressed that she feels her daughter is acting out for attention because she is having a hard time with the transition from her father's house back to her mother's. [name] and [name] processed about how [name] can respond to her daughter's behaviors. [name] appeared receptive to [employer] feedback about responding with patience and structure as her daughter processes through her feelings. She reported that her DSS social worker had closed the second case because the understood the context for the daughter's behaviors. [name] reported that she has continued to work at her second part-time job and has now saved up enough money for a car. She shared that she was searching for a car and that she continues to plan to break up with her boyfriend once she has the car. [name] had to bring her 57-year-old son to the appointment; he was very disruptive and the appointment had to end early.     Suicidal/Homicidal: Nowithout intent/plan  Therapist Response: [name] used supportive counseling techniques to check in with Tinaya regarding her current stressors. [name] normalized [name]'s daughter's behaviors in the context of the immense changes in her life. [name] encouraged [name] to be patient and open with her daughter as she re-adjusts to living with [name]. [name] and [name] processed about what her life might look like once she  has a car and breaks up with her boyfriend.  Plan: Return again in 1 weeks.  Diagnosis: Axis I: Major Depression, single episode    Axis II: No diagnosis    [name], [name] 08/02/2015

## 2015-08-06 ENCOUNTER — Encounter: Payer: Self-pay | Admitting: Internal Medicine

## 2015-08-06 ENCOUNTER — Ambulatory Visit (INDEPENDENT_AMBULATORY_CARE_PROVIDER_SITE_OTHER): Payer: Self-pay | Admitting: Internal Medicine

## 2015-08-06 ENCOUNTER — Ambulatory Visit (INDEPENDENT_AMBULATORY_CARE_PROVIDER_SITE_OTHER): Payer: Self-pay | Admitting: Licensed Clinical Social Worker

## 2015-08-06 VITALS — BP 122/78 | HR 76 | Ht 60.0 in | Wt 126.0 lb

## 2015-08-06 DIAGNOSIS — F320 Major depressive disorder, single episode, mild: Secondary | ICD-10-CM

## 2015-08-06 DIAGNOSIS — K0889 Other specified disorders of teeth and supporting structures: Secondary | ICD-10-CM

## 2015-08-06 DIAGNOSIS — H547 Unspecified visual loss: Secondary | ICD-10-CM

## 2015-08-06 NOTE — Patient Instructions (Signed)
Can google "advance directives, [place]"  And bring up form from Secretary of State. Print and fill out Or can go to "5 wishes"  Which is also in Spanish and fill out--this costs $5--perhaps easier to use. Designate a Medical Power of Attorney to speak for you if you are unable to speak for yourself when ill or injured  

## 2015-08-06 NOTE — Progress Notes (Signed)
   Subjective:    Patient ID: Miranda Morrow, female    DOB: 1985/11/12, 30 y.o.   MRN: [id number]  HPI  1.  Problems with eye strain and subsequent headache.  Notes especially with computer work.  Has particularly noted in past 2 weeks.  Has worn glasses before.  Has been 8 years since the glasses broke and never returned to replace.  2.  Dental pain with right lower teeth when brushes teeth twice daily.  Flossing does not seem to cause a problem.    3.  Body feels sore today.  Not feeling ill--feels like just sore muscles.  Did start a new job recently as above.      Review of Systems     Objective:   Physical Exam NAD--moves about fine. HEENT:  PERRL, EOMI, discs sharp, TMs pearly gray, throat without injection.  Appears to have a crack in outside aspect of right lower molar.  No swelling or redness of gingiva Neck:  Supple, no adenopathy Chest:  CTA CV:  RRR with grade II/VI systolic murmur LUSB.  Radial pulses normal and equal.        Assessment & Plan:  1.  Decreased Visual acuity:  Referral to Optometry through [facility] orange card  2.  Dental pain:  Referral to dental.  Handout also for [facility] dental hygienist school prices for cleaning, etc.  3.  Heart murmur:  Pt. States this is known.  Will send for her records from [place].  To return for CPE.  Had pap in August.  Thinks Tdap up to date.  Refuses influenza vaccine.

## 2015-08-06 NOTE — Progress Notes (Signed)
   THERAPY PROGRESS NOTE  Session Time:  Participation Level: Active  Behavioral Response: Neat and Well GroomedDrowsyEuthymic  Type of Therapy: Individual Therapy  Treatment Goals addressed: Coping  Interventions: Supportive  Summary: Miranda Morrow is a 30 y.o. female who presents with a positive mood and appropriate, though very fatigued. [name] reported that working two jobs and being a single parent continues to be tiring, as she typically gets less than 5 hours of sleep per night. She reported that she is able to keep going despite her fatigue because "there's no other choice." She shared that despite her constant level of fatigue, she does not feel particularly stressed currently. She reported that her only stressor is having to go to so many appointments for herself and her children without having a car of her own. She shared about an argument that she had two days ago with her boyfriend because he was acting immaturely towards her. She reported that she did not engage in much self-care and did not appear receptive to [name] feedback about the importance of self-care. She shared that a friend had committed suicide two days ago and that she has been feeling sad about it. She reported that she did not feel sad when taking care of her children, but that any time she was alone, she would cry. She shared about what her friend had been like. She expressed that the death had made her realize she needed to spend more time with her family, especially family members who may be struggling.   Suicidal/Homicidal: Nowithout intent/plan  Therapist Response: [name] used supportive counseling techniques throughout the session. [name] checked in with [name] regarding her mood and current stress level. [name] emphasized the importance of self-care and encouraged [name] to take even a short time to do nice things for herself. [name] and [name] processed about her grief in regards to her friend who committed  suicide. [name] and [name] discussed her current treatment needs; [name] and [name] decided to meet every other week due to [name]'s low stress level.  Plan: Return again in 2 weeks.  Diagnosis: Axis I: Major Depression, single episode    Axis II: No diagnosis    [name], [name] 08/06/2015

## 2015-08-12 ENCOUNTER — Encounter (HOSPITAL_COMMUNITY): Payer: Self-pay

## 2015-08-12 ENCOUNTER — Emergency Department (HOSPITAL_COMMUNITY)
Admission: EM | Admit: 2015-08-12 | Discharge: 2015-08-12 | Disposition: A | Discharge status: Home / Self Care | Payer: No Typology Code available for payment source | Attending: Emergency Medicine | Admitting: Emergency Medicine

## 2015-08-12 DIAGNOSIS — Z87891 Personal history of nicotine dependence: Secondary | ICD-10-CM | POA: Insufficient documentation

## 2015-08-12 DIAGNOSIS — J069 Acute upper respiratory infection, unspecified: Secondary | ICD-10-CM

## 2015-08-12 DIAGNOSIS — R040 Epistaxis: Secondary | ICD-10-CM | POA: Insufficient documentation

## 2015-08-12 NOTE — Discharge Instructions (Signed)
Humidify the air and apply bacitracin to the inside of the nose this will help prevent future nosebleeds. If the bleeding recurs spray Afrin into the nose and hold pressure for at least 10 minutes. ° °Please follow with your primary care doctor in the next 2 days for a check-up. They must obtain records for further management.  ° °Do not hesitate to return to the Emergency Department for any new, worsening or concerning symptoms.  ° ° °Nosebleed °Nosebleeds are common. They are due to a crack in the inside lining of your nose (mucous membrane) or from a small blood vessel that starts to bleed. Nosebleeds can be caused by many conditions, such as injury, infections, dry mucous membranes or dry climate, medicines, nose picking, and home heating and cooling systems. Most nosebleeds come from blood vessels in the front of your nose. °HOME CARE INSTRUCTIONS  °· Try controlling your nosebleed by pinching your nostrils gently and continuously for at least 10 minutes. °· Avoid blowing or sniffing your nose for a number of hours after having a nosebleed. °· Do not put gauze inside your nose yourself. If your nose was packed by your health care provider, try to maintain the pack inside of your nose until your health care provider removes it. °¨ If a gauze pack was used and it starts to fall out, gently replace it or cut off the end of it. °¨ If a balloon catheter was used to pack your nose, do not cut or remove it unless your health care provider has instructed you to do that. °· Avoid lying down while you are having a nosebleed. Sit up and lean forward. °· Use a nasal spray decongestant to help with a nosebleed as directed by your health care provider. °· Do not use petroleum jelly or mineral oil in your nose. These can drip into your lungs. °· Maintain humidity in your home by using less air conditioning or by using a humidifier. °· Aspirin and blood thinners make bleeding more likely. If you are prescribed these medicines  and you suffer from nosebleeds, ask your health care provider if you should stop taking the medicines or adjust the dose. Do not stop medicines unless directed by your health care provider °· Resume your normal activities as you are able, but avoid straining, lifting, or bending at the waist for several days. °· If your nosebleed was caused by dry mucous membranes, use over-the-counter saline nasal spray or gel. This will keep the mucous membranes moist and allow them to heal. If you must use a lubricant, choose the water-soluble variety. Use it only sparingly, and do not use it within several hours of lying down. °· Keep all follow-up visits as directed by your health care provider. This is important. °SEEK MEDICAL CARE IF: °· You have a fever. °· You get frequent nosebleeds. °· You are getting nosebleeds more often. °SEEK IMMEDIATE MEDICAL CARE IF: °· Your nosebleed lasts longer than 20 minutes. °· Your nosebleed occurs after an injury to your face, and your nose looks crooked or broken. °· You have unusual bleeding from other parts of your body. °· You have unusual bruising on other parts of your body. °· You feel light-headed or you faint. °· You become sweaty. °· You vomit blood. °· Your nosebleed occurs after a head injury. °  °This information is not intended to replace advice given to you by your health care provider. Make sure you discuss any questions you have with your health care provider. °  °  Document Released: 03/29/2005 Document Revised: 07/10/2014 Document Reviewed: 02/02/2014 °Elsevier Interactive Patient Education ©2016 [employer] ° °

## 2015-08-12 NOTE — ED Notes (Signed)
Patient here with congestion and cough x 1 day, yesterday concerned because she had nosebleed from right nare, reports nausea with same

## 2015-08-12 NOTE — ED Provider Notes (Signed)
CSN: [id number]     Arrival date & time 08/12/15  1021 History  By signing my name below, I, Miranda Morrow, attest that this documentation has been prepared under the direction and in the presence of Wynetta Emery, PA-C Electronically Signed: Charline Bills, ED Scribe 08/12/2015 at 11:33 AM.   Chief Complaint  Patient presents with  . Nasal Congestion   The history is provided by the patient. No language interpreter was used.   HPI Comments: Miranda Morrow is a 30 y.o. female who presents to the Emergency Department complaining of persistent nasal congestion onset yesterday morning. Pt reports associated rhinorrhea, 7 nosebleeds since yesterday morning, cough. No treatments tried PTA. She denies fever and any other symptoms at this time.   Past Medical History  Diagnosis Date  . Functional ovarian cysts   . Abnormal Pap smear of cervix 2006    underwent colposcopy followed by    Past Surgical History  Procedure Laterality Date  . Colposcopy  2006  . Cryotherapy  2006   Family History  Problem Relation Age of Onset  . Hypertension Mother   . Heart disease Mother     also has pacemaker/AICD  . Congestive Heart Failure Mother    Social History  Substance Use Topics  . Smoking status: Former Smoker -- 3 years    Types: Cigarettes  . Smokeless tobacco: Never Used  . Alcohol Use: No   OB History    Gravida Para Term Preterm AB TAB SAB Ectopic Multiple Living   0 2 0 2 0 0 4     Review of Systems A complete 10 system review of systems was obtained and all systems are negative except as noted in the HPI and PMH.   Allergies  Review of patient's allergies indicates no known allergies.  Home Medications   Prior to Admission medications   Not on File   BP 123/86 mmHg  Pulse 96  Temp(Src) 98.3 F (36.8 C) (Oral)  Resp 16  Ht 5' (1.524 m)  Wt 123 lb (55.792 kg)  BMI 24.02 kg/m2  SpO2 100%  LMP 07/27/2015 Physical Exam  Constitutional: She is oriented to  person, place, and time. She appears well-developed and well-nourished. No distress.  Very strong smell of marijuana  HENT:  Head: Normocephalic and atraumatic.  Right Ear: External ear normal.  Left Ear: External ear normal.  Mouth/Throat: Oropharynx is clear and moist. No oropharyngeal exudate.  No drooling or stridor. Posterior pharynx mildly erythematous no significant tonsillar hypertrophy. No exudate. Soft palate rises symmetrically. No TTP or induration under tongue.   No tenderness to palpation of frontal or bilateral maxillary sinuses.  Mild mucosal edema in the nares with scant rhinorrhea.  Bilateral tympanic membranes with normal architecture and good light reflex.    Eyes: Conjunctivae and EOM are normal. Pupils are equal, round, and reactive to light.  Neck: Normal range of motion. Neck supple. No tracheal deviation present.  Cardiovascular: Normal rate and regular rhythm.   Pulmonary/Chest: Effort normal and breath sounds normal. No stridor. No respiratory distress. She has no wheezes. She has no rales. She exhibits no tenderness.  Abdominal: Soft. There is no tenderness. There is no rebound and no guarding.  Musculoskeletal: Normal range of motion.  Neurological: She is alert and oriented to person, place, and time.  Skin: Skin is warm and dry.  Psychiatric: [id number] a normal mood and affect. Her behavior is normal.  Nursing note and vitals reviewed.  ED Course  Procedures (including critical care time) DIAGNOSTIC STUDIES: Oxygen Saturation is 100% on RA, normal by my interpretation.    COORDINATION OF CARE: 11:30 AM-Discussed treatment plan with pt at bedside and pt agreed to plan.   Labs Review Labs Reviewed - No data to display  Imaging Review No results found.   EKG Interpretation None      MDM   Final diagnoses:  Epistaxis  URI (upper respiratory infection)    Filed Vitals:   08/12/15 1026  BP: 123/86  Pulse: 96  Temp: 98.3 F (36.8 C)   TempSrc: Oral  Resp: 16  Height: 5' (1.524 m)  Weight: 55.792 kg  SpO2: 100%    Miranda Morrow is 30 y.o. female presenting with resolved epistaxis or nasal discharge. No active bleeding. AFVSS, LSCTA, doubt pneumonia, likely viral URI. Counseled patient on how to control the nosebleed if it were to recur.  Evaluation does not show pathology that would require ongoing emergent intervention or inpatient treatment. Pt is hemodynamically stable and mentating appropriately. Discussed findings and plan with patient/guardian, who agrees with care plan. All questions answered. Return precautions discussed and outpatient follow up given.    I personally performed the services described in this documentation, which was scribed in my presence. The recorded information has been reviewed and is accurate.    Wynetta Emery, PA-C 08/12/15 1138  Arby Barrette, MD 08/13/15 [phone]

## 2015-08-27 ENCOUNTER — Other Ambulatory Visit: Payer: No Typology Code available for payment source | Admitting: Licensed Clinical Social Worker

## 2015-09-03 ENCOUNTER — Ambulatory Visit (INDEPENDENT_AMBULATORY_CARE_PROVIDER_SITE_OTHER): Payer: Self-pay | Admitting: Licensed Clinical Social Worker

## 2015-09-03 DIAGNOSIS — F320 Major depressive disorder, single episode, mild: Secondary | ICD-10-CM

## 2015-09-03 NOTE — Progress Notes (Signed)
   THERAPY PROGRESS NOTE  Session Time: 30min  Participation Level: Active  Behavioral Response: CasualAlertEuthymic  Type of Therapy: Individual Therapy  Treatment Goals addressed: Coping  Interventions: CBT and Supportive  Summary: Miranda Morrow is a 30 y.o. female who presents with a positive mood and appropriate affect. She reported with excitement and pride that she had finally been able to purchase a car. She shared that one of her DSS cases was being closed but that the other would stay open for a few months so that she could continue to receive assistance for daycare. Miranda Morrow reported that she had been offered a new job that pays more than her current day job, so she would be changing jobs in a week or two. She shared that her main stressor currently is that her children will not listen to her; she identified this problem as within limits of age-appropriate behavior but noted that because she has three children, it can feel overwhelming. Miranda Morrow participated in drawing activity called "I believe (about me) that..." She reflected on her strengths and core beliefs, both negative and positive, and identified her two main core beliefs as "I'm strong," and "I'm happy." Miranda Morrow and [name] processed about these core beliefs and what life events had helped to shape them. Miranda Morrow shared that she feels it is the adversity in her life that has caused her to stay strong and positive.  Suicidal/Homicidal: Nowithout intent/plan  Therapist Response: [name] utilized supportive counseling techniques throughout the session in order to validate emotions and encourage open expression of emotion. [name] engaged Miranda Morrow in [employer]ognitive Behavioral Therapy intervention of reflecting on and identifying her core beliefs about herself.  Plan: Return again in 2 weeks.  Diagnosis: Axis I: See current hospital problem list    Axis II: No diagnosis    Miranda Simmeratosha Isiaha Greenup, [name] 09/03/2015

## 2015-09-17 ENCOUNTER — Ambulatory Visit (INDEPENDENT_AMBULATORY_CARE_PROVIDER_SITE_OTHER): Payer: Self-pay | Admitting: Licensed Clinical Social Worker

## 2015-09-17 DIAGNOSIS — F320 Major depressive disorder, single episode, mild: Secondary | ICD-10-CM
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
## 2015-10-01 ENCOUNTER — Other Ambulatory Visit: Payer: No Typology Code available for payment source | Admitting: Licensed Clinical Social Worker

## 2015-10-08 ENCOUNTER — Other Ambulatory Visit: Payer: No Typology Code available for payment source | Admitting: Licensed Clinical Social Worker

## 2015-10-22 ENCOUNTER — Other Ambulatory Visit: Payer: No Typology Code available for payment source | Admitting: Licensed Clinical Social Worker

## 2015-11-02 ENCOUNTER — Emergency Department (HOSPITAL_COMMUNITY)
Admission: EM | Admit: 2015-11-02 | Discharge: 2015-11-02 | Disposition: A | Discharge status: Home / Self Care | Payer: No Typology Code available for payment source | Attending: Emergency Medicine | Admitting: Emergency Medicine

## 2015-11-02 ENCOUNTER — Encounter (HOSPITAL_COMMUNITY): Payer: Self-pay | Admitting: *Deleted

## 2015-11-02 ENCOUNTER — Emergency Department (HOSPITAL_COMMUNITY): Payer: No Typology Code available for payment source

## 2015-11-02 ENCOUNTER — Encounter: Payer: No Typology Code available for payment source | Admitting: Internal Medicine

## 2015-11-02 DIAGNOSIS — Z8742 Personal history of other diseases of the female genital tract: Secondary | ICD-10-CM | POA: Insufficient documentation

## 2015-11-02 DIAGNOSIS — S41012A Laceration without foreign body of left shoulder, initial encounter: Secondary | ICD-10-CM | POA: Insufficient documentation

## 2015-11-02 DIAGNOSIS — S79922A Unspecified injury of left thigh, initial encounter: Secondary | ICD-10-CM | POA: Insufficient documentation

## 2015-11-02 DIAGNOSIS — Y998 Other external cause status: Secondary | ICD-10-CM | POA: Diagnosis not present

## 2015-11-02 DIAGNOSIS — Z87891 Personal history of nicotine dependence: Secondary | ICD-10-CM | POA: Insufficient documentation

## 2015-11-02 DIAGNOSIS — Z23 Encounter for immunization: Secondary | ICD-10-CM | POA: Diagnosis not present

## 2015-11-02 DIAGNOSIS — S3992XA Unspecified injury of lower back, initial encounter: Secondary | ICD-10-CM | POA: Insufficient documentation

## 2015-11-02 DIAGNOSIS — Y92410 Unspecified street and highway as the place of occurrence of the external cause: Secondary | ICD-10-CM | POA: Insufficient documentation

## 2015-11-02 DIAGNOSIS — Y9389 Activity, other specified: Secondary | ICD-10-CM | POA: Diagnosis not present

## 2015-11-02 DIAGNOSIS — S4992XA Unspecified injury of left shoulder and upper arm, initial encounter: Secondary | ICD-10-CM | POA: Diagnosis present

## 2015-11-02 MED ORDER — TETANUS-DIPHTH-ACELL PERTUSSIS 5-2.5-18.5 LF-MCG/0.5 IM SUSP
0.5000 mL | Freq: Once | INTRAMUSCULAR | Status: AC
  Administered 2015-11-02: 0.5 mL via INTRAMUSCULAR
  Filled 2015-11-02: qty 0.5

## 2015-11-02 MED ORDER — ACETAMINOPHEN 325 MG PO TABS
650.0000 mg | ORAL_TABLET | Freq: Once | ORAL | Status: AC
  Administered 2015-11-02: 650 mg via ORAL
  Filled 2015-11-02: qty 2
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
## 2016-06-11 ENCOUNTER — Emergency Department (HOSPITAL_COMMUNITY)
Admission: EM | Admit: 2016-06-11 | Discharge: 2016-06-11 | Disposition: A | Discharge status: Home / Self Care | Payer: Medicaid Other | Attending: Emergency Medicine | Admitting: Emergency Medicine

## 2016-06-11 ENCOUNTER — Emergency Department (HOSPITAL_COMMUNITY): Payer: Medicaid Other

## 2016-06-11 DIAGNOSIS — R112 Nausea with vomiting, unspecified: Secondary | ICD-10-CM | POA: Diagnosis not present

## 2016-06-11 DIAGNOSIS — K529 Noninfective gastroenteritis and colitis, unspecified: Secondary | ICD-10-CM | POA: Insufficient documentation

## 2016-06-11 DIAGNOSIS — R197 Diarrhea, unspecified: Secondary | ICD-10-CM

## 2016-06-11 DIAGNOSIS — Z87891 Personal history of nicotine dependence: Secondary | ICD-10-CM | POA: Diagnosis not present

## 2016-06-11 DIAGNOSIS — R1030 Lower abdominal pain, unspecified: Secondary | ICD-10-CM | POA: Diagnosis present

## 2016-06-11 LAB — CBC WITH DIFFERENTIAL/PLATELET
Basophils Absolute: 0.1 10*3/uL (ref 0.0–0.1)
Basophils Relative: 0 %
Eosinophils Absolute: 0.1 10*3/uL (ref 0.0–0.7)
Eosinophils Relative: 0 %
HCT: 37.6 % (ref 36.0–46.0)
Hemoglobin: 12.5 g/dL (ref 12.0–15.0)
LYMPHS ABS: 3.1 10*3/uL (ref 0.7–4.0)
Lymphocytes Relative: 14 %
MCH: 25.6 pg — ABNORMAL LOW (ref 26.0–34.0)
MCHC: 33.2 g/dL (ref 30.0–36.0)
MCV: 76.9 fL — AB (ref 78.0–100.0)
MONO ABS: 0.7 10*3/uL (ref 0.1–1.0)
Monocytes Relative: 3 %
Neutro Abs: 18 10*3/uL — ABNORMAL HIGH (ref 1.7–7.7)
Neutrophils Relative %: 83 %
PLATELETS: 266 10*3/uL (ref 150–400)
RBC: 4.89 MIL/uL (ref 3.87–5.11)
RDW: 13.9 % (ref 11.5–15.5)
WBC: 21.9 10*3/uL — AB (ref 4.0–10.5)

## 2016-06-11 LAB — I-STAT CHEM 8, ED
BUN: 9 mg/dL (ref 6–20)
CALCIUM ION: 0.98 mmol/L — AB (ref 1.15–1.40)
CREATININE: 0.5 mg/dL (ref 0.44–1.00)
Chloride: 109 mmol/L (ref 101–111)
Glucose, Bld: 123 mg/dL — ABNORMAL HIGH (ref 65–99)
HCT: 39 % (ref 36.0–46.0)
Hemoglobin: 13.3 g/dL (ref 12.0–15.0)
Potassium: 4.9 mmol/L (ref 3.5–5.1)
Sodium: 138 mmol/L (ref 135–145)
TCO2: 21 mmol/L (ref 0–100)

## 2016-06-11 LAB — COMPREHENSIVE METABOLIC PANEL
ALT: 25 U/L (ref 14–54)
AST: 40 U/L (ref 15–41)
Albumin: 4.1 g/dL (ref 3.5–5.0)
Alkaline Phosphatase: 39 U/L (ref 38–126)
Anion gap: 15 (ref 5–15)
BILIRUBIN TOTAL: 0.6 mg/dL (ref 0.3–1.2)
BUN: 7 mg/dL (ref 6–20)
CO2: 17 mmol/L — ABNORMAL LOW (ref 22–32)
CREATININE: 0.78 mg/dL (ref 0.44–1.00)
Calcium: 9.3 mg/dL (ref 8.9–10.3)
Chloride: 106 mmol/L (ref 101–111)
GFR calc Af Amer: >60 mL/min (ref >60)
Glucose, Bld: 115 mg/dL — ABNORMAL HIGH (ref 65–99)
Potassium: 3.5 mmol/L (ref 3.5–5.1)
Sodium: 138 mmol/L (ref 135–145)
TOTAL PROTEIN: 7.3 g/dL (ref 6.5–8.1)

## 2016-06-11 LAB — I-STAT CG4 LACTIC ACID, ED
Lactic Acid, Venous: 3.78 mmol/L (ref 0.5–1.9)
Lactic Acid, Venous: 6.09 mmol/L (ref 0.5–1.9)
Lactic Acid, Venous: 1.38 mmol/L (ref 0.5–1.9)

## 2016-06-11 LAB — URINALYSIS, ROUTINE W REFLEX MICROSCOPIC
Bilirubin Urine: NEGATIVE
Glucose, UA: NEGATIVE mg/dL
Hgb urine dipstick: NEGATIVE
Ketones, ur: 5 mg/dL — AB
LEUKOCYTES UA: NEGATIVE
NITRITE: NEGATIVE
PH: 9 — AB (ref 5.0–8.0)
Protein, ur: NEGATIVE mg/dL
SPECIFIC GRAVITY, URINE: 1.013 (ref 1.005–1.030)

## 2016-06-11 LAB — LIPASE, BLOOD: LIPASE: 20 U/L (ref 11–51)

## 2016-06-11 LAB — I-STAT BETA HCG BLOOD, ED (MC, WL, AP ONLY): I-stat hCG, quantitative: <5 m[IU]/mL (ref <5)

## 2016-06-11 MED ORDER — CIPROFLOXACIN HCL 500 MG PO TABS: 500.0000 mg | ORAL_TABLET | Freq: Two times a day (BID) | ORAL | 0 refills | Status: DC

## 2016-06-11 MED ORDER — PIPERACILLIN-TAZOBACTAM 3.375 G IVPB 30 MIN
3.3750 g | Freq: Once | INTRAVENOUS | Status: AC
  Administered 2016-06-11: 3.375 g via INTRAVENOUS
  Filled 2016-06-11: qty 50

## 2016-06-11 MED ORDER — ONDANSETRON HCL 4 MG/2ML IJ SOLN
INTRAMUSCULAR | Status: AC
  Filled 2016-06-11: qty 2

## 2016-06-11 MED ORDER — ONDANSETRON 4 MG PO TBDP: 4.0000 mg | ORAL_TABLET | Freq: Three times a day (TID) | ORAL | 0 refills | Status: DC | PRN

## 2016-06-11 MED ORDER — SODIUM CHLORIDE 0.9 % IV BOLUS (SEPSIS)
1000.0000 mL | Freq: Once | INTRAVENOUS | Status: AC
  Administered 2016-06-11: 1000 mL via INTRAVENOUS

## 2016-06-11 MED ORDER — ONDANSETRON HCL 4 MG/2ML IJ SOLN
4.0000 mg | Freq: Once | INTRAMUSCULAR | Status: AC
  Administered 2016-06-11: 4 mg via INTRAVENOUS

## 2016-06-11 MED ORDER — IOPAMIDOL (ISOVUE-300) INJECTION 61%
INTRAVENOUS | Status: AC
  Administered 2016-06-11: 100 mL
  Filled 2016-06-11: qty 100

## 2016-06-11 MED ORDER — KETOROLAC TROMETHAMINE 30 MG/ML IJ SOLN
30.0000 mg | Freq: Once | INTRAMUSCULAR | Status: AC
  Administered 2016-06-11: 30 mg via INTRAVENOUS
  Filled 2016-06-11: qty 1

## 2016-06-11 MED ORDER — DICYCLOMINE HCL 20 MG PO TABS: 20.0000 mg | ORAL_TABLET | Freq: Two times a day (BID) | ORAL | 0 refills | Status: DC

## 2016-06-11 MED ORDER — METRONIDAZOLE 500 MG PO TABS: 500.0000 mg | ORAL_TABLET | Freq: Two times a day (BID) | ORAL | 0 refills | Status: DC

## 2016-06-11 NOTE — ED Notes (Signed)
Pt given juice for PO challenge, tolerating w/o difficulty.

## 2016-06-11 NOTE — ED Notes (Signed)
Upon initial assessment at 0730, pt shaking/shivering and diaphoretic. At 0740, this RN was rounding on pt and noted pt asleep in stretcher, NAD, not shaking or shivering.

## 2016-06-11 NOTE — ED Provider Notes (Signed)
[facility]-EMERGENCY DEPT Provider Note   CSN: [id number] Arrival date & time: 06/11/16  [id number]     History   Chief Complaint Chief Complaint  Patient presents with  . Abdominal Pain    HPI Miranda Morrow is a 30 y.o. female.  The history is provided by the patient and medical records.  Abdominal Pain   Associated symptoms include diarrhea, nausea and vomiting.    30 year old female here with lower abdominal pain, onset 3 AM. She reports associated nausea, vomiting, diarrhea. She denies any sick contacts or abnormal food intake. She denies any fever but is chilled. No history of GI symptoms in the past. She has not tried any intervention prior to arrival. She denies any urinary symptoms or pelvic pain.  Last menstrual period unknown, patient is on Depo and usually does not have a period.  No prior abdominal surgeries.  VSS on arrival.  Past Medical History:  Diagnosis Date  . Abnormal Pap smear of cervix 2006   underwent colposcopy followed by   . Functional ovarian cysts     There are no active problems to display for this patient.   Past Surgical History:  Procedure Laterality Date  . COLPOSCOPY  2006  . CRYOTHERAPY  2006    OB History    Gravida Para Term Preterm AB Living   7 4 4  0 2 4   SAB TAB Ectopic Multiple Live Births   2 0 0 0         Home Medications    Prior to Admission medications   Not on File    Family History Family History  Problem Relation Age of Onset  . Hypertension Mother   . Heart disease Mother     also has pacemaker/AICD  . Congestive Heart Failure Mother     Social History Social History  Substance Use Topics  . Smoking status: Former Smoker    Years: 3.00    Types: Cigarettes  . Smokeless tobacco: Never Used  . Alcohol use 0.0 oz/week     Allergies   Patient has no known allergies.   Review of Systems Review of Systems  Gastrointestinal: Positive for abdominal pain, diarrhea, nausea and vomiting.  All other  systems reviewed and are negative.    Physical Exam Updated Vital Signs BP 110/66   Pulse 82   Temp 97.5 F (36.4 C) (Rectal)   Resp 25   SpO2 97%   Physical Exam  Constitutional: She is oriented to person, place, and time. She appears well-developed and well-nourished.  Chilled, sweaty, shaking  HENT:  Head: Normocephalic and atraumatic.  Mouth/Throat: Oropharynx is clear and moist.  Eyes: Conjunctivae and EOM are normal. Pupils are equal, round, and reactive to light.  Neck: Normal range of motion.  Cardiovascular: Normal rate, regular rhythm and normal heart sounds.   Pulmonary/Chest: Effort normal and breath sounds normal. No respiratory distress. She has no wheezes.  Abdominal: Soft. Bowel sounds are normal. There is no rebound.  Tenderness across lower abdomen, voluntary guarding  Musculoskeletal: Normal range of motion.  Neurological: She is alert and oriented to person, place, and time.  Skin: Skin is warm and dry.  Psychiatric: She has a normal mood and affect.  Nursing note and vitals reviewed.    ED Treatments / Results  Labs (all labs ordered are listed, but only abnormal results are displayed) Labs Reviewed  CBC WITH DIFFERENTIAL/PLATELET - Abnormal; Notable for the following:       Result Value  WBC 21.9 (*)    MCV 76.9 (*)    MCH 25.6 (*)    Neutro Abs 18.0 (*)    All other components within normal limits  COMPREHENSIVE METABOLIC PANEL - Abnormal; Notable for the following:    CO2 17 (*)    Glucose, Bld 115 (*)    All other components within normal limits  URINALYSIS, ROUTINE W REFLEX MICROSCOPIC - Abnormal; Notable for the following:    Color, Urine STRAW (*)    pH 9.0 (*)    Ketones, ur 5 (*)    All other components within normal limits  I-STAT CHEM 8, ED - Abnormal; Notable for the following:    Glucose, Bld 123 (*)    Calcium, Ion 0.98 (*)    All other components within normal limits  I-STAT CG4 LACTIC ACID, ED - Abnormal; Notable for the  following:    Lactic Acid, Venous 6.09 (*)    All other components within normal limits  I-STAT CG4 LACTIC ACID, ED - Abnormal; Notable for the following:    Lactic Acid, Venous 3.78 (*)    All other components within normal limits  LIPASE, BLOOD  I-STAT BETA HCG BLOOD, ED ([facility], [facility], [facility] ONLY)  I-STAT CG4 LACTIC ACID, ED    EKG  EKG Interpretation None       Radiology Ct Abdomen Pelvis W Contrast  Addendum Date: 06/11/2016   ADDENDUM REPORT: 06/11/2016 10:26 ADDENDUM: Omitted from IMPRESSION: Question fluid within endometrial canal versus prominent endometrium related to phase of menses. If patient is having abnormal vaginal bleeding, consider pelvic sonography. Electronically Signed   By: Ulyses Southward M.D.   On: 06/11/2016 10:26   Result Date: 06/11/2016 CLINICAL DATA:  Infraumbilical pain with vomiting and diarrhea beginning 2 hours ago, former smoker EXAM: CT ABDOMEN AND PELVIS WITH CONTRAST TECHNIQUE: Multidetector CT imaging of the abdomen and pelvis was performed using the standard protocol following bolus administration of intravenous contrast. Sagittal and coronal MPR images reconstructed from axial data set. CONTRAST:  1 ISOVUE-300 IOPAMIDOL (ISOVUE-300) INJECTION 61% IV. Oral contrast was not administered. COMPARISON:  11/20/2014 FINDINGS: Lower chest: Lung bases clear Hepatobiliary: 5 mm central hepatic cyst image 16. Gallbladder and liver otherwise normal appearance Pancreas: Normal appearance Spleen: Normal appearance Adrenals/Urinary Tract: Adrenal glands, kidneys, ureters, and bladder normal appearance Stomach/Bowel: Appendix not visualized but no pericecal inflammatory process is seen. Bowel loops are unopacified and under distended limiting assessment. No definite bowel wall thickening. No bowel dilatation or evidence of obstruction. Slightly increased opacity in the small mesentery, uncertain if artifact related to crowding of vessels or minimal edema. Vascular/Lymphatic:  Normal appearance Reproductive: RIGHT ovarian cyst 4.0 x 3.2 x 3.3 cm. Prominent endometrial cavity questionably containing fluid. Uterus and LEFT adnexa otherwise unremarkable. Other: Small amount of free pelvic fluid. No definite free air. No hernia. Musculoskeletal: Unremarkable IMPRESSION: RIGHT ovarian cyst 4.0 cm greatest size with small amount of free pelvic fluid. Questionable mild edema versus artifact within small bowel mesentery, could potentially be seen with a small bowel enteritis but no other definite bowel abnormalities are identified on exam lacking GI contrast. Electronically Signed: By: Ulyses Southward M.D. On: 06/11/2016 10:17    Procedures Procedures (including critical care time)  Medications Ordered in ED Medications  sodium chloride 0.9 % bolus 1,000 mL (0 mLs Intravenous Stopped 06/11/16 0830)  ondansetron (ZOFRAN) injection 4 mg (4 mg Intravenous Given 06/11/16 0729)  piperacillin-tazobactam (ZOSYN) IVPB 3.375 g (0 g Intravenous Stopped 06/11/16 0845)  sodium  chloride 0.9 % bolus 1,000 mL (0 mLs Intravenous Stopped 06/11/16 0930)  iopamidol (ISOVUE-300) 61 % injection (100 mLs  Contrast Given 06/11/16 0926)  ketorolac (TORADOL) 30 MG/ML injection 30 mg (30 mg Intravenous Given 06/11/16 1128)     Initial Impression / Assessment and Plan / ED Course  I have reviewed the triage vital signs and the nursing notes.  Pertinent labs & imaging results that were available during my care of the patient were reviewed by me and considered in my medical decision making (see chart for details).  Clinical Course    30 year old female here with abdominal pain, nausea, vomiting, and diarrhea, onset 3 AM. She is afebrile but is diaphoretic and shaking during exam. She denies any sick contacts. Rectal temp 99. She has tenderness across her lower abdomen without peritonitis. Workup pending. Patient given IV fluids, Zofran.  7:37 AM Went back to reassess patient after initial assessment at  0730, she is now lying still in the bed, no longer shaking, and is asleep.  Vitals are stable.  Labs pending.  IVF infusing.  Will monitor.  7:50 AM Patient's lactate is 6.09.   Patient is hemodynamically stable, continues sleeping in the room in NAD.  Question if this is elevated due to her shaking during initial exam.  Will re-check.  8:08 AM Repeat lactate now 3.78.  WBC is 21K.  Patient to undergo CT scan for further evaluation.  Will start on zosyn given lab findings.  Patient has remained stable here in the emergency department.  CT scan with findings of a right ovarian cyst as well as a small bowel enteritis. The enteritis is likely the source of patient's abdominal pain, nausea, vomiting, and diarrhea.  Patient was aggressively fluid resuscitated here, her lactic is now normal at 1.3. She has tolerated several cups of juice without any active emesis here in the ED. She is ambulatory. Her vitals are stable on room air. Question if her lactate was elevated due to her shaking on initial presentation. She does not clinically appear septic at this time. Feel she is stable for discharge.  Will start cipro/flagyl given CT findings and elevated WBC.  Recommended to follow-up with PCP.  Discussed plan with patient, she acknowledged understanding and agreed with plan of care.  Return precautions given for new or worsening symptoms.  Case discussed with attending physician, Dr Rush Landmarkegeler, who agrees with assessment and plan of care.  Final Clinical Impressions(s) / ED Diagnoses   Final diagnoses:  Enteritis  Nausea vomiting and diarrhea    New Prescriptions Discharge Medication List as of 06/11/2016 12:48 PM    START taking these medications   Details  ciprofloxacin (CIPRO) 500 MG tablet Take 1 tablet (500 mg total) by mouth every 12 (twelve) hours., Starting Sun 06/11/2016, Print    dicyclomine (BENTYL) 20 MG tablet Take 1 tablet (20 mg total) by mouth 2 (two) times daily., Starting Sun  06/11/2016, Print    metroNIDAZOLE (FLAGYL) 500 MG tablet Take 1 tablet (500 mg total) by mouth 2 (two) times daily., Starting Sun 06/11/2016, Print    ondansetron (ZOFRAN ODT) 4 MG disintegrating tablet Take 1 tablet (4 mg total) by mouth every 8 (eight) hours as needed for nausea., Starting Sun 06/11/2016, Print         Garlon HatchetLisa M Samirah Scarpati, PA-C 06/11/16 1506    Canary Brimhristopher J Tegeler, MD 06/12/16 1102

## 2016-06-11 NOTE — ED Notes (Signed)
Pt ambulatory w/ steady gait to restroom to obtain urine specimen. 

## 2016-06-11 NOTE — Discharge Instructions (Signed)
Take the prescribed medication as directed. °Follow-up with your primary care doctor. °Return to the ED for new or worsening symptoms. °

## 2016-06-11 NOTE — ED Notes (Signed)
Contacted CT about delay in scan. Pt is 2nd in line for scan at this time.

## 2016-06-11 NOTE — ED Notes (Signed)
Pt transported to CT ?

## 2016-06-11 NOTE — ED Triage Notes (Signed)
Pt reports abdominal pain started 2-3 hours ago with vomiting and diarrhea.

## 2016-06-22 ENCOUNTER — Emergency Department (HOSPITAL_COMMUNITY): Payer: No Typology Code available for payment source

## 2016-06-22 ENCOUNTER — Emergency Department (HOSPITAL_COMMUNITY)
Admission: EM | Admit: 2016-06-22 | Discharge: 2016-06-22 | Disposition: A | Discharge status: Home / Self Care | Payer: No Typology Code available for payment source | Attending: Emergency Medicine | Admitting: Emergency Medicine

## 2016-06-22 ENCOUNTER — Encounter (HOSPITAL_COMMUNITY): Payer: Self-pay | Admitting: Emergency Medicine

## 2016-06-22 DIAGNOSIS — Z87891 Personal history of nicotine dependence: Secondary | ICD-10-CM | POA: Diagnosis not present

## 2016-06-22 DIAGNOSIS — Y939 Activity, unspecified: Secondary | ICD-10-CM | POA: Diagnosis not present

## 2016-06-22 DIAGNOSIS — Z79899 Other long term (current) drug therapy: Secondary | ICD-10-CM | POA: Insufficient documentation

## 2016-06-22 DIAGNOSIS — S3992XA Unspecified injury of lower back, initial encounter: Secondary | ICD-10-CM | POA: Diagnosis present

## 2016-06-22 DIAGNOSIS — Y92410 Unspecified street and highway as the place of occurrence of the external cause: Secondary | ICD-10-CM | POA: Diagnosis not present

## 2016-06-22 DIAGNOSIS — Y999 Unspecified external cause status: Secondary | ICD-10-CM | POA: Diagnosis not present

## 2016-06-22 DIAGNOSIS — S39012A Strain of muscle, fascia and tendon of lower back, initial encounter: Secondary | ICD-10-CM

## 2016-06-22 LAB — POC URINE PREG, ED: PREG TEST UR: NEGATIVE

## 2016-06-22 MED ORDER — CYCLOBENZAPRINE HCL 10 MG PO TABS
10.0000 mg | ORAL_TABLET | Freq: Once | ORAL | Status: AC
  Administered 2016-06-22: 10 mg via ORAL
  Filled 2016-06-22: qty 1

## 2016-06-22 MED ORDER — NAPROXEN 250 MG PO TABS
500.0000 mg | ORAL_TABLET | Freq: Once | ORAL | Status: AC
  Administered 2016-06-22: 500 mg via ORAL
  Filled 2016-06-22: qty 2

## 2016-06-22 MED ORDER — CYCLOBENZAPRINE HCL 10 MG PO TABS: 10.0000 mg | ORAL_TABLET | Freq: Two times a day (BID) | ORAL | 0 refills | Status: DC | PRN

## 2016-06-22 MED ORDER — NAPROXEN 500 MG PO TABS: 500.0000 mg | ORAL_TABLET | Freq: Two times a day (BID) | ORAL | 0 refills | Status: DC
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
## 2016-06-22 NOTE — ED Notes (Signed)
Patient transported to X-ray 
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
## 2016-07-28 ENCOUNTER — Other Ambulatory Visit: Payer: Self-pay | Admitting: Primary Care

## 2016-07-28 DIAGNOSIS — R102 Pelvic and perineal pain: Secondary | ICD-10-CM

## 2016-08-03 ENCOUNTER — Ambulatory Visit (HOSPITAL_COMMUNITY): Admission: RE | Admit: 2016-08-03 | Payer: Medicaid Other | Source: Ambulatory Visit

## 2017-02-10 ENCOUNTER — Emergency Department (HOSPITAL_COMMUNITY)
Admission: EM | Admit: 2017-02-10 | Discharge: 2017-02-10 | Disposition: A | Discharge status: Home / Self Care | Payer: Medicaid Other | Attending: Emergency Medicine | Admitting: Emergency Medicine

## 2017-02-10 ENCOUNTER — Encounter (HOSPITAL_COMMUNITY): Payer: Self-pay | Admitting: Emergency Medicine

## 2017-02-10 DIAGNOSIS — Y92410 Unspecified street and highway as the place of occurrence of the external cause: Secondary | ICD-10-CM | POA: Insufficient documentation

## 2017-02-10 DIAGNOSIS — Z87891 Personal history of nicotine dependence: Secondary | ICD-10-CM | POA: Diagnosis not present

## 2017-02-10 DIAGNOSIS — M549 Dorsalgia, unspecified: Secondary | ICD-10-CM | POA: Insufficient documentation

## 2017-02-10 DIAGNOSIS — Y9389 Activity, other specified: Secondary | ICD-10-CM | POA: Insufficient documentation

## 2017-02-10 DIAGNOSIS — Z79899 Other long term (current) drug therapy: Secondary | ICD-10-CM | POA: Insufficient documentation

## 2017-02-10 DIAGNOSIS — Y999 Unspecified external cause status: Secondary | ICD-10-CM | POA: Insufficient documentation

## 2017-02-10 MED ORDER — NAPROXEN 500 MG PO TABS: 500.0000 mg | ORAL_TABLET | Freq: Two times a day (BID) | ORAL | 0 refills | Status: DC

## 2017-02-10 MED ORDER — METHOCARBAMOL 500 MG PO TABS: 500.0000 mg | ORAL_TABLET | Freq: Two times a day (BID) | ORAL | 0 refills | Status: DC | PRN
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
## 2017-02-10 NOTE — Discharge Instructions (Signed)
Take naproxen twice a day with meals. Do not take other anti-inflammatories at the same time (Advil, ibuprofen, Motrin, Aleve). You may take Tylenol as needed for further pain control. Take Robaxin as needed for muscle stiffness or soreness. You may use heat or ice as needed to help with pain control. You will likely have continued pain over the next several days. Follow-up with your primary care doctor in 1 week if pain persists. Return to the emergency department if you develop vision changes, confusion, vomiting, numbness, tingling, loss of bowel or bladder control, or any new or worsening symptoms.

## 2017-07-31 ENCOUNTER — Emergency Department (HOSPITAL_COMMUNITY)
Admission: EM | Admit: 2017-07-31 | Discharge: 2017-07-31 | Disposition: A | Discharge status: Home / Self Care | Payer: Medicaid Other | Attending: Emergency Medicine | Admitting: Emergency Medicine

## 2017-07-31 ENCOUNTER — Encounter (HOSPITAL_COMMUNITY): Payer: Self-pay | Admitting: Emergency Medicine

## 2017-07-31 DIAGNOSIS — K5900 Constipation, unspecified: Secondary | ICD-10-CM | POA: Diagnosis not present

## 2017-07-31 DIAGNOSIS — Z87891 Personal history of nicotine dependence: Secondary | ICD-10-CM | POA: Diagnosis not present

## 2017-07-31 DIAGNOSIS — R109 Unspecified abdominal pain: Secondary | ICD-10-CM | POA: Diagnosis present

## 2017-07-31 DIAGNOSIS — R1084 Generalized abdominal pain: Secondary | ICD-10-CM | POA: Insufficient documentation

## 2017-07-31 LAB — URINALYSIS, ROUTINE W REFLEX MICROSCOPIC
Bilirubin Urine: NEGATIVE
GLUCOSE, UA: NEGATIVE mg/dL
HGB URINE DIPSTICK: NEGATIVE
KETONES UR: 20 mg/dL — AB
NITRITE: NEGATIVE
PROTEIN: 30 mg/dL — AB
Specific Gravity, Urine: 1.032 — ABNORMAL HIGH (ref 1.005–1.030)
pH: 5 (ref 5.0–8.0)

## 2017-07-31 LAB — CBC
HEMATOCRIT: 39.8 % (ref 36.0–46.0)
Hemoglobin: 13 g/dL (ref 12.0–15.0)
MCH: 25.7 pg — AB (ref 26.0–34.0)
MCHC: 32.7 g/dL (ref 30.0–36.0)
MCV: 78.7 fL (ref 78.0–100.0)
Platelets: 217 10*3/uL (ref 150–400)
RBC: 5.06 MIL/uL (ref 3.87–5.11)
RDW: 14.3 % (ref 11.5–15.5)
WBC: 8 10*3/uL (ref 4.0–10.5)

## 2017-07-31 LAB — COMPREHENSIVE METABOLIC PANEL
ALT: 14 U/L (ref 14–54)
AST: 19 U/L (ref 15–41)
Albumin: 4.6 g/dL (ref 3.5–5.0)
Alkaline Phosphatase: 44 U/L (ref 38–126)
Anion gap: 9 (ref 5–15)
BILIRUBIN TOTAL: 0.7 mg/dL (ref 0.3–1.2)
BUN: 19 mg/dL (ref 6–20)
CHLORIDE: 106 mmol/L (ref 101–111)
CO2: 22 mmol/L (ref 22–32)
CREATININE: 0.66 mg/dL (ref 0.44–1.00)
Calcium: 9.4 mg/dL (ref 8.9–10.3)
GFR calc Af Amer: >60 mL/min (ref >60)
Glucose, Bld: 88 mg/dL (ref 65–99)
Potassium: 3.7 mmol/L (ref 3.5–5.1)
Sodium: 137 mmol/L (ref 135–145)
Total Protein: 8.2 g/dL — ABNORMAL HIGH (ref 6.5–8.1)

## 2017-07-31 LAB — I-STAT BETA HCG BLOOD, ED (MC, WL, AP ONLY)

## 2017-07-31 LAB — LIPASE, BLOOD: Lipase: 27 U/L (ref 11–51)

## 2017-07-31 MED ORDER — POLYETHYLENE GLYCOL 3350 17 GM/SCOOP PO POWD: 1.0000 | Freq: Once | ORAL | 0 refills | Status: AC

## 2017-07-31 MED ORDER — OMEPRAZOLE 20 MG PO CPDR: 20.0000 mg | DELAYED_RELEASE_CAPSULE | Freq: Every day | ORAL | 0 refills | Status: DC

## 2017-07-31 MED ORDER — ONDANSETRON 4 MG PO TBDP: 4.0000 mg | ORAL_TABLET | ORAL | 0 refills | Status: DC | PRN

## 2017-07-31 NOTE — ED Provider Notes (Signed)
[facility]-EMERGENCY DEPT Provider Note   CSN: [id number] Arrival date & time: 07/31/17  0730     History   Chief Complaint Chief Complaint  Patient presents with  . Abdominal Pain    HPI Miranda Morrow is a 32 y.o. female.  HPI Patient states she has had pain for the past 5 days.  It comes and intense peroxisomal of cramping aching pain that lasts anywhere from 1 minute to 10 minutes.  It resolves completely between episodes.  She reports this morning after work the episode was so intense she had to pull over and vomit several times.  Patient reports that she chronically has irregular bowel movements.  She reports typically she may have one bowel movement a week.  She reports she only has additional bowel movements if she eats certain fatty foods.  Her typical dietary pattern includes eating out frequently.  She works an evening shift and has irregular meal times.  Patient denies any pain or burning with urination.  She denies vaginal discharge or bleeding.  She reports she does not have suspicion for sexually transmitted disease.  She reports this type of thing has been a problem off and on and never has a specific diagnosis.  She is otherwise healthy. Past Medical History:  Diagnosis Date  . Abnormal Pap smear of cervix 2006   underwent colposcopy followed by   . Functional ovarian cysts     There are no active problems to display for this patient.   Past Surgical History:  Procedure Laterality Date  . COLPOSCOPY  2006  . CRYOTHERAPY  2006    OB History    Gravida Para Term Preterm AB Living   7 4 4  0 2 4   SAB TAB Ectopic Multiple Live Births   2 0 0 0         Home Medications    Prior to Admission medications   Medication Sig Start Date End Date Taking? Authorizing Provider  ciprofloxacin (CIPRO) 500 MG tablet Take 1 tablet (500 mg total) by mouth every 12 (twelve) hours. Patient not taking: Reported on 07/31/2017 06/11/16   Garlon Hatchet, PA-C  cyclobenzaprine (FLEXERIL) 10 MG tablet Take 1 tablet (10 mg total) by mouth 2 (two) times daily as needed for muscle spasms. Patient not taking: Reported on 07/31/2017 06/22/16   Cheri Fowler, PA-C  dicyclomine (BENTYL) 20 MG tablet Take 1 tablet (20 mg total) by mouth 2 (two) times daily. Patient not taking: Reported on 07/31/2017 06/11/16   Garlon Hatchet, PA-C  methocarbamol (ROBAXIN) 500 MG tablet Take 1 tablet (500 mg total) by mouth 2 (two) times daily as needed for muscle spasms. Patient not taking: Reported on 07/31/2017 02/10/17   Caccavale, Sophia, PA-C  metroNIDAZOLE (FLAGYL) 500 MG tablet Take 1 tablet (500 mg total) by mouth 2 (two) times daily. Patient not taking: Reported on 07/31/2017 06/11/16   Garlon Hatchet, PA-C  naproxen (NAPROSYN) 500 MG tablet Take 1 tablet (500 mg total) by mouth 2 (two) times daily with a meal. Patient not taking: Reported on 07/31/2017 02/10/17   Caccavale, Sophia, PA-C  omeprazole (PRILOSEC) 20 MG capsule Take 1 capsule (20 mg total) by mouth daily. 07/31/17   Arby Barrette, MD  ondansetron (ZOFRAN ODT) 4 MG disintegrating tablet Take 1 tablet (4 mg total) by mouth every 8 (eight) hours as needed for nausea. Patient not taking: Reported on 07/31/2017 06/11/16   Garlon Hatchet, PA-C  ondansetron [facility] ODT)  4 MG disintegrating tablet Take 1 tablet (4 mg total) by mouth every 4 (four) hours as needed for nausea or vomiting. 07/31/17   Arby BarrettePfeiffer, Khailee Mick, MD  polyethylene glycol powder (GLYCOLAX/MIRALAX) powder Take 255 g by mouth once for 1 dose. Take once daily until you have had normal bowel movement. 07/31/17 07/31/17  Arby BarrettePfeiffer, Lucylle Foulkes, MD    Family History Family History  Problem Relation Age of Onset  . Hypertension Mother   . Heart disease Mother        also has pacemaker/AICD  . Congestive Heart Failure Mother     Social History Social History   Tobacco Use  . Smoking status: Former Smoker    Years: 3.00    Types: Cigarettes  .  Smokeless tobacco: Never Used  Substance Use Topics  . Alcohol use: Yes    Alcohol/week: 0.0 oz    Comment: occ  . Drug use: No     Allergies   Patient has no known allergies.   Review of Systems Review of Systems 10 Systems reviewed and are negative for acute change except as noted in the HPI.   Physical Exam Updated Vital Signs BP (!) 125/99 (BP Location: Left Arm)   Pulse 62   Temp 98.9 F (37.2 C) (Oral)   Resp 16   Ht 4\' 11"  (1.499 m)   Wt 54.9 kg (121 lb)   SpO2 100%   BMI 24.44 kg/m   Physical Exam  Constitutional: She is oriented to person, place, and time. She appears well-developed and well-nourished. No distress.  Patient is clinically well in appearance.  No distress at this time.  HENT:  Head: Normocephalic and atraumatic.  Eyes: Conjunctivae are normal.  Neck: Neck supple.  Cardiovascular: Normal rate and regular rhythm.  No murmur heard. Pulmonary/Chest: Effort normal and breath sounds normal. No respiratory distress.  Abdominal: Soft. Bowel sounds are normal. She exhibits no distension. There is no tenderness. There is no guarding.  Genitourinary:  Genitourinary Comments: Normal perianal region.  No stool in the vault.  No mass or fullness.  Musculoskeletal: She exhibits no edema.  Neurological: She is alert and oriented to person, place, and time. No cranial nerve deficit. She exhibits normal muscle tone. Coordination normal.  Skin: Skin is warm and dry.  Psychiatric: She has a normal mood and affect.  Nursing note and vitals reviewed.    ED Treatments / Results  Labs (all labs ordered are listed, but only abnormal results are displayed) Labs Reviewed  COMPREHENSIVE METABOLIC PANEL - Abnormal; Notable for the following components:      Result Value   Total Protein 8.2 (*)    All other components within normal limits  CBC - Abnormal; Notable for the following components:   MCH 25.7 (*)    All other components within normal limits    URINALYSIS, ROUTINE W REFLEX MICROSCOPIC - Abnormal; Notable for the following components:   APPearance HAZY (*)    Specific Gravity, Urine 1.032 (*)    Ketones, ur 20 (*)    Protein, ur 30 (*)    Leukocytes, UA SMALL (*)    Bacteria, UA RARE (*)    Squamous Epithelial / LPF 6-30 (*)    All other components within normal limits  LIPASE, BLOOD  I-STAT BETA HCG BLOOD, ED ([facility], [facility], [facility] ONLY)    EKG  EKG Interpretation None       Radiology No results found.  Procedures Procedures (including critical care time)  Medications Ordered in  ED Medications - No data to display   Initial Impression / Assessment and Plan / ED Course  I have reviewed the triage vital signs and the nursing notes.  Pertinent labs & imaging results that were available during my care of the patient were reviewed by me and considered in my medical decision making (see chart for details).      Final Clinical Impressions(s) / ED Diagnoses   Final diagnoses:  Constipation, unspecified constipation type  Generalized abdominal pain  Patient is clinically well in appearance and pain-free at this time.  Diagnostic results are unremarkable.  Patient has had prior CT scans over the past several years for abdominal pain.  By patient's history of cramping paroxysmal runs of pain lasting 1-10 minutes that resolved completely between episodes and 1 or less bowel movements per week, I have high suspicion for constipation.  I have reviewed that the patient dietary suggestions.  We have reviewed using MiraLAX initially and then goal of normal bowel movements with healthy diet.  Patient is also advised, due to recurrence of symptoms and length of symptoms to schedule follow-up with gastroenterology.  Return precautions reviewed.  Patient does not feel she has risk for STD\pelvic infection.  Symptoms do not sound suggestive of this.  She deferred pelvic exam at this time.  ED Discharge Orders        Ordered    ondansetron  (ZOFRAN ODT) 4 MG disintegrating tablet  Every 4 hours PRN     07/31/17 1055    omeprazole (PRILOSEC) 20 MG capsule  Daily     07/31/17 1055    polyethylene glycol powder (GLYCOLAX/MIRALAX) powder   Once     07/31/17 1055       Arby Barrette, MD 07/31/17 1107

## 2017-07-31 NOTE — Discharge Instructions (Signed)
1.  Try to make dietary changes that include regular small meals.  Eat healthy foods as listed in your discharge instructions.  Try to avoid meals made in restaurants that are high in fat and salt. 2.  Try to set aside a regular time during the day to try to have a bowel movement.  Sit on the toilet and relax without straining. 3.  At this time, use MiraLAX every third day if you have not had a bowel movement.  Your goal is to start having regular bowel movements based on a healthy diet without the routine use of laxatives or stool softeners. 4.  Since you have had the symptoms for a prolonged period of time, make an appointment with the gastroenterologist group listed above for further evaluation and follow-up check.

## 2017-07-31 NOTE — ED Notes (Signed)
Bed: WA04 Expected date:  Expected time:  Means of arrival:  Comments: held
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
## 2017-08-23 ENCOUNTER — Emergency Department (HOSPITAL_COMMUNITY): Payer: Medicaid Other

## 2017-08-23 ENCOUNTER — Encounter (HOSPITAL_COMMUNITY): Payer: Self-pay | Admitting: *Deleted

## 2017-08-23 ENCOUNTER — Other Ambulatory Visit: Payer: Self-pay

## 2017-08-23 ENCOUNTER — Emergency Department (HOSPITAL_COMMUNITY)
Admission: EM | Admit: 2017-08-23 | Discharge: 2017-08-23 | Disposition: A | Discharge status: Home / Self Care | Payer: Medicaid Other | Attending: Emergency Medicine | Admitting: Emergency Medicine

## 2017-08-23 DIAGNOSIS — Z87891 Personal history of nicotine dependence: Secondary | ICD-10-CM | POA: Diagnosis not present

## 2017-08-23 DIAGNOSIS — R569 Unspecified convulsions: Secondary | ICD-10-CM | POA: Diagnosis present

## 2017-08-23 DIAGNOSIS — Z79899 Other long term (current) drug therapy: Secondary | ICD-10-CM | POA: Diagnosis not present

## 2017-08-23 DIAGNOSIS — R55 Syncope and collapse: Secondary | ICD-10-CM | POA: Diagnosis not present

## 2017-08-23 LAB — CBC WITH DIFFERENTIAL/PLATELET
BASOS PCT: 1 %
Basophils Absolute: 0.1 10*3/uL (ref 0.0–0.1)
Eosinophils Absolute: 0.1 10*3/uL (ref 0.0–0.7)
Eosinophils Relative: 1 %
HEMATOCRIT: 41.2 % (ref 36.0–46.0)
HEMOGLOBIN: 13.4 g/dL (ref 12.0–15.0)
Lymphocytes Relative: 32 %
Lymphs Abs: 3.7 10*3/uL (ref 0.7–4.0)
MCH: 25.4 pg — ABNORMAL LOW (ref 26.0–34.0)
MCHC: 32.5 g/dL (ref 30.0–36.0)
MCV: 78 fL (ref 78.0–100.0)
Monocytes Absolute: 0.6 10*3/uL (ref 0.1–1.0)
Monocytes Relative: 5 %
NEUTROS ABS: 7.2 10*3/uL (ref 1.7–7.7)
NEUTROS PCT: 61 %
Platelets: 218 10*3/uL (ref 150–400)
RBC: 5.28 MIL/uL — ABNORMAL HIGH (ref 3.87–5.11)
RDW: 13.3 % (ref 11.5–15.5)
WBC: 11.6 10*3/uL — AB (ref 4.0–10.5)

## 2017-08-23 LAB — BASIC METABOLIC PANEL
ANION GAP: 9 (ref 5–15)
BUN: 8 mg/dL (ref 6–20)
CALCIUM: 9.1 mg/dL (ref 8.9–10.3)
CHLORIDE: 108 mmol/L (ref 101–111)
CO2: 21 mmol/L — AB (ref 22–32)
Creatinine, Ser: 0.72 mg/dL (ref 0.44–1.00)
GFR calc non Af Amer: >60 mL/min (ref >60)
Glucose, Bld: 96 mg/dL (ref 65–99)
Potassium: 3.9 mmol/L (ref 3.5–5.1)
SODIUM: 138 mmol/L (ref 135–145)

## 2017-08-23 LAB — CBG MONITORING, ED: GLUCOSE-CAPILLARY: 89 mg/dL (ref 65–99)

## 2017-08-23 LAB — I-STAT BETA HCG BLOOD, ED (MC, WL, AP ONLY)

## 2017-08-23 NOTE — ED Notes (Signed)
Returned from  X-ray

## 2017-08-23 NOTE — Discharge Instructions (Signed)
Your symptoms are thought to be from overwork causing you to pass out.  It is unlikely that you had a seizure.  Please drink more fluid, get more rest, and follow-up with your doctor.  Return to the ER for worsening symptoms.

## 2017-08-23 NOTE — ED Notes (Signed)
Patient transported to x-ray. ?

## 2017-08-23 NOTE — ED Triage Notes (Signed)
Patient was at work and co-worker found her with seizure like activity. States her eyes had rolled back and her legs were moving lasted approx. 1 minute. No incont. Or oral trauma. Upon arrival patient is alert oriented c/o dizziness yest states she has been up for approx. 24 hours. C/o left anterior shoulder pain.

## 2017-08-23 NOTE — ED Notes (Signed)
Pt complain about a headache and dizzy while walking in the room ,assit pt.back in bed.covered her up.

## 2017-08-23 NOTE — ED Provider Notes (Signed)
[facility] EMERGENCY DEPARTMENT Provider Note   CSN: [id number] Arrival date & time: 08/23/17  0442     History   Chief Complaint Chief Complaint  Patient presents with  . Seizures    HPI Miranda Morrow is a 32 y.o. female.  Patient presents to the ED with a chief complaint of seizure like activity.  She states that she works 7 days a week and has been up for 24 hours.  She states that she was preparing a mop bucket at work and became lightheaded.  She states that the next thing she remembers in waking up in the ambulance.  She was reported to have some lower extremity seizure like activity after passing out and was confused upon arrival of EMS.  She is back to baseline now.  She complains of left shoulder pain. She denies headache, neck pain, chest pain, SOB. She denies any history of seizures.  She denies any other associated symptoms.   The history is provided by the patient. No language interpreter was used.    Past Medical History:  Diagnosis Date  . Abnormal Pap smear of cervix 2006   underwent colposcopy followed by   . Functional ovarian cysts     There are no active problems to display for this patient.   Past Surgical History:  Procedure Laterality Date  . COLPOSCOPY  2006  . CRYOTHERAPY  2006    OB History    Gravida Para Term Preterm AB Living   7 4 4  0 2 4   SAB TAB Ectopic Multiple Live Births   2 0 0 0         Home Medications    Prior to Admission medications   Medication Sig Start Date End Date Taking? Authorizing Provider  ciprofloxacin (CIPRO) 500 MG tablet Take 1 tablet (500 mg total) by mouth every 12 (twelve) hours. Patient not taking: Reported on 07/31/2017 06/11/16   Garlon Hatchet, PA-C  cyclobenzaprine (FLEXERIL) 10 MG tablet Take 1 tablet (10 mg total) by mouth 2 (two) times daily as needed for muscle spasms. Patient not taking: Reported on 07/31/2017 06/22/16   Cheri Fowler, PA-C  dicyclomine (BENTYL) 20 MG  tablet Take 1 tablet (20 mg total) by mouth 2 (two) times daily. Patient not taking: Reported on 07/31/2017 06/11/16   Garlon Hatchet, PA-C  methocarbamol (ROBAXIN) 500 MG tablet Take 1 tablet (500 mg total) by mouth 2 (two) times daily as needed for muscle spasms. Patient not taking: Reported on 07/31/2017 02/10/17   Caccavale, Sophia, PA-C  metroNIDAZOLE (FLAGYL) 500 MG tablet Take 1 tablet (500 mg total) by mouth 2 (two) times daily. Patient not taking: Reported on 07/31/2017 06/11/16   Garlon Hatchet, PA-C  naproxen (NAPROSYN) 500 MG tablet Take 1 tablet (500 mg total) by mouth 2 (two) times daily with a meal. Patient not taking: Reported on 07/31/2017 02/10/17   Caccavale, Sophia, PA-C  omeprazole (PRILOSEC) 20 MG capsule Take 1 capsule (20 mg total) by mouth daily. 07/31/17   Arby Barrette, MD  ondansetron (ZOFRAN ODT) 4 MG disintegrating tablet Take 1 tablet (4 mg total) by mouth every 8 (eight) hours as needed for nausea. Patient not taking: Reported on 07/31/2017 06/11/16   Garlon Hatchet, PA-C  ondansetron (ZOFRAN ODT) 4 MG disintegrating tablet Take 1 tablet (4 mg total) by mouth every 4 (four) hours as needed for nausea or vomiting. 07/31/17   Arby Barrette, MD    Family History Family  History  Problem Relation Age of Onset  . Hypertension Mother   . Heart disease Mother        also has pacemaker/AICD  . Congestive Heart Failure Mother     Social History Social History   Tobacco Use  . Smoking status: Former Smoker    Years: 3.00    Types: Cigarettes  . Smokeless tobacco: Never Used  Substance Use Topics  . Alcohol use: Yes    Alcohol/week: 0.0 oz    Comment: occ  . Drug use: No     Allergies   Patient has no known allergies.   Review of Systems Review of Systems  All other systems reviewed and are negative.    Physical Exam Updated Vital Signs BP 118/88 (BP Location: Right Arm)   Pulse 66   Temp (!) 97.1 F (36.2 C) (Temporal)   Resp 18   Ht 4\' 11"   (1.499 m)   Wt 54.9 kg (121 lb)   SpO2 99%   BMI 24.44 kg/m   Physical Exam  Constitutional: She is oriented to person, place, and time. She appears well-developed and well-nourished. No distress.  HENT:  Head: Normocephalic and atraumatic.  Right Ear: External ear normal.  Left Ear: External ear normal.  Eyes: Conjunctivae and EOM are normal. Pupils are equal, round, and reactive to light.  Neck: Normal range of motion. Neck supple.  No pain with neck flexion, no meningismus  Cardiovascular: Normal rate, regular rhythm and normal heart sounds. Exam reveals no gallop and no friction rub.  No murmur heard. Pulmonary/Chest: Effort normal and breath sounds normal. No respiratory distress. She has no wheezes. She has no rales. She exhibits no tenderness.  Abdominal: Soft. She exhibits no distension and no mass. There is no tenderness. There is no rebound and no guarding.  Musculoskeletal: Normal range of motion. She exhibits no edema or tenderness.  Mild tenderness to left anterior shoulder, no bony abnormality or deformity  Neurological: She is alert and oriented to person, place, and time. She has normal reflexes.  CN 3-12 intact, normal finger to nose, no pronator drift, sensation and strength intact bilaterally.  Skin: Skin is warm and dry.  Psychiatric: She has a normal mood and affect. Her behavior is normal. Judgment and thought content normal.  Nursing note and vitals reviewed.    ED Treatments / Results  Labs (all labs ordered are listed, but only abnormal results are displayed) Labs Reviewed  CBC WITH DIFFERENTIAL/PLATELET - Abnormal; Notable for the following components:      Result Value   WBC 11.6 (*)    RBC 5.28 (*)    MCH 25.4 (*)    All other components within normal limits  BASIC METABOLIC PANEL - Abnormal; Notable for the following components:   CO2 21 (*)    All other components within normal limits  CBG MONITORING, ED  I-STAT BETA HCG BLOOD, ED ([facility], [facility], [facility]  ONLY)    EKG  EKG Interpretation None     ED ECG REPORT  I personally interpreted this EKG   Date: 08/23/2017   Rate: 66  Rhythm: normal sinus rhythm and premature atrial contractions (PAC)  QRS Axis: normal  Intervals: normal  ST/T Wave abnormalities: normal  Conduction Disutrbances:none  Narrative Interpretation:   Old EKG Reviewed: none available    Radiology Ct Head Wo Contrast  Result Date: 08/23/2017 CLINICAL DATA:  Seizure like activity for 1 minute.  Dizziness. EXAM: CT HEAD WITHOUT CONTRAST TECHNIQUE: Contiguous axial images  were obtained from the base of the skull through the vertex without intravenous contrast. COMPARISON:  CT HEAD July 08, 2014 FINDINGS: BRAIN: No intraparenchymal hemorrhage, mass effect nor midline shift. The ventricles and sulci are normal. No acute large vascular territory infarcts. No abnormal extra-axial fluid collections. Basal cisterns are patent. VASCULAR: Unremarkable. SKULL/SOFT TISSUES: No skull fracture. No significant soft tissue swelling. ORBITS/SINUSES: The included ocular globes and orbital contents are normal.The mastoid aircells and included paranasal sinuses are well-aerated. OTHER: None. IMPRESSION: Normal noncontrast CT HEAD. Electronically Signed   By: Awilda Metroourtnay  Bloomer M.D.   On: 08/23/2017 06:02   Dg Shoulder Left  Result Date: 08/23/2017 CLINICAL DATA:  Fall with left shoulder pain. EXAM: LEFT SHOULDER - 2+ VIEW COMPARISON:  Radiograph 11/02/2015 FINDINGS: There is no evidence of fracture or dislocation. There is no evidence of arthropathy or other focal bone abnormality. Soft tissues are unremarkable. IMPRESSION: Negative radiographs of the left shoulder. Electronically Signed   By: Rubye OaksMelanie  Ehinger M.D.   On: 08/23/2017 05:35    Procedures Procedures (including critical care time)  Medications Ordered in ED Medications - No data to display   Initial Impression / Assessment and Plan / ED Course  I have reviewed the triage  vital signs and the nursing notes.  Pertinent labs & imaging results that were available during my care of the patient were reviewed by me and considered in my medical decision making (see chart for details).     Patient presented for syncope.  She was lightheaded prior to the syncopal event.  She reports waking up in the ambulance.  She had some apparent myoclonic jerking in her lower extremities.  She has no history of seizures.  CT head is negative, plain films of the left shoulder are negative.  Nonspecific mild leukocytosis, electrolytes are normal.  Patient is not pregnant.  She is not orthostatic.  EKG is unremarkable for concerning arrhythmias.  We will discharged home with primary care follow-up.  Final Clinical Impressions(s) / ED Diagnoses   Final diagnoses:  Syncope and collapse    ED Discharge Orders    None       Roxy HorsemanBrowning, Devyn Griffing, PA-C 08/23/17 [id number]    Azalia Bilisampos, Kevin, MD 08/24/17 [phone]

## 2017-12-10 ENCOUNTER — Emergency Department (HOSPITAL_COMMUNITY)
Admission: EM | Admit: 2017-12-10 | Discharge: 2017-12-10 | Disposition: A | Discharge status: Home / Self Care | Payer: Medicaid Other | Attending: Emergency Medicine | Admitting: Emergency Medicine

## 2017-12-10 ENCOUNTER — Other Ambulatory Visit: Payer: Self-pay

## 2017-12-10 ENCOUNTER — Encounter (HOSPITAL_COMMUNITY): Payer: Self-pay | Admitting: Emergency Medicine

## 2017-12-10 DIAGNOSIS — N643 Galactorrhea not associated with childbirth: Secondary | ICD-10-CM

## 2017-12-10 DIAGNOSIS — R112 Nausea with vomiting, unspecified: Secondary | ICD-10-CM | POA: Insufficient documentation

## 2017-12-10 DIAGNOSIS — Z87891 Personal history of nicotine dependence: Secondary | ICD-10-CM | POA: Insufficient documentation

## 2017-12-10 LAB — COMPREHENSIVE METABOLIC PANEL
ALT: 14 U/L (ref 14–54)
ANION GAP: 7 (ref 5–15)
AST: 18 U/L (ref 15–41)
Albumin: 3.8 g/dL (ref 3.5–5.0)
Alkaline Phosphatase: 39 U/L (ref 38–126)
BILIRUBIN TOTAL: 0.7 mg/dL (ref 0.3–1.2)
BUN: 10 mg/dL (ref 6–20)
CO2: 27 mmol/L (ref 22–32)
Calcium: 9.2 mg/dL (ref 8.9–10.3)
Chloride: 107 mmol/L (ref 101–111)
Creatinine, Ser: 0.8 mg/dL (ref 0.44–1.00)
GFR calc Af Amer: >60 mL/min (ref >60)
Glucose, Bld: 67 mg/dL (ref 65–99)
POTASSIUM: 3.6 mmol/L (ref 3.5–5.1)
Sodium: 141 mmol/L (ref 135–145)
Total Protein: 7.4 g/dL (ref 6.5–8.1)

## 2017-12-10 LAB — URINALYSIS, ROUTINE W REFLEX MICROSCOPIC
Bilirubin Urine: NEGATIVE
GLUCOSE, UA: NEGATIVE mg/dL
Ketones, ur: NEGATIVE mg/dL
Leukocytes, UA: NEGATIVE
NITRITE: NEGATIVE
PH: 5 (ref 5.0–8.0)
Protein, ur: NEGATIVE mg/dL
SPECIFIC GRAVITY, URINE: 1.024 (ref 1.005–1.030)

## 2017-12-10 LAB — CBC WITH DIFFERENTIAL/PLATELET
Abs Immature Granulocytes: 0 10*3/uL (ref 0.0–0.1)
BASOS PCT: 1 %
Basophils Absolute: 0.1 10*3/uL (ref 0.0–0.1)
EOS ABS: 0.1 10*3/uL (ref 0.0–0.7)
EOS PCT: 2 %
HEMATOCRIT: 42.6 % (ref 36.0–46.0)
Hemoglobin: 13.5 g/dL (ref 12.0–15.0)
Immature Granulocytes: 0 %
LYMPHS ABS: 2.8 10*3/uL (ref 0.7–4.0)
Lymphocytes Relative: 35 %
MCH: 25 pg — ABNORMAL LOW (ref 26.0–34.0)
MCHC: 31.7 g/dL (ref 30.0–36.0)
MCV: 79 fL (ref 78.0–100.0)
Monocytes Absolute: 0.7 10*3/uL (ref 0.1–1.0)
Monocytes Relative: 8 %
Neutro Abs: 4.4 10*3/uL (ref 1.7–7.7)
Neutrophils Relative %: 54 %
PLATELETS: 241 10*3/uL (ref 150–400)
RBC: 5.39 MIL/uL — AB (ref 3.87–5.11)
RDW: 13.2 % (ref 11.5–15.5)
WBC: 8.1 10*3/uL (ref 4.0–10.5)

## 2017-12-10 LAB — I-STAT BETA HCG BLOOD, ED (MC, WL, AP ONLY)

## 2017-12-10 LAB — LIPASE, BLOOD: Lipase: 37 U/L (ref 11–51)

## 2017-12-10 MED ORDER — ONDANSETRON HCL 4 MG PO TABS: 4.0000 mg | ORAL_TABLET | Freq: Four times a day (QID) | ORAL | 0 refills | Status: DC

## 2017-12-10 NOTE — ED Provider Notes (Signed)
[facility] EMERGENCY DEPARTMENT Provider Note   CSN: [id number] Arrival date & time: 12/10/17  1659     History   Chief Complaint Chief Complaint  Patient presents with  . Emesis    HPI Miranda Morrow is a 32 y.o. female.  HPI   32 year old female presents today with nausea vomiting.  Patient notes 2 days ago she developed nausea vomiting.  Patient notes she has had some intermittent abdominal pain, none presently.  Patient denies any urinary symptoms, diarrhea, vaginal discharge or bleeding.  Patient also notes she has had bilateral breast discharge for the last week, described as milky, nonbloody nonpurulent.      Past Medical History:  Diagnosis Date  . Abnormal Pap smear of cervix 2006   underwent colposcopy followed by   . Functional ovarian cysts     There are no active problems to display for this patient.   Past Surgical History:  Procedure Laterality Date  . COLPOSCOPY  2006  . CRYOTHERAPY  2006     OB History    Gravida  7   Para  4   Term  4   Preterm  0   AB  2   Living  4     SAB  2   TAB  0   Ectopic  0   Multiple  0   Live Births               Home Medications    Prior to Admission medications   Medication Sig Start Date End Date Taking? Authorizing Provider  ciprofloxacin (CIPRO) 500 MG tablet Take 1 tablet (500 mg total) by mouth every 12 (twelve) hours. Patient not taking: Reported on 07/31/2017 06/11/16   Garlon Hatchet, PA-C  cyclobenzaprine (FLEXERIL) 10 MG tablet Take 1 tablet (10 mg total) by mouth 2 (two) times daily as needed for muscle spasms. Patient not taking: Reported on 07/31/2017 06/22/16   Cheri Fowler, PA-C  dicyclomine (BENTYL) 20 MG tablet Take 1 tablet (20 mg total) by mouth 2 (two) times daily. Patient not taking: Reported on 07/31/2017 06/11/16   Garlon Hatchet, PA-C  methocarbamol (ROBAXIN) 500 MG tablet Take 1 tablet (500 mg total) by mouth 2 (two) times daily as needed for  muscle spasms. Patient not taking: Reported on 07/31/2017 02/10/17   Caccavale, Sophia, PA-C  metroNIDAZOLE (FLAGYL) 500 MG tablet Take 1 tablet (500 mg total) by mouth 2 (two) times daily. Patient not taking: Reported on 07/31/2017 06/11/16   Garlon Hatchet, PA-C  naproxen (NAPROSYN) 500 MG tablet Take 1 tablet (500 mg total) by mouth 2 (two) times daily with a meal. Patient not taking: Reported on 07/31/2017 02/10/17   Caccavale, Sophia, PA-C  omeprazole (PRILOSEC) 20 MG capsule Take 1 capsule (20 mg total) by mouth daily. Patient not taking: Reported on 08/23/2017 07/31/17   Arby Barrette, MD  ondansetron (ZOFRAN ODT) 4 MG disintegrating tablet Take 1 tablet (4 mg total) by mouth every 8 (eight) hours as needed for nausea. Patient not taking: Reported on 07/31/2017 06/11/16   Garlon Hatchet, PA-C  ondansetron (ZOFRAN ODT) 4 MG disintegrating tablet Take 1 tablet (4 mg total) by mouth every 4 (four) hours as needed for nausea or vomiting. Patient not taking: Reported on 08/23/2017 07/31/17   Arby Barrette, MD  ondansetron (ZOFRAN) 4 MG tablet Take 1 tablet (4 mg total) by mouth every 6 (six) hours. 12/10/17   Eyvonne Mechanic, PA-C    Family  History Family History  Problem Relation Age of Onset  . Hypertension Mother   . Heart disease Mother        also has pacemaker/AICD  . Congestive Heart Failure Mother     Social History Social History   Tobacco Use  . Smoking status: Former Smoker    Years: 3.00    Types: Cigarettes  . Smokeless tobacco: Never Used  Substance Use Topics  . Alcohol use: Yes    Alcohol/week: 0.0 oz    Comment: occ  . Drug use: No     Allergies   Patient has no known allergies.   Review of Systems Review of Systems  All other systems reviewed and are negative.    Physical Exam Updated Vital Signs BP 113/77   Pulse 61   Temp 98.4 F (36.9 C) (Oral)   Resp 16   Ht 4\' 11"  (1.499 m)   Wt 58.1 kg (128 lb)   LMP  (LMP Unknown)   SpO2 100%   BMI  25.85 kg/m   Physical Exam  Constitutional: She is oriented to person, place, and time. She appears well-developed and well-nourished.  HENT:  Head: Normocephalic and atraumatic.  Eyes: Pupils are equal, round, and reactive to light. Conjunctivae are normal. Right eye exhibits no discharge. Left eye exhibits no discharge. No scleral icterus.  Neck: Normal range of motion. No JVD present. No tracheal deviation present.  Pulmonary/Chest: Effort normal. No stridor.  Bilateral breasts without swelling or edema, no discharge noted no masses  Abdominal: Soft. She exhibits no distension and no mass. There is no tenderness. There is no rebound and no guarding. No hernia.  Neurological: She is alert and oriented to person, place, and time. Coordination normal.  Psychiatric: She has a normal mood and affect. Her behavior is normal. Judgment and thought content normal.  Nursing note and vitals reviewed.    ED Treatments / Results  Labs (all labs ordered are listed, but only abnormal results are displayed) Labs Reviewed  CBC WITH DIFFERENTIAL/PLATELET - Abnormal; Notable for the following components:      Result Value   RBC 5.39 (*)    MCH 25.0 (*)    All other components within normal limits  URINALYSIS, ROUTINE W REFLEX MICROSCOPIC - Abnormal; Notable for the following components:   APPearance HAZY (*)    Hgb urine dipstick SMALL (*)    Bacteria, UA RARE (*)    All other components within normal limits  COMPREHENSIVE METABOLIC PANEL  LIPASE, BLOOD  I-STAT BETA HCG BLOOD, ED ([facility], [facility], [facility] ONLY)    EKG None  Radiology No results found.  Procedures Procedures (including critical care time)  Medications Ordered in ED Medications - No data to display   Initial Impression / Assessment and Plan / ED Course  I have reviewed the triage vital signs and the nursing notes.  Pertinent labs & imaging results that were available during my care of the patient were reviewed by me and  considered in my medical decision making (see chart for details).     32 year old female presents today with nausea and vomiting.  None presently.  Patient reports she does not need antinausea medication.  She has a soft nontender abdomen with no elevation of white count or fever.  Low suspicion for acute intra-abdominal infection.  Patient also here with breast discharge, none noted on exam, she will be encouraged to follow-up with her primary care for reevaluation and subsequent testing.  Patient is given strict  return precautions, she verbalized understanding and agreement to today's plan had no further questions or concerns at time discharge.  Final Clinical Impressions(s) / ED Diagnoses   Final diagnoses:  Non-intractable vomiting with nausea, unspecified vomiting type  Galactorrhea    ED Discharge Orders        Ordered    ondansetron (ZOFRAN) 4 MG tablet  Every 6 hours     12/10/17 2106       Eyvonne MechanicHedges, Tamitha Norell, PA-C 12/13/17 1211    Mancel BaleWentz, Elliott, MD 12/13/17 1324

## 2017-12-10 NOTE — ED Provider Notes (Signed)
Patient placed in Quick Look pathway, seen and evaluated   Chief Complaint: nausea, vomiting, abdominal pain  HPI:   Miranda Morrow is a 32 y.o. female who presents to the ED with n/v that started 2 days ago. Patient reports pain in her abdomen off and on for about a week and that she noted a knot in the RLQ that comes and goes. Patient denies UTI symptoms diarrhea or vaginal d/c.   ROS: GI: n/v, abdominal pain  Physical Exam:  BP 99/63 (BP Location: Right Arm)   Pulse 68   Temp 98.4 F (36.9 C) (Oral)   Resp 16   SpO2 100%    Gen: No distress  Neuro: Awake and Alert  Skin: Warm and dry  Abdomen: soft, mildly tender with deep palpatin RLQ  No guarding or rebound.      Initiation of care has begun. The patient has been counseled on the process, plan, and necessity for staying for the completion/evaluation, and the remainder of the medical screening examination    Janne Napoleoneese, Amanuel Sinkfield M, NP 12/10/17 1826    Derwood KaplanNanavati, Ankit, MD 12/12/17 [phone]

## 2017-12-10 NOTE — Discharge Instructions (Addendum)
Please read attached information. If you experience any new or worsening signs or symptoms please return to the emergency room for evaluation. Please follow-up with your primary care provider or specialist as discussed. Please use medication prescribed only as directed and discontinue taking if you have any concerning signs or symptoms.   °

## 2017-12-10 NOTE — ED Triage Notes (Signed)
Pt to ED with c/o nausea and vomiting x's 2 days, also c/o breast tenderness with drainage. Pt c/o a knot on her RLQ off and on.  Pt denies any diarrhea, urinary symptoms or vaginal discharge

## 2018-03-09 ENCOUNTER — Other Ambulatory Visit: Payer: Self-pay

## 2018-03-09 ENCOUNTER — Emergency Department (HOSPITAL_COMMUNITY)
Admission: EM | Admit: 2018-03-09 | Discharge: 2018-03-09 | Disposition: A | Discharge status: Home / Self Care | Payer: Medicaid Other | Attending: Emergency Medicine | Admitting: Emergency Medicine

## 2018-03-09 ENCOUNTER — Encounter (HOSPITAL_COMMUNITY): Payer: Self-pay | Admitting: *Deleted

## 2018-03-09 DIAGNOSIS — Z87891 Personal history of nicotine dependence: Secondary | ICD-10-CM | POA: Diagnosis not present

## 2018-03-09 DIAGNOSIS — N760 Acute vaginitis: Secondary | ICD-10-CM | POA: Diagnosis not present

## 2018-03-09 DIAGNOSIS — B9689 Other specified bacterial agents as the cause of diseases classified elsewhere: Secondary | ICD-10-CM | POA: Insufficient documentation

## 2018-03-09 DIAGNOSIS — N898 Other specified noninflammatory disorders of vagina: Secondary | ICD-10-CM | POA: Diagnosis present

## 2018-03-09 DIAGNOSIS — J069 Acute upper respiratory infection, unspecified: Secondary | ICD-10-CM

## 2018-03-09 LAB — WET PREP, GENITAL
SPERM: NONE SEEN
TRICH WET PREP: NONE SEEN
YEAST WET PREP: NONE SEEN

## 2018-03-09 MED ORDER — NAPROXEN 500 MG PO TABS
500.0000 mg | ORAL_TABLET | Freq: Once | ORAL | Status: AC
  Administered 2018-03-09: 500 mg via ORAL
  Filled 2018-03-09: qty 1

## 2018-03-09 MED ORDER — LORATADINE 10 MG PO TABS
10.0000 mg | ORAL_TABLET | Freq: Once | ORAL | Status: AC
  Administered 2018-03-09: 10 mg via ORAL
  Filled 2018-03-09: qty 1

## 2018-03-09 MED ORDER — METRONIDAZOLE 0.75 % VA GEL: 1.0000 | Freq: Two times a day (BID) | VAGINAL | 0 refills | Status: DC

## 2018-03-09 MED ORDER — LORATADINE 10 MG PO TABS: 10.0000 mg | ORAL_TABLET | Freq: Every day | ORAL | 0 refills | Status: DC

## 2018-03-09 MED ORDER — IBUPROFEN 600 MG PO TABS: 600.0000 mg | ORAL_TABLET | Freq: Four times a day (QID) | ORAL | 0 refills | Status: DC | PRN

## 2018-03-09 NOTE — Discharge Instructions (Signed)
We recommend the use of ibuprofen and Claritin for management of your upper respiratory symptoms.  The symptoms are likely due to a viral illness and will resolve in the next few days.  Your wet prep today was consistent with bacterial vaginosis.  This is an overgrowth of the normal bacteria in your vaginal canal.  Use MetroGel as prescribed for management of this.  Follow-up with the health department in 48 hours for the results of your STD tests. If you test positive for STDs, notify all sexual partners of their need to be tested and treated as well. Use a condom when sexually active.

## 2018-03-09 NOTE — ED Triage Notes (Signed)
Pt reports she has been sick with cold symptoms (congestion, headache, cough) for several days. Her child is also sick at home. In addition, she has been having a white vaginal discharge for 3 days.

## 2018-03-11 LAB — GC/CHLAMYDIA PROBE AMP (~~LOC~~) NOT AT ARMC
CHLAMYDIA, DNA PROBE: NEGATIVE
NEISSERIA GONORRHEA: NEGATIVE

## 2018-03-11 NOTE — ED Provider Notes (Signed)
[facility]-EMERGENCY DEPT Provider Note   CSN: [id number] Arrival date & time: 03/09/18  0035     History   Chief Complaint Chief Complaint  Patient presents with  . Vaginal Discharge    HPI Miranda Morrow is a 32 y.o. female.   32 year old female presented to the emergency department for evaluation of upper respiratory symptoms including congestion and rhinorrhea.  Symptoms have been associated with a headache as well as a sporadic, dry cough.  She denies taking any medications for her symptoms, stating that she does not like taking medicine.  She has been around her child at home who has been sick with similar symptoms.  No reported fever, shortness of breath, vomiting, diarrhea.  No inability to swallow or drooling.  She also expresses concern about a white vaginal discharge which has been present for the past 3 days.  Onset was after intercourse with 2 female partners 4 days ago.  She reports use of condoms during sexual activity.     Past Medical History:  Diagnosis Date  . Abnormal Pap smear of cervix 2006   underwent colposcopy followed by   . Functional ovarian cysts     There are no active problems to display for this patient.   Past Surgical History:  Procedure Laterality Date  . COLPOSCOPY  2006  . CRYOTHERAPY  2006     OB History    Gravida  7   Para  4   Term  4   Preterm  0   AB  2   Living  4     SAB  2   TAB  0   Ectopic  0   Multiple  0   Live Births               Home Medications    Prior to Admission medications   Medication Sig Start Date End Date Taking? Authorizing Provider  ibuprofen (ADVIL,MOTRIN) 600 MG tablet Take 1 tablet (600 mg total) by mouth every 6 (six) hours as needed for headache, mild pain or moderate pain. 03/09/18   Antony Madura, PA-C  loratadine (CLARITIN) 10 MG tablet Take 1 tablet (10 mg total) by mouth daily. For congestion, runny nose, and sinus pressure 03/09/18   Antony Madura,  PA-C  medroxyPROGESTERone (DEPO-PROVERA) 150 MG/ML injection Inject 150 mg into the muscle every 3 (three) months.     [provider]  metroNIDAZOLE (METROGEL VAGINAL) 0.75 % vaginal gel Place 1 Applicatorful vaginally 2 (two) times daily. 03/09/18   Antony Madura, PA-C    Family History Family History  Problem Relation Age of Onset  . Hypertension Mother   . Heart disease Mother        also has pacemaker/AICD  . Congestive Heart Failure Mother     Social History Social History   Tobacco Use  . Smoking status: Former Smoker    Years: 3.00    Types: Cigarettes  . Smokeless tobacco: Never Used  Substance Use Topics  . Alcohol use: Yes    Alcohol/week: 0.0 standard drinks    Comment: occ  . Drug use: No     Allergies   Patient has no known allergies.   Review of Systems Review of Systems Ten systems reviewed and are negative for acute change, except as noted in the HPI.    Physical Exam Updated Vital Signs BP 122/72   Pulse 69   Temp 98.9 F (37.2 C) (Oral)   Resp 16  SpO2 100%   Physical Exam  Constitutional: She is oriented to person, place, and time. She appears well-developed and well-nourished. No distress.  Nontoxic appearing and in no acute distress  HENT:  Head: Normocephalic and atraumatic.  Right Ear: Tympanic membrane, external ear and ear canal normal.  Left Ear: Tympanic membrane, external ear and ear canal normal.  Mouth/Throat: Oropharynx is clear and moist.  Audible nasal congestion.  Uvula midline and oropharynx clear.  Tolerating secretions.  Eyes: Conjunctivae and EOM are normal. No scleral icterus.  Neck: Normal range of motion.  Cardiovascular: Normal rate, regular rhythm and intact distal pulses.  Pulmonary/Chest: Effort normal. No stridor. No respiratory distress. She has no wheezes. She has no rales.  Lungs clear to auscultation bilaterally  Genitourinary:  Genitourinary Comments: Scant clear vaginal discharge. No cervical  friability. No CMT.  Musculoskeletal: Normal range of motion.  Neurological: She is alert and oriented to person, place, and time. She exhibits normal muscle tone. Coordination normal.  Skin: Skin is warm and dry. No rash noted. She is not diaphoretic. No erythema. No pallor.  Psychiatric: She has a normal mood and affect. Her behavior is normal.  Nursing note and vitals reviewed.    ED Treatments / Results  Labs (all labs ordered are listed, but only abnormal results are displayed) Labs Reviewed  WET PREP, GENITAL - Abnormal; Notable for the following components:      Result Value   Clue Cells Wet Prep HPF POC PRESENT (*)    WBC, Wet Prep HPF POC FEW (*)    All other components within normal limits  GC/CHLAMYDIA PROBE AMP ([place]) NOT AT [facility]    EKG None  Radiology No results found.  Procedures Procedures (including critical care time)  Medications Ordered in ED Medications  naproxen (NAPROSYN) tablet 500 mg (500 mg Oral Given 03/09/18 0339)  loratadine (CLARITIN) tablet 10 mg (10 mg Oral Given 03/09/18 0339)     Initial Impression / Assessment and Plan / ED Course  I have reviewed the triage vital signs and the nursing notes.  Pertinent labs & imaging results that were available during my care of the patient were reviewed by me and considered in my medical decision making (see chart for details).     32 year old female presents for symptoms consistent with upper respiratory infection, likely viral etiology.  Her vital signs are reassuring.  The patient is afebrile, satting well.  She has clear lung sounds and audible nasal congestion.  We will continue with outpatient supportive care including ibuprofen and Claritin.  Patient also concerned about vaginal discharge x3 days.  Onset was after sexual intercourse with 2 female partners.  She does endorse the use of condoms.  Has no concerning findings on GU exam.  Her wet prep is consistent with likely bacterial vaginosis.   Will discharge with MetroGel.  Return precautions discussed and provided. Patient discharged in stable condition with no unaddressed concerns.   Final Clinical Impressions(s) / ED Diagnoses   Final diagnoses:  BV (bacterial vaginosis)  Upper respiratory tract infection, unspecified type    ED Discharge Orders         Ordered    metroNIDAZOLE (METROGEL VAGINAL) 0.75 % vaginal gel  2 times daily     03/09/18 0427    ibuprofen (ADVIL,MOTRIN) 600 MG tablet  Every 6 hours PRN     03/09/18 0427    loratadine (CLARITIN) 10 MG tablet  Daily     03/09/18 0427  Antony Madura, PA-C 03/11/18 1103    Nira Conn, MD 03/11/18 [phone]

## 2018-05-04 ENCOUNTER — Emergency Department (HOSPITAL_COMMUNITY)
Admission: EM | Admit: 2018-05-04 | Discharge: 2018-05-04 | Disposition: A | Discharge status: Home / Self Care | Payer: Medicaid Other | Attending: Emergency Medicine | Admitting: Emergency Medicine

## 2018-05-04 ENCOUNTER — Encounter (HOSPITAL_COMMUNITY): Payer: Self-pay | Admitting: Emergency Medicine

## 2018-05-04 ENCOUNTER — Other Ambulatory Visit: Payer: Self-pay

## 2018-05-04 DIAGNOSIS — R519 Headache, unspecified: Secondary | ICD-10-CM

## 2018-05-04 DIAGNOSIS — Z79899 Other long term (current) drug therapy: Secondary | ICD-10-CM | POA: Diagnosis not present

## 2018-05-04 DIAGNOSIS — R51 Headache: Secondary | ICD-10-CM | POA: Insufficient documentation

## 2018-05-04 DIAGNOSIS — Z87891 Personal history of nicotine dependence: Secondary | ICD-10-CM | POA: Diagnosis not present

## 2018-05-04 DIAGNOSIS — R1033 Periumbilical pain: Secondary | ICD-10-CM | POA: Insufficient documentation

## 2018-05-04 DIAGNOSIS — R109 Unspecified abdominal pain: Secondary | ICD-10-CM

## 2018-05-04 LAB — COMPREHENSIVE METABOLIC PANEL
ALT: 13 U/L (ref 0–44)
ANION GAP: 4 — AB (ref 5–15)
AST: 17 U/L (ref 15–41)
Albumin: 3.8 g/dL (ref 3.5–5.0)
Alkaline Phosphatase: 37 U/L — ABNORMAL LOW (ref 38–126)
BUN: 17 mg/dL (ref 6–20)
CO2: 23 mmol/L (ref 22–32)
CREATININE: 0.75 mg/dL (ref 0.44–1.00)
Calcium: 9.2 mg/dL (ref 8.9–10.3)
Chloride: 111 mmol/L (ref 98–111)
GFR calc non Af Amer: >60 mL/min (ref >60)
Glucose, Bld: 104 mg/dL — ABNORMAL HIGH (ref 70–99)
POTASSIUM: 4.1 mmol/L (ref 3.5–5.1)
SODIUM: 138 mmol/L (ref 135–145)
Total Bilirubin: 0.3 mg/dL (ref 0.3–1.2)
Total Protein: 7 g/dL (ref 6.5–8.1)

## 2018-05-04 LAB — CBC
HEMATOCRIT: 42.2 % (ref 36.0–46.0)
HEMOGLOBIN: 12.3 g/dL (ref 12.0–15.0)
MCH: 24.1 pg — ABNORMAL LOW (ref 26.0–34.0)
MCHC: 29.1 g/dL — ABNORMAL LOW (ref 30.0–36.0)
MCV: 82.7 fL (ref 80.0–100.0)
NRBC: 0 % (ref 0.0–0.2)
Platelets: 252 10*3/uL (ref 150–400)
RBC: 5.1 MIL/uL (ref 3.87–5.11)
RDW: 14 % (ref 11.5–15.5)
WBC: 8.3 10*3/uL (ref 4.0–10.5)

## 2018-05-04 LAB — URINALYSIS, ROUTINE W REFLEX MICROSCOPIC
Bilirubin Urine: NEGATIVE
Glucose, UA: NEGATIVE mg/dL
HGB URINE DIPSTICK: NEGATIVE
Ketones, ur: NEGATIVE mg/dL
Nitrite: NEGATIVE
PROTEIN: NEGATIVE mg/dL
SPECIFIC GRAVITY, URINE: 1.014 (ref 1.005–1.030)
pH: 6 (ref 5.0–8.0)

## 2018-05-04 LAB — I-STAT BETA HCG BLOOD, ED (MC, WL, AP ONLY)

## 2018-05-04 LAB — LIPASE, BLOOD: LIPASE: 33 U/L (ref 11–51)

## 2018-05-04 MED ORDER — METHOCARBAMOL 500 MG PO TABS: 500.0000 mg | ORAL_TABLET | Freq: Two times a day (BID) | ORAL | 0 refills | Status: AC

## 2018-05-04 NOTE — Discharge Instructions (Addendum)
You may alternate taking Tylenol and Ibuprofen as needed for pain control. You may take 400-600 mg of ibuprofen every 6 hours and [phone] mg of Tylenol every 6 hours. Do not exceed 4000 mg of Tylenol daily as this can lead to liver damage. Also, make sure to take Ibuprofen with meals as it can cause an upset stomach. Do not take other NSAIDs while taking Ibuprofen such as (Aleve, Naprosyn, Aspirin, Celebrex, etc) and do not take more than the prescribed dose as this can lead to ulcers and bleeding in your GI tract.   You were given a prescription for Robaxin which is a muscle relaxer.  You should not drive, work, or operate machinery while taking this medication as it can make you very drowsy.    A culture was sent of your urine today to determine if there is any bacterial growth. If the results of the culture are positive and you require an antibiotic you will be contacted by the hospital. If the results are negative you will not be contacted.  Please follow up with your primary doctor within the next 7-10 days for re-evaluation and further treatment of your symptoms. You were give an referral to the neurology to follow up with if you continue to have persistent or worsening symptoms. They will contact you for an appointment.   Please return to the ER sooner if you have any new or worsening symptoms.

## 2018-05-04 NOTE — ED Triage Notes (Addendum)
Pt states she has had a headache and mid abdominal pain with some nausea for 1 week. LMP unknown. Pt states headache is 10.10. No diarrhea or vomiting. Last BM was yesterday. She also states she hit her head at work over a week ago on the paper towel holder. Pt denies any urinary symptoms.

## 2018-05-04 NOTE — ED Provider Notes (Signed)
[facility] EMERGENCY DEPARTMENT Provider Note   CSN: [id number] Arrival date & time: 05/04/18  1134     History   Chief Complaint Chief Complaint  Patient presents with  . Abdominal Pain  . Headache    HPI Miranda Morrow is a 32 y.o. female.  HPI   Pt is a 32 y/o female who presents to the ED today with multiple complaints.  Reports she has had periumbilical cramping that began 2 weeks ago. Pain has been intermittent. Episodes last 15 minutes and resolve on their own. Pain rated at 7/10 during episodes, pain currently 0/10. Reports nausea every other day for the last week. Has vomited x2 over the last week. Denies fevers, constipation, diarrhea, blood in stool, urinary or vaginal sxs.   Pt states she has had intermittent headaches over the last 2-3 weeks. States she has  About 2-3 headaches per day. Headaches last about 15 minutes. Headaches resolved with advil and tylenol. Headache located to the forehead.  With right-sided neck pain and right trapezius pain that feels like muscle spasm.  States she is right-handed and she started over to do a lot of cleaning at work.  Denies dizziness, lightheadedness, numbness, weakness, vision changes.  States she hit her head on a paper towel holder at work about 2 weeks ago. no LOC. Sxs began before this trauma. No trauma after this. Currently pain is not severe. Headache was gradual in onset. Has had headaches in the past but have not lasted this long.  Past Medical History:  Diagnosis Date  . Abnormal Pap smear of cervix 2006   underwent colposcopy followed by   . Functional ovarian cysts     There are no active problems to display for this patient.   Past Surgical History:  Procedure Laterality Date  . COLPOSCOPY  2006  . CRYOTHERAPY  2006     OB History    Gravida  7   Para  4   Term  4   Preterm  0   AB  2   Living  4     SAB  2   TAB  0   Ectopic  0   Multiple  0   Live Births              Home Medications    Prior to Admission medications   Medication Sig Start Date End Date Taking? Authorizing Provider  ibuprofen (ADVIL,MOTRIN) 600 MG tablet Take 1 tablet (600 mg total) by mouth every 6 (six) hours as needed for headache, mild pain or moderate pain. 03/09/18   Antony Madura, PA-C  loratadine (CLARITIN) 10 MG tablet Take 1 tablet (10 mg total) by mouth daily. For congestion, runny nose, and sinus pressure 03/09/18   Antony Madura, PA-C  medroxyPROGESTERone (DEPO-PROVERA) 150 MG/ML injection Inject 150 mg into the muscle every 3 (three) months.     [provider]  methocarbamol (ROBAXIN) 500 MG tablet Take 1 tablet (500 mg total) by mouth 2 (two) times daily for 5 days. 05/04/18 05/09/18  Mckena Chern S, PA-C  metroNIDAZOLE (METROGEL VAGINAL) 0.75 % vaginal gel Place 1 Applicatorful vaginally 2 (two) times daily. 03/09/18   Antony Madura, PA-C    Family History Family History  Problem Relation Age of Onset  . Hypertension Mother   . Heart disease Mother        also has pacemaker/AICD  . Congestive Heart Failure Mother     Social History Social History  Tobacco Use  . Smoking status: Former Smoker    Years: 3.00    Types: Cigarettes  . Smokeless tobacco: Never Used  Substance Use Topics  . Alcohol use: Yes    Alcohol/week: 0.0 standard drinks    Comment: occ  . Drug use: No     Allergies   Patient has no known allergies.   Review of Systems Review of Systems  Constitutional: Negative for chills and fever.  HENT: Negative for congestion, rhinorrhea and sore throat.   Eyes: Negative for photophobia and visual disturbance.  Respiratory: Negative for shortness of breath.   Cardiovascular: Negative for chest pain.  Gastrointestinal: Positive for abdominal pain, nausea and vomiting. Negative for constipation and diarrhea.  Genitourinary: Negative for dysuria, frequency, hematuria and urgency.  Musculoskeletal: Negative for back pain and neck  pain.  Skin: Negative for rash.  Neurological: Positive for headaches. Negative for dizziness, weakness, light-headedness and numbness.   Physical Exam Updated Vital Signs BP 124/68 (BP Location: Right Arm)   Pulse 86   Temp 98.3 F (36.8 C) (Oral)   Resp 18   SpO2 100%   Physical Exam  Constitutional: She appears well-developed and well-nourished. No distress.  HENT:  Head: Normocephalic and atraumatic.  Mouth/Throat: Oropharynx is clear and moist.  Bilateral TMs normal.  Eyes: Pupils are equal, round, and reactive to light. Conjunctivae and EOM are normal.  No nystagmus.  Neck: Neck supple.  Cardiovascular: Normal rate, regular rhythm and normal heart sounds.  No murmur heard. Pulmonary/Chest: Effort normal and breath sounds normal. No stridor. No respiratory distress. She has no wheezes.  Abdominal: Soft. Bowel sounds are normal. She exhibits no distension. There is no tenderness. There is no guarding.  Musculoskeletal: She exhibits no edema.  Tenderness to palpation of the right cervical paraspinous muscles in the right trapezius muscles.  Neurological: She is alert.  Mental Status:  Alert, thought content appropriate, able to give a coherent history. Speech fluent without evidence of aphasia. Able to follow 2 step commands without difficulty.  Cranial Nerves:  II:  pupils equal, round, reactive to light III,IV, VI: ptosis not present, extra-ocular motions intact bilaterally  V,VII: smile symmetric, facial light touch sensation equal VIII: hearing grossly normal to voice  X: uvula elevates symmetrically  XI: bilateral shoulder shrug symmetric and strong XII: midline tongue extension without fassiculations Motor:  Normal tone. 5/5 strength of BUE and BLE major muscle groups including strong and equal grip strength and dorsiflexion/plantar flexion Sensory: light touch normal in all extremities. Cerebellar: normal finger-to-nose with bilateral upper extremities, normal heel  to shin Gait: normal gait and balance. Able to walk on toes and heels with ease.  Negative romberg, negative pronator drift  Skin: Skin is warm and dry.  Psychiatric: She has a normal mood and affect.  Nursing note and vitals reviewed.    ED Treatments / Results  Labs (all labs ordered are listed, but only abnormal results are displayed) Labs Reviewed  COMPREHENSIVE METABOLIC PANEL - Abnormal; Notable for the following components:      Result Value   Glucose, Bld 104 (*)    Alkaline Phosphatase 37 (*)    Anion gap 4 (*)    All other components within normal limits  CBC - Abnormal; Notable for the following components:   MCH 24.1 (*)    MCHC 29.1 (*)    All other components within normal limits  URINALYSIS, ROUTINE W REFLEX MICROSCOPIC - Abnormal; Notable for the following components:   APPearance  HAZY (*)    Leukocytes, UA SMALL (*)    Bacteria, UA RARE (*)    All other components within normal limits  URINE CULTURE  LIPASE, BLOOD  I-STAT BETA HCG BLOOD, ED ([facility], [facility], [facility] ONLY)    EKG None  Radiology No results found.  Procedures Procedures (including critical care time)  Medications Ordered in ED Medications - No data to display   Initial Impression / Assessment and Plan / ED Course  I have reviewed the triage vital signs and the nursing notes.  Pertinent labs & imaging results that were available during my care of the patient were reviewed by me and considered in my medical decision making (see chart for details).   Final Clinical Impressions(s) / ED Diagnoses   Final diagnoses:  Nonintractable headache, unspecified chronicity pattern, unspecified headache type  Abdominal cramping   Patient in the ED with multiple complaints including headache and abdominal pain.  She currently has no headache or abdominal pain.  With regard to headache, symptoms have been intermittent over the last several weeks.  Headache is frontal in nature.  Associated with right  cervical paraspinous and right trapezius TTP. No red flag sxs. Neuro exam is completely normal and pt is currently asx. Her sxs seem most consistent with tension headache.  Doubt acute intracranial pathology that would require further work-up or admission to the hospital at this time.  Advised to continue Tylenol and Motrin.  We will also give Rx for Robaxin given her muscle tenderness on exam.  Have advised her to follow-up with her PCP.  She is also requesting an ambulatory referral to neurology for follow-up.  Have given this her information in case she has continued symptoms.  With regard to abdominal pain.  Pain is cramping, intermittent, and to the periumbilical area.  She has no pain currently and her abdominal exam is completely benign.  Labs are very reassuring.  CBC without leukocytosis or anemia.  CMP without any electrolyte abnormalities.  Kidney function and liver function are normal.  Lipase is normal.  UA is not consistent with urinary tract infection.  She has small amount of leukocytes, no RBCs or white blood cells.  Rare bacteria and 6-30 squamous epithelial cells.  Seems more consistent with contaminated specimen.  Attempted to send urine culture.  Patient states that she is unable to urinate and does not want to wait for urine culture to be sent.  She has no genitourinary complaints.  Doubt acute intra-abdominal or pelvic pathology that would require further work-up or imaging.  Have also advised her to follow-up with her PCP in regards to her symptoms and to return for any new or worsening symptoms in the meantime.  She voiced understanding of plan reasons to return to ED peer all questions answered.  ED Discharge Orders         Ordered    Ambulatory referral to Neurology    Comments:  An appointment is requested in approximately: 2 weeks   05/04/18 1501    methocarbamol (ROBAXIN) 500 MG tablet  2 times daily     05/04/18 [address], Dimitrius Steedman S, PA-C 05/04/18 1549      Pricilla Loveless, MD 05/04/18 2151

## 2018-05-04 NOTE — ED Notes (Signed)
Unable to add urine culture on urine in main lab. Asked patient to give new sample, she states that she can not give sample at this time.

## 2018-05-04 NOTE — ED Notes (Signed)
Urine and urine culture sent to lab in save tubes

## 2018-05-06 LAB — URINE CULTURE: Culture: >=100000 — AB

## 2018-07-16 ENCOUNTER — Ambulatory Visit: Payer: Medicaid Other | Admitting: Neurology

## 2018-07-17 ENCOUNTER — Encounter: Payer: Self-pay | Admitting: Neurology

## 2019-07-25 ENCOUNTER — Emergency Department (HOSPITAL_COMMUNITY)
Admission: EM | Admit: 2019-07-25 | Discharge: 2019-07-25 | Disposition: A | Discharge status: Home / Self Care | Payer: Medicaid Other | Attending: Emergency Medicine | Admitting: Emergency Medicine

## 2019-07-25 ENCOUNTER — Other Ambulatory Visit: Payer: Self-pay

## 2019-07-25 DIAGNOSIS — R519 Headache, unspecified: Secondary | ICD-10-CM | POA: Insufficient documentation

## 2019-07-25 DIAGNOSIS — M7918 Myalgia, other site: Secondary | ICD-10-CM | POA: Diagnosis not present

## 2019-07-25 DIAGNOSIS — Z20822 Contact with and (suspected) exposure to covid-19: Secondary | ICD-10-CM | POA: Diagnosis not present

## 2019-07-25 DIAGNOSIS — R1110 Vomiting, unspecified: Secondary | ICD-10-CM | POA: Insufficient documentation

## 2019-07-25 DIAGNOSIS — R509 Fever, unspecified: Secondary | ICD-10-CM | POA: Insufficient documentation

## 2019-07-25 DIAGNOSIS — J111 Influenza due to unidentified influenza virus with other respiratory manifestations: Secondary | ICD-10-CM

## 2019-07-25 MED ORDER — ONDANSETRON 4 MG PO TBDP: ORAL_TABLET | ORAL | 0 refills | Status: DC

## 2019-07-25 MED ORDER — BENZONATATE 100 MG PO CAPS: 100.0000 mg | ORAL_CAPSULE | Freq: Three times a day (TID) | ORAL | 0 refills | Status: DC

## 2019-07-25 MED ORDER — KETOROLAC TROMETHAMINE 15 MG/ML IJ SOLN
15.0000 mg | Freq: Once | INTRAMUSCULAR | Status: AC
  Administered 2019-07-25: 15 mg via INTRAMUSCULAR
  Filled 2019-07-25: qty 1

## 2019-07-25 NOTE — ED Triage Notes (Signed)
Pt brought in from home by EMS. Pt reports having a headache last night that has since resolved. Per EMS, pt currently complains of generalized body aches. Per EMS pt does not show signs of respiratory distress. Pt denies any known exposures to Covid.

## 2019-07-25 NOTE — Discharge Instructions (Signed)
Take tylenol 2 pills 4 times a day and motrin 4 pills 3 times a day.  Drink plenty of fluids.  Return for worsening shortness of breath, headache, confusion. Follow up with your family doctor.   

## 2019-07-25 NOTE — ED Provider Notes (Signed)
[facility] DEPT Provider Note   CSN: [id number] Arrival date & time: 07/25/19  5885     History Chief Complaint  Patient presents with  . Generalized Body Aches    Miranda Morrow is a 34 y.o. female.  34 yo F with a chief complaints of fevers chills myalgias vomiting and headache.  Started yesterday.  Has taken Tylenol and ibuprofen with some improvement.  Complaining mostly of muscle aches.  Someone at her work was sick with a "virus "thought to not be the coronavirus.  This was last week.  She has been able to drink without issue.  Denies abdominal pain.  The history is provided by the patient.  Illness Severity:  Moderate Onset quality:  Gradual Duration:  1 day Timing:  Constant Progression:  Unchanged Chronicity:  New Associated symptoms: fatigue, fever, nausea and vomiting   Associated symptoms: no abdominal pain, no chest pain, no congestion, no headaches, no myalgias, no rhinorrhea, no shortness of breath and no wheezing        Past Medical History:  Diagnosis Date  . Abnormal Pap smear of cervix 2006   underwent colposcopy followed by   . Functional ovarian cysts     There are no problems to display for this patient.   Past Surgical History:  Procedure Laterality Date  . COLPOSCOPY  2006  . CRYOTHERAPY  2006     OB History    Gravida  7   Para  4   Term  4   Preterm  0   AB  2   Living  4     SAB  2   TAB  0   Ectopic  0   Multiple  0   Live Births              Family History  Problem Relation Age of Onset  . Hypertension Mother   . Heart disease Mother        also has pacemaker/AICD  . Congestive Heart Failure Mother     Social History   Tobacco Use  . Smoking status: Former Smoker    Years: 3.00    Types: Cigarettes  . Smokeless tobacco: Never Used  Substance Use Topics  . Alcohol use: Yes    Alcohol/week: 0.0 standard drinks    Comment: occ  . Drug use: No    Home  Medications Prior to Admission medications   Medication Sig Start Date End Date Taking? Authorizing Provider  benzonatate (TESSALON) 100 MG capsule Take 1 capsule (100 mg total) by mouth every 8 (eight) hours. 07/25/19   Deno Etienne, DO  ibuprofen (ADVIL,MOTRIN) 600 MG tablet Take 1 tablet (600 mg total) by mouth every 6 (six) hours as needed for headache, mild pain or moderate pain. 03/09/18   Antonietta Breach, PA-C  loratadine (CLARITIN) 10 MG tablet Take 1 tablet (10 mg total) by mouth daily. For congestion, runny nose, and sinus pressure 03/09/18   Antonietta Breach, PA-C  medroxyPROGESTERone (DEPO-PROVERA) 150 MG/ML injection Inject 150 mg into the muscle every 3 (three) months.     [provider]  metroNIDAZOLE (METROGEL VAGINAL) 0.75 % vaginal gel Place 1 Applicatorful vaginally 2 (two) times daily. 03/09/18   Antonietta Breach, PA-C  ondansetron (ZOFRAN ODT) 4 MG disintegrating tablet 4mg  ODT q4 hours prn nausea/vomit 07/25/19   Deno Etienne, DO    Allergies    Patient has no known allergies.  Review of Systems   Review of Systems  Constitutional: Positive for fatigue and fever. Negative for chills.  HENT: Negative for congestion and rhinorrhea.   Eyes: Negative for redness and visual disturbance.  Respiratory: Negative for shortness of breath and wheezing.   Cardiovascular: Negative for chest pain and palpitations.  Gastrointestinal: Positive for nausea and vomiting. Negative for abdominal pain.  Genitourinary: Negative for dysuria and urgency.  Musculoskeletal: Negative for arthralgias and myalgias.  Skin: Negative for pallor and wound.  Neurological: Negative for dizziness and headaches.    Physical Exam Updated Vital Signs BP 128/74 (BP Location: Left Arm)   Pulse 84   Temp (!) 100.7 F (38.2 C) (Oral)   Resp 14   SpO2 97%   Physical Exam Vitals and nursing note reviewed.  Constitutional:      General: She is not in acute distress.    Appearance: She is well-developed. She is  not diaphoretic.  HENT:     Head: Normocephalic and atraumatic.  Eyes:     Pupils: Pupils are equal, round, and reactive to light.  Cardiovascular:     Rate and Rhythm: Normal rate and regular rhythm.     Heart sounds: No murmur. No friction rub. No gallop.   Pulmonary:     Effort: Pulmonary effort is normal.     Breath sounds: No wheezing or rales.  Abdominal:     General: There is no distension.     Palpations: Abdomen is soft.     Tenderness: There is no abdominal tenderness.  Musculoskeletal:        General: No tenderness.     Cervical back: Normal range of motion and neck supple.  Skin:    General: Skin is warm and dry.  Neurological:     Mental Status: She is alert and oriented to person, place, and time.  Psychiatric:        Behavior: Behavior normal.     ED Results / Procedures / Treatments   Labs (all labs ordered are listed, but only abnormal results are displayed) Labs Reviewed  NOVEL CORONAVIRUS, NAA (HOSP ORDER, SEND-OUT TO REF LAB; TAT 18-24 HRS)    EKG None  Radiology No results found.  Procedures Procedures (including critical care time)  Medications Ordered in ED Medications  ketorolac (TORADOL) 15 MG/ML injection 15 mg (has no administration in time range)    ED Course  I have reviewed the triage vital signs and the nursing notes.  Pertinent labs & imaging results that were available during my care of the patient were reviewed by me and considered in my medical decision making (see chart for details).    MDM Rules/Calculators/A&P                      34 yo F with a chief complaint of fever myalgias and headache.  Going on for 24 hours.  She is well-appearing nontoxic has moist mucous membranes.  Benign abdominal exam.  Not tachycardic.  Not requiring oxygen 99% room air.  Her history is somewhat concerning for the novel coronavirus.  Will obtain an outpatient test.  Symptomatic therapy.  Return precautions given.  Miranda Morrow was  evaluated in Emergency Department on 07/25/2019 for the symptoms described in the history of present illness. He/she was evaluated in the context of the global COVID-19 pandemic, which necessitated consideration that the patient might be at risk for infection with the SARS-CoV-2 virus that causes COVID-19. Institutional protocols and algorithms that pertain to the evaluation of patients at risk for  COVID-19 are in a state of rapid change based on information released by regulatory bodies including the CDC and federal and state organizations. These policies and algorithms were followed during the patient's care in the ED.  9:04 AM:  I have discussed the diagnosis/risks/treatment options with the patient and believe the pt to be eligible for discharge home to follow-up with PCP. We also discussed returning to the ED immediately if new or worsening sx occur. We discussed the sx which are most concerning (e.g., sudden worsening pain, fever, inability to tolerate by mouth) that necessitate immediate return. Medications administered to the patient during their visit and any new prescriptions provided to the patient are listed below.  Medications given during this visit Medications  ketorolac (TORADOL) 15 MG/ML injection 15 mg (has no administration in time range)     The patient appears reasonably screen and/or stabilized for discharge and I doubt any other medical condition or other [facility] requiring further screening, evaluation, or treatment in the ED at this time prior to discharge.   Final Clinical Impression(s) / ED Diagnoses Final diagnoses:  Influenza-like illness    Rx / DC Orders ED Discharge Orders         Ordered    ondansetron (ZOFRAN ODT) 4 MG disintegrating tablet     07/25/19 0900    benzonatate (TESSALON) 100 MG capsule  Every 8 hours     07/25/19 0900           Melene Plan, DO 07/25/19 [phone]

## 2019-07-26 LAB — NOVEL CORONAVIRUS, NAA (HOSP ORDER, SEND-OUT TO REF LAB; TAT 18-24 HRS): SARS-CoV-2, NAA: NOT DETECTED

## 2019-07-29 ENCOUNTER — Telehealth: Payer: Self-pay

## 2019-07-29 NOTE — Telephone Encounter (Signed)
Negative COVID results given. Patient results "NOT Detected." Caller expressed understanding. ° °

## 2019-09-24 ENCOUNTER — Other Ambulatory Visit: Payer: Self-pay

## 2019-09-24 ENCOUNTER — Emergency Department (HOSPITAL_COMMUNITY)
Admission: EM | Admit: 2019-09-24 | Discharge: 2019-09-24 | Disposition: A | Discharge status: Home / Self Care | Payer: Medicaid Other | Attending: Emergency Medicine | Admitting: Emergency Medicine

## 2019-09-24 DIAGNOSIS — Z87891 Personal history of nicotine dependence: Secondary | ICD-10-CM | POA: Diagnosis not present

## 2019-09-24 DIAGNOSIS — L300 Nummular dermatitis: Secondary | ICD-10-CM

## 2019-09-24 DIAGNOSIS — R21 Rash and other nonspecific skin eruption: Secondary | ICD-10-CM | POA: Diagnosis present

## 2019-09-24 MED ORDER — TRIAMCINOLONE ACETONIDE 0.5 % EX OINT: 1.0000 "application " | TOPICAL_OINTMENT | Freq: Two times a day (BID) | CUTANEOUS | 1 refills | Status: DC

## 2019-09-24 NOTE — ED Provider Notes (Signed)
[facility]-EMERGENCY DEPT [facility] Emergency Department Provider Note MRN:  [id number]  Arrival date & time: 09/24/19     Chief Complaint   Rash   History of Present Illness   Miranda Morrow is a 34 y.o. year-old female with no pertinent past medical history presenting to the ED with chief complaint of rash.  3 weeks of rash, itchy circular patches.  Noticed the first 1 on the right forearm, then 1 on the left thigh, spreading to the abdomen, chest, back, neck.  Sparing the hands and feet and face.  Has tried chamomile lotion without relief.  Denies fever, no chest pain or shortness of breath, no abdominal pain, no vomiting or diarrhea, no new exposures or detergents or soaps, no recent travel, no new medications.  No known allergies.  Review of Systems  A complete 10 system review of systems was obtained and all systems are negative except as noted in the HPI and PMH.   Patient's Health History    Past Medical History:  Diagnosis Date  . Abnormal Pap smear of cervix 2006   underwent colposcopy followed by   . Functional ovarian cysts     Past Surgical History:  Procedure Laterality Date  . COLPOSCOPY  2006  . CRYOTHERAPY  2006    Family History  Problem Relation Age of Onset  . Hypertension Mother   . Heart disease Mother        also has pacemaker/AICD  . Congestive Heart Failure Mother     Social History   Socioeconomic History  . Marital status: Single    Spouse name: Not on file  . Number of children: 4  . Years of education: 9  . Highest education level: Not on file  Occupational History  . Occupation: Conservation officer, nature at [employer]  . Smoking status: Former Smoker    Years: 3.00    Types: Cigarettes  . Smokeless tobacco: Never Used  Substance and Sexual Activity  . Alcohol use: Yes    Alcohol/week: 0.0 standard drinks    Comment: occ  . Drug use: No  . Sexual activity: Yes    Birth control/protection: Injection  Other Topics Concern  . Not on  file  Social History Narrative   Lives at home with her children except for daughter, [name] with her dad's family   Originally from [place]--came down to [place] when in [employer].   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   Social Determinants of Health   Financial Resource Strain:   . Difficulty of Paying Living Expenses:   Food Insecurity:   . Worried About Programme researcher, broadcasting/film/video in the Last Year:   . Barista in the Last Year:   Transportation Needs:   . Freight forwarder (Medical):   Marland Kitchen Lack of Transportation (Non-Medical):   Physical Activity:   . Days of Exercise per Week:   . Minutes of Exercise per Session:   Stress:   . Feeling of Stress :   Social Connections:   . Frequency of Communication with Friends and Family:   . Frequency of Social Gatherings with Friends and Family:   . Attends Religious Services:   . Active Member of Clubs or Organizations:   . Attends Banker Meetings:   Marland Kitchen Marital Status:   Intimate Partner Violence:   . Fear of Current or Ex-Partner:   . Emotionally Abused:   Marland Kitchen Physically Abused:   . Sexually Abused:  Physical Exam   Vitals:   09/24/19 1130 09/24/19 1132  BP: 115/78   Pulse: (!) 57   Resp: 18   Temp:    SpO2: 100% 98%    CONSTITUTIONAL: Well-appearing, NAD NEURO:  Alert and oriented x 3, no focal deficits EYES:  eyes equal and reactive ENT/NECK:  no LAD, no JVD CARDIO: Regular rate, well-perfused, normal S1 and S2 PULM:  CTAB no wheezing or rhonchi GI/GU:  normal bowel sounds, non-distended, non-tender MSK/SPINE:  No gross deformities, no edema SKIN: Scattered circular patches to the arms, torso, back, mildly erythematous with cracked whitish center PSYCH:  Appropriate speech and behavior  *Additional and/or pertinent findings included in MDM below  Diagnostic and Interventional Summary    EKG Interpretation  Date/Time:    Ventricular Rate:    PR Interval:    QRS Duration:     QT Interval:    QTC Calculation:   R Axis:     Text Interpretation:        Labs Reviewed - No data to display  No orders to display    Medications - No data to display   Procedures  /  Critical Care Procedures  ED Course and Medical Decision Making  I have reviewed the triage vital signs, the nursing notes, and pertinent available records from the EMR.  Pertinent labs & imaging results that were available during my care of the patient were reviewed by me and considered in my medical decision making (see below for details).     No signs or symptoms of emergent rash.  Rash seems most consistent with nummular eczema.  Will provide topical steroid cream and refer to dermatology.    Barth Kirks. 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  Your exam/testing today is overall reassuring.  Your symptoms seem consistent with a particular form of eczema.  Please take the steroid cream as directed and follow-up with a dermatologist.  Please return to the Emergency Department if you experience any worsening of your condition.  We encourage you to follow up with a primary care provider.  Thank you for allowing [place] to be a part of your care.       Maudie Flakes, MD 09/24/19 1218

## 2019-09-24 NOTE — ED Triage Notes (Signed)
34 yo female c/o rounc itchy rash that started 3 weeks ago on left thigh. Denies drainage. Sts advanced to torso and bilateral arms since. Denies fevers/chills. No recent travel.

## 2019-09-24 NOTE — Discharge Instructions (Signed)
You were evaluated in the Emergency Department and after careful evaluation, we did not find any emergent condition requiring admission or further testing in the hospital.  Your exam/testing today is overall reassuring.  Your symptoms seem consistent with a particular form of eczema.  Please take the steroid cream as directed and follow-up with a dermatologist.  Please return to the Emergency Department if you experience any worsening of your condition.  We encourage you to follow up with a primary care provider.  Thank you for allowing [place] to be a part of your care.

## 2020-04-07 ENCOUNTER — Other Ambulatory Visit: Payer: Self-pay

## 2020-04-07 ENCOUNTER — Emergency Department (HOSPITAL_COMMUNITY)
Admission: EM | Admit: 2020-04-07 | Discharge: 2020-04-07 | Disposition: A | Discharge status: Home / Self Care | Payer: Medicaid Other | Attending: Emergency Medicine | Admitting: Emergency Medicine

## 2020-04-07 ENCOUNTER — Encounter (HOSPITAL_COMMUNITY): Payer: Self-pay | Admitting: Emergency Medicine

## 2020-04-07 DIAGNOSIS — O219 Vomiting of pregnancy, unspecified: Secondary | ICD-10-CM | POA: Insufficient documentation

## 2020-04-07 DIAGNOSIS — Z87891 Personal history of nicotine dependence: Secondary | ICD-10-CM | POA: Insufficient documentation

## 2020-04-07 DIAGNOSIS — Z3A01 Less than 8 weeks gestation of pregnancy: Secondary | ICD-10-CM | POA: Insufficient documentation

## 2020-04-07 DIAGNOSIS — Z20822 Contact with and (suspected) exposure to covid-19: Secondary | ICD-10-CM | POA: Diagnosis not present

## 2020-04-07 LAB — CBC WITH DIFFERENTIAL/PLATELET
Abs Immature Granulocytes: 0.03 10*3/uL (ref 0.00–0.07)
Basophils Absolute: 0.1 10*3/uL (ref 0.0–0.1)
Basophils Relative: 1 %
Eosinophils Absolute: 0.1 10*3/uL (ref 0.0–0.5)
Eosinophils Relative: 1 %
HCT: 37.3 % (ref 36.0–46.0)
Hemoglobin: 11.5 g/dL — ABNORMAL LOW (ref 12.0–15.0)
Immature Granulocytes: 0 %
Lymphocytes Relative: 28 %
Lymphs Abs: 2.5 10*3/uL (ref 0.7–4.0)
MCH: 24.8 pg — ABNORMAL LOW (ref 26.0–34.0)
MCHC: 30.8 g/dL (ref 30.0–36.0)
MCV: 80.6 fL (ref 80.0–100.0)
Monocytes Absolute: 0.6 10*3/uL (ref 0.1–1.0)
Monocytes Relative: 7 %
Neutro Abs: 5.9 10*3/uL (ref 1.7–7.7)
Neutrophils Relative %: 63 %
Platelets: 233 10*3/uL (ref 150–400)
RBC: 4.63 MIL/uL (ref 3.87–5.11)
RDW: 13.5 % (ref 11.5–15.5)
WBC: 9.1 10*3/uL (ref 4.0–10.5)
nRBC: 0 % (ref 0.0–0.2)

## 2020-04-07 LAB — COMPREHENSIVE METABOLIC PANEL
ALT: 12 U/L (ref 0–44)
AST: 16 U/L (ref 15–41)
Albumin: 3.7 g/dL (ref 3.5–5.0)
Alkaline Phosphatase: 33 U/L — ABNORMAL LOW (ref 38–126)
Anion gap: 11 (ref 5–15)
BUN: 5 mg/dL — ABNORMAL LOW (ref 6–20)
CO2: 21 mmol/L — ABNORMAL LOW (ref 22–32)
Calcium: 9 mg/dL (ref 8.9–10.3)
Chloride: 103 mmol/L (ref 98–111)
Creatinine, Ser: 0.62 mg/dL (ref 0.44–1.00)
GFR calc non Af Amer: >60 mL/min (ref >60)
Glucose, Bld: 104 mg/dL — ABNORMAL HIGH (ref 70–99)
Potassium: 4 mmol/L (ref 3.5–5.1)
Sodium: 135 mmol/L (ref 135–145)
Total Bilirubin: 0.6 mg/dL (ref 0.3–1.2)
Total Protein: 6.7 g/dL (ref 6.5–8.1)

## 2020-04-07 LAB — URINALYSIS, ROUTINE W REFLEX MICROSCOPIC
Bilirubin Urine: NEGATIVE
Glucose, UA: NEGATIVE mg/dL
Hgb urine dipstick: NEGATIVE
Ketones, ur: 5 mg/dL — AB
Nitrite: NEGATIVE
Protein, ur: 30 mg/dL — AB
Specific Gravity, Urine: 1.024 (ref 1.005–1.030)
pH: 8 (ref 5.0–8.0)

## 2020-04-07 LAB — RESP PANEL BY RT PCR (RSV, FLU A&B, COVID)
Influenza A by PCR: NEGATIVE
Influenza B by PCR: NEGATIVE
Respiratory Syncytial Virus by PCR: NEGATIVE
SARS Coronavirus 2 by RT PCR: NEGATIVE

## 2020-04-07 LAB — LIPASE, BLOOD: Lipase: 25 U/L (ref 11–51)

## 2020-04-07 LAB — HCG, QUANTITATIVE, PREGNANCY: hCG, Beta Chain, Quant, S: 100854 m[IU]/mL — ABNORMAL HIGH (ref <5)

## 2020-04-07 LAB — I-STAT BETA HCG BLOOD, ED (MC, WL, AP ONLY): I-stat hCG, quantitative: >2000 m[IU]/mL — ABNORMAL HIGH (ref <5)

## 2020-04-07 MED ORDER — SODIUM CHLORIDE 0.9 % IV BOLUS
1000.0000 mL | Freq: Once | INTRAVENOUS | Status: AC
  Administered 2020-04-07: 1000 mL via INTRAVENOUS

## 2020-04-07 MED ORDER — ONDANSETRON 4 MG PO TBDP: 4.0000 mg | ORAL_TABLET | Freq: Three times a day (TID) | ORAL | 0 refills | Status: DC | PRN

## 2020-04-07 MED ORDER — PROCHLORPERAZINE EDISYLATE 10 MG/2ML IJ SOLN
5.0000 mg | Freq: Once | INTRAMUSCULAR | Status: DC
  Filled 2020-04-07: qty 2

## 2020-04-07 MED ORDER — ONDANSETRON HCL 4 MG/2ML IJ SOLN
4.0000 mg | Freq: Once | INTRAMUSCULAR | Status: AC
  Administered 2020-04-07: 4 mg via INTRAVENOUS
  Filled 2020-04-07: qty 2

## 2020-04-07 MED ORDER — SODIUM CHLORIDE 0.9 % IV SOLN: INTRAVENOUS | Status: DC

## 2020-04-07 MED ORDER — FENTANYL CITRATE (PF) 100 MCG/2ML IJ SOLN
25.0000 ug | Freq: Once | INTRAMUSCULAR | Status: DC
  Filled 2020-04-07: qty 2

## 2020-04-07 NOTE — ED Provider Notes (Signed)
[facility] EMERGENCY DEPARTMENT Provider Note   CSN: [id number] Arrival date & time: 04/07/20  3664     History Chief Complaint  Patient presents with  . Emesis    Miranda Morrow is a 34 y.o. female.  HPI Patient presents with nausea, vomiting, generalized discomfort. Onset was 5 days ago, since onset symptoms have been persistent. No focal abdominal pain, though there is an unsettled sensation and nausea. No fever, no cough, no dyspnea. Patient does have one child at home who was diagnosed with Covid earlier this month. She states that she is generally well, was so prior to the onset of symptoms. Since onset no relief with anything.   Past Medical History:  Diagnosis Date  . Abnormal Pap smear of cervix 2006   underwent colposcopy followed by   . Functional ovarian cysts     There are no problems to display for this patient.   Past Surgical History:  Procedure Laterality Date  . COLPOSCOPY  2006  . CRYOTHERAPY  2006     OB History    Gravida  7   Para  4   Term  4   Preterm  0   AB  2   Living  4     SAB  2   TAB  0   Ectopic  0   Multiple  0   Live Births              Family History  Problem Relation Age of Onset  . Hypertension Mother   . Heart disease Mother        also has pacemaker/AICD  . Congestive Heart Failure Mother     Social History   Tobacco Use  . Smoking status: Former Smoker    Years: 3.00    Types: Cigarettes  . Smokeless tobacco: Never Used  Substance Use Topics  . Alcohol use: Yes    Alcohol/week: 0.0 standard drinks    Comment: occ  . Drug use: No    Home Medications Prior to Admission medications   Medication Sig Start Date End Date Taking? Authorizing Provider  ondansetron (ZOFRAN ODT) 4 MG disintegrating tablet Take 1 tablet (4 mg total) by mouth every 8 (eight) hours as needed for nausea or vomiting. 04/07/20   Gerhard Munch, MD    Allergies    Patient has no known  allergies.  Review of Systems   Review of Systems  Constitutional:       Per HPI, otherwise negative  HENT:       Per HPI, otherwise negative  Respiratory:       Per HPI, otherwise negative  Cardiovascular:       Per HPI, otherwise negative  Gastrointestinal: Positive for nausea and vomiting. Negative for diarrhea.  Endocrine:       Negative aside from HPI  Genitourinary:       Neg aside from HPI   Musculoskeletal:       Per HPI, otherwise negative  Skin: Negative.   Neurological: Negative for syncope.    Physical Exam Updated Vital Signs BP 111/76 (BP Location: Right Arm)   Pulse 71   Temp 98.7 F (37.1 C) (Oral)   Resp 16   SpO2 100%   Physical Exam Vitals and nursing note reviewed.  Constitutional:      General: She is not in acute distress.    Appearance: She is well-developed.  HENT:     Head: Normocephalic and atraumatic.  Eyes:  Conjunctiva/sclera: Conjunctivae normal.  Cardiovascular:     Rate and Rhythm: Normal rate and regular rhythm.  Pulmonary:     Effort: Pulmonary effort is normal. No respiratory distress.     Breath sounds: Normal breath sounds. No stridor.  Abdominal:     General: There is no distension.     Tenderness: There is no abdominal tenderness. There is no guarding.  Skin:    General: Skin is warm and dry.  Neurological:     Mental Status: She is alert and oriented to person, place, and time.     Cranial Nerves: No cranial nerve deficit.     ED Results / Procedures / Treatments   Labs (all labs ordered are listed, but only abnormal results are displayed) Labs Reviewed  COMPREHENSIVE METABOLIC PANEL - Abnormal; Notable for the following components:      Result Value   CO2 21 (*)    Glucose, Bld 104 (*)    BUN 5 (*)    Alkaline Phosphatase 33 (*)    All other components within normal limits  CBC WITH DIFFERENTIAL/PLATELET - Abnormal; Notable for the following components:   Hemoglobin 11.5 (*)    MCH 24.8 (*)    All other  components within normal limits  URINALYSIS, ROUTINE W REFLEX MICROSCOPIC - Abnormal; Notable for the following components:   APPearance HAZY (*)    Ketones, ur 5 (*)    Protein, ur 30 (*)    Leukocytes,Ua TRACE (*)    Bacteria, UA RARE (*)    All other components within normal limits  HCG, QUANTITATIVE, PREGNANCY - Abnormal; Notable for the following components:   hCG, Beta Chain, Quant, S 100,854 (*)    All other components within normal limits  I-STAT BETA HCG BLOOD, ED ([facility], [facility], [facility] ONLY) - Abnormal; Notable for the following components:   I-stat hCG, quantitative >2,000.0 (*)    All other components within normal limits  RESP PANEL BY RT PCR (RSV, FLU A&B, COVID)  LIPASE, BLOOD     Procedures Procedures (including critical care time)  Medications Ordered in ED Medications  sodium chloride 0.9 % bolus 1,000 mL (0 mLs Intravenous Stopped 04/07/20 1141)    And  0.9 %  sodium chloride infusion (has no administration in time range)  ondansetron (ZOFRAN) injection 4 mg (4 mg Intravenous Given 04/07/20 0912)    ED Course  I have reviewed the triage vital signs and the nursing notes.  Pertinent labs & imaging results that were available during my care of the patient were reviewed by me and considered in my medical decision making (see chart for details).  On repeat exam the patient is better, having received fluids, Zofran. Initial labs notable for positive hCG.   Update:, Quantitative hCG notable for finding greater than 100,000, suggesting pregnancy is about 5 weeks. On repeat discussion, patient notes that her last menstrual period was slightly more than 1 month ago. She is G3, P2. Symptoms have resolved, following fluids, antiemetics. With no abdominal pain, no fever, low suspicion for ectopic pregnancy, acute abdominal processes, the patient is appropriate for outpatient follow-up with our women's health colleagues.  Final Clinical Impression(s) / ED Diagnoses Final  diagnoses:  Nausea and vomiting in pregnancy    Rx / DC Orders ED Discharge Orders         Ordered    ondansetron (ZOFRAN ODT) 4 MG disintegrating tablet  Every 8 hours PRN        04/07/20 1103  Gerhard Munch, MD 04/07/20 1312

## 2020-04-07 NOTE — ED Notes (Signed)
AVS reviewed with pt who verbalized understanding. Ambulatory out of dept.  

## 2020-04-07 NOTE — Discharge Instructions (Signed)
As discussed, it is important he follow-up with our women's health colleagues for appropriate ongoing care.  Return here for concerning changes in your condition.

## 2020-04-07 NOTE — ED Triage Notes (Signed)
Patient arrives to ED with complaints of nausea, vomiting, diarrhea since Friday. States she feel fatigued and lightheaded today.

## 2020-04-07 NOTE — ED Notes (Signed)
Patient Alert and oriented to baseline. Stable and ambulatory to baseline. Patient verbalized understanding of the discharge instructions.  Patient belongings were taken by the patient.   

## 2020-04-12 ENCOUNTER — Other Ambulatory Visit: Payer: Self-pay

## 2020-04-12 ENCOUNTER — Inpatient Hospital Stay (HOSPITAL_COMMUNITY)
Admission: EM | Admit: 2020-04-12 | Discharge: 2020-04-13 | Disposition: A | Discharge status: Home / Self Care | Payer: Medicaid Other | Attending: Emergency Medicine | Admitting: Emergency Medicine

## 2020-04-12 DIAGNOSIS — R112 Nausea with vomiting, unspecified: Secondary | ICD-10-CM | POA: Insufficient documentation

## 2020-04-12 DIAGNOSIS — N760 Acute vaginitis: Secondary | ICD-10-CM

## 2020-04-12 DIAGNOSIS — O23591 Infection of other part of genital tract in pregnancy, first trimester: Secondary | ICD-10-CM | POA: Diagnosis not present

## 2020-04-12 DIAGNOSIS — Z87891 Personal history of nicotine dependence: Secondary | ICD-10-CM | POA: Diagnosis not present

## 2020-04-12 DIAGNOSIS — O26891 Other specified pregnancy related conditions, first trimester: Secondary | ICD-10-CM | POA: Diagnosis present

## 2020-04-12 DIAGNOSIS — Z3A01 Less than 8 weeks gestation of pregnancy: Secondary | ICD-10-CM | POA: Diagnosis not present

## 2020-04-12 DIAGNOSIS — O219 Vomiting of pregnancy, unspecified: Secondary | ICD-10-CM

## 2020-04-12 DIAGNOSIS — O26851 Spotting complicating pregnancy, first trimester: Secondary | ICD-10-CM | POA: Insufficient documentation

## 2020-04-12 DIAGNOSIS — Z3490 Encounter for supervision of normal pregnancy, unspecified, unspecified trimester: Secondary | ICD-10-CM

## 2020-04-12 DIAGNOSIS — B9689 Other specified bacterial agents as the cause of diseases classified elsewhere: Secondary | ICD-10-CM | POA: Diagnosis present

## 2020-04-12 DIAGNOSIS — O26859 Spotting complicating pregnancy, unspecified trimester: Secondary | ICD-10-CM

## 2020-04-12 LAB — CBC
HCT: 37.8 % (ref 36.0–46.0)
Hemoglobin: 11.7 g/dL — ABNORMAL LOW (ref 12.0–15.0)
MCH: 24.8 pg — ABNORMAL LOW (ref 26.0–34.0)
MCHC: 31 g/dL (ref 30.0–36.0)
MCV: 80.3 fL (ref 80.0–100.0)
Platelets: 255 10*3/uL (ref 150–400)
RBC: 4.71 MIL/uL (ref 3.87–5.11)
RDW: 13.2 % (ref 11.5–15.5)
WBC: 12 10*3/uL — ABNORMAL HIGH (ref 4.0–10.5)
nRBC: 0 % (ref 0.0–0.2)

## 2020-04-12 LAB — COMPREHENSIVE METABOLIC PANEL
ALT: 9 U/L (ref 0–44)
AST: 15 U/L (ref 15–41)
Albumin: 3.7 g/dL (ref 3.5–5.0)
Alkaline Phosphatase: 41 U/L (ref 38–126)
Anion gap: 11 (ref 5–15)
BUN: 6 mg/dL (ref 6–20)
CO2: 22 mmol/L (ref 22–32)
Calcium: 9.3 mg/dL (ref 8.9–10.3)
Chloride: 100 mmol/L (ref 98–111)
Creatinine, Ser: 0.54 mg/dL (ref 0.44–1.00)
GFR, Estimated: >60 mL/min (ref >60)
Glucose, Bld: 73 mg/dL (ref 70–99)
Potassium: 3.9 mmol/L (ref 3.5–5.1)
Sodium: 133 mmol/L — ABNORMAL LOW (ref 135–145)
Total Bilirubin: 0.3 mg/dL (ref 0.3–1.2)
Total Protein: 6.9 g/dL (ref 6.5–8.1)

## 2020-04-12 LAB — URINALYSIS, ROUTINE W REFLEX MICROSCOPIC
Bilirubin Urine: NEGATIVE
Glucose, UA: NEGATIVE mg/dL
Hgb urine dipstick: NEGATIVE
Ketones, ur: 80 mg/dL — AB
Leukocytes,Ua: NEGATIVE
Nitrite: NEGATIVE
Protein, ur: NEGATIVE mg/dL
Specific Gravity, Urine: 1.025 (ref 1.005–1.030)
pH: 6 (ref 5.0–8.0)

## 2020-04-12 LAB — LIPASE, BLOOD: Lipase: 23 U/L (ref 11–51)

## 2020-04-12 LAB — I-STAT BETA HCG BLOOD, ED (MC, WL, AP ONLY): I-stat hCG, quantitative: >2000 m[IU]/mL — ABNORMAL HIGH (ref <5)

## 2020-04-12 NOTE — ED Triage Notes (Signed)
Pt presents to ED POV. Pt c/o emesis and R flank pain. Pt reports that she has been vomiting for "a while" and could not give a specific time. Denies any other GI/GU complaints

## 2020-04-13 ENCOUNTER — Inpatient Hospital Stay (HOSPITAL_COMMUNITY): Payer: Medicaid Other

## 2020-04-13 ENCOUNTER — Encounter (HOSPITAL_COMMUNITY): Payer: Self-pay

## 2020-04-13 DIAGNOSIS — Z3490 Encounter for supervision of normal pregnancy, unspecified, unspecified trimester: Secondary | ICD-10-CM

## 2020-04-13 DIAGNOSIS — O26859 Spotting complicating pregnancy, unspecified trimester: Secondary | ICD-10-CM | POA: Diagnosis present

## 2020-04-13 DIAGNOSIS — B9689 Other specified bacterial agents as the cause of diseases classified elsewhere: Secondary | ICD-10-CM | POA: Diagnosis present

## 2020-04-13 DIAGNOSIS — O219 Vomiting of pregnancy, unspecified: Secondary | ICD-10-CM | POA: Diagnosis present

## 2020-04-13 LAB — WET PREP, GENITAL
Sperm: NONE SEEN
Trich, Wet Prep: NONE SEEN
Yeast Wet Prep HPF POC: NONE SEEN

## 2020-04-13 LAB — GC/CHLAMYDIA PROBE AMP (~~LOC~~) NOT AT ARMC
Chlamydia: NEGATIVE
Comment: NEGATIVE
Comment: NORMAL
Neisseria Gonorrhea: NEGATIVE

## 2020-04-13 LAB — HCG, QUANTITATIVE, PREGNANCY: hCG, Beta Chain, Quant, S: 186648 m[IU]/mL — ABNORMAL HIGH (ref <5)

## 2020-04-13 MED ORDER — PROMETHAZINE HCL 25 MG PO TABS: 25.0000 mg | ORAL_TABLET | Freq: Four times a day (QID) | ORAL | 3 refills | Status: DC | PRN

## 2020-04-13 MED ORDER — PROMETHAZINE HCL 25 MG/ML IJ SOLN
25.0000 mg | Freq: Once | INTRAVENOUS | Status: AC
  Administered 2020-04-13: 25 mg via INTRAVENOUS
  Filled 2020-04-13: qty 1

## 2020-04-13 MED ORDER — FAMOTIDINE IN NACL 20-0.9 MG/50ML-% IV SOLN
20.0000 mg | Freq: Once | INTRAVENOUS | Status: AC
  Administered 2020-04-13: 20 mg via INTRAVENOUS
  Filled 2020-04-13: qty 50

## 2020-04-13 MED ORDER — M.V.I. ADULT IV INJ
Freq: Once | INTRAVENOUS | Status: AC
  Filled 2020-04-13: qty 1000

## 2020-04-13 MED ORDER — METRONIDAZOLE 500 MG PO TABS: 500.0000 mg | ORAL_TABLET | Freq: Two times a day (BID) | ORAL | 0 refills | Status: DC

## 2020-04-13 NOTE — ED Provider Notes (Signed)
[facility] EMERGENCY DEPARTMENT Provider Note   CSN: [id number] Arrival date & time: 04/12/20  1802     History Chief Complaint  Patient presents with  . Emesis  . Flank Pain    Miranda Morrow is a 34 y.o. female.  The history is provided by the patient and medical records.  Emesis Flank Pain    34 y.o. G8P4 approx [redacted] weeks gestation presenting to the ED with nausea, vomiting, and now vaginal beeding.  States she was seen in the ED 04/07/20 and found out she was pregnant.  She has been taking zofran at home without relief.  States she has had lower abdominal cramping.  Denies urinary symptoms.  States she went to the bathroom while in the lobby and started to have vaginal spotting.  She reports she sustained a miscarriage years ago.  No hx of ectopic pregnancy.  She is not taking prenatal vitamins, states she has never taken them.  Past Medical History:  Diagnosis Date  . Abnormal Pap smear of cervix 2006   underwent colposcopy followed by   . Functional ovarian cysts     There are no problems to display for this patient.   Past Surgical History:  Procedure Laterality Date  . COLPOSCOPY  2006  . CRYOTHERAPY  2006     OB History    Gravida  7   Para  4   Term  4   Preterm  0   AB  2   Living  4     SAB  2   TAB  0   Ectopic  0   Multiple  0   Live Births              Family History  Problem Relation Age of Onset  . Hypertension Mother   . Heart disease Mother        also has pacemaker/AICD  . Congestive Heart Failure Mother     Social History   Tobacco Use  . Smoking status: Former Smoker    Years: 3.00    Types: Cigarettes  . Smokeless tobacco: Never Used  Substance Use Topics  . Alcohol use: Yes    Alcohol/week: 0.0 standard drinks    Comment: occ  . Drug use: No    Home Medications Prior to Admission medications   Medication Sig Start Date End Date Taking? Authorizing Provider  ondansetron (ZOFRAN  ODT) 4 MG disintegrating tablet Take 1 tablet (4 mg total) by mouth every 8 (eight) hours as needed for nausea or vomiting. 04/07/20   Gerhard Munch, MD    Allergies    Patient has no known allergies.  Review of Systems   Review of Systems  Gastrointestinal: Positive for nausea and vomiting.  Genitourinary: Positive for flank pain and vaginal bleeding.  All other systems reviewed and are negative.   Physical Exam Updated Vital Signs BP 117/72   Pulse 67   Temp 98.6 F (37 C) (Oral)   Resp 16   SpO2 100%   Physical Exam Vitals and nursing note reviewed.  Constitutional:      Appearance: She is well-developed.     Comments: Crying, vomiting into emesis bag  HENT:     Head: Normocephalic and atraumatic.  Eyes:     Conjunctiva/sclera: Conjunctivae normal.     Pupils: Pupils are equal, round, and reactive to light.  Cardiovascular:     Rate and Rhythm: Normal rate and regular rhythm.  Heart sounds: Normal heart sounds.  Pulmonary:     Effort: Pulmonary effort is normal.     Breath sounds: Normal breath sounds. No stridor. No wheezing.  Abdominal:     General: Bowel sounds are normal.     Palpations: Abdomen is soft.     Tenderness: There is no abdominal tenderness. There is no rebound.  Musculoskeletal:        General: Normal range of motion.     Cervical back: Normal range of motion.  Skin:    General: Skin is warm and dry.  Neurological:     Mental Status: She is alert and oriented to person, place, and time.     ED Results / Procedures / Treatments   Labs (all labs ordered are listed, but only abnormal results are displayed) Labs Reviewed  COMPREHENSIVE METABOLIC PANEL - Abnormal; Notable for the following components:      Result Value   Sodium 133 (*)    All other components within normal limits  CBC - Abnormal; Notable for the following components:   WBC 12.0 (*)    Hemoglobin 11.7 (*)    MCH 24.8 (*)    All other components within normal limits    URINALYSIS, ROUTINE W REFLEX MICROSCOPIC - Abnormal; Notable for the following components:   Ketones, ur 80 (*)    All other components within normal limits  I-STAT BETA HCG BLOOD, ED (MC, WL, AP ONLY) - Abnormal; Notable for the following components:   I-stat hCG, quantitative >2,000.0 (*)    All other components within normal limits  LIPASE, BLOOD  HCG, QUANTITATIVE, PREGNANCY  RH IG WORKUP (INCLUDES ABO/RH)    EKG None  Radiology No results found.  Procedures Procedures (including critical care time)  Medications Ordered in ED Medications - No data to display  ED Course  I have reviewed the triage vital signs and the nursing notes.  Pertinent labs & imaging results that were available during my care of the patient were reviewed by me and considered in my medical decision making (see chart for details).    MDM Rules/Calculators/A&P  34 y.o. G8P4 approx [redacted] weeks gestation here with persistent nausea, vomiting, and now vaginal bleeding.  Has been taking zofran from prior ED visit without relief.  She is afebrile, non-toxic, HD stable.  Vaginal bleeding began while waiting in the lobby.  Labs grossly reassuring.  I-stat hcg >2000.  Lab value and rh IG work-up pending.    Discussed with on call OB-GYN, Dr. Debroah Loop-- will transfer to MAU for further evaluation.  Final Clinical Impression(s) / ED Diagnoses Final diagnoses:  Nausea and vomiting in pregnancy  Vaginal bleeding in pregnancy    Rx / DC Orders ED Discharge Orders    None       Garlon Hatchet, PA-C 04/13/20 0223    Dione Booze, MD 04/13/20 0630

## 2020-04-13 NOTE — MAU Note (Signed)
.   Miranda Morrow is a 34 y.o. here in MAU reporting: nausea and vomiting that started x2 weeks ago. She has not been able to keep anything down so she came back to the ER for treatment. Has not had anything for nausea and has not been seen for her pregnancy yet. Unsure of her LMP. Reports vaginal bleeding this afternoon while she was in the ED. Patient also reports right sided flank pain.   Pain score: 5 Vitals:   04/13/20 0208 04/13/20 0316  BP: (!) 101/49 113/63  Pulse: 65 67  Resp: 16 16  Temp: 98.9 F (37.2 C) 98.4 F (36.9 C)  SpO2: 100% 100%      Lab orders placed from triage: UA

## 2020-04-13 NOTE — MAU Provider Note (Signed)
History     CSN: [id number]  Arrival date and time: 04/12/20 2993   First Provider Initiated Contact with Patient 04/13/20 0320      Chief Complaint  Patient presents with  . Emesis  . Flank Pain   [name] is a 34 y.o. year old G7P4024 female at Unknown weeks gestation who was sent to MAU from [facility] reporting N/V x 2 wks. She has been taking Zofran, "but it hasn't really worked." She has not taken the Zofran in a couple of days, because it hasn't worked. She has tried drinking gingerale and water as much as she can. Every time she takes a couple of bites of food, she feels like she is going to throw up. She is unsure of her LMP. She complains of lower RT side pain; rated 5/10.   OB History    Gravida  8   Para  4   Term  4   Preterm  0   AB  2   Living  4     SAB  2   TAB  0   Ectopic  0   Multiple  0   Live Births              Past Medical History:  Diagnosis Date  . Abnormal Pap smear of cervix 2006   underwent colposcopy followed by   . Functional ovarian cysts     Past Surgical History:  Procedure Laterality Date  . COLPOSCOPY  2006  . CRYOTHERAPY  2006    Family History  Problem Relation Age of Onset  . Hypertension Mother   . Heart disease Mother        also has pacemaker/AICD  . Congestive Heart Failure Mother     Social History   Tobacco Use  . Smoking status: Former Smoker    Years: 3.00    Types: Cigarettes  . Smokeless tobacco: Never Used  Substance Use Topics  . Alcohol use: Yes    Alcohol/week: 0.0 standard drinks    Comment: occ  . Drug use: No    Allergies: No Known Allergies  Medications Prior to Admission  Medication Sig Dispense Refill Last Dose  . ondansetron (ZOFRAN ODT) 4 MG disintegrating tablet Take 1 tablet (4 mg total) by mouth every 8 (eight) hours as needed for nausea or vomiting. 20 tablet 0 Past Week at Unknown time    Review of Systems  Constitutional: Positive for appetite change.   HENT: Negative.   Eyes: Negative.   Respiratory: Negative.   Cardiovascular: Negative.   Gastrointestinal: Positive for nausea and vomiting.  Endocrine: Negative.   Genitourinary: Positive for pelvic pain (lower RT side) and vaginal bleeding (while waiting in the ED lobby this evening after 5 pm).  Musculoskeletal: Negative.   Skin: Negative.   Allergic/Immunologic: Negative.   Neurological: Negative.   Hematological: Negative.   Psychiatric/Behavioral: Negative.    Physical Exam   Blood pressure 113/63, pulse 67, temperature 98.4 F (36.9 C), temperature source Oral, resp. rate 16, height 4\' 11"  (1.499 m), weight 58.4 kg, last menstrual period 09/21/2019, SpO2 100 %.  Physical Exam Vitals and nursing note reviewed. Exam conducted with a chaperone present.  Constitutional:      Appearance: Normal appearance. She is normal weight.  HENT:     Head: Normocephalic and atraumatic.  Eyes:     Pupils: Pupils are equal, round, and reactive to light.  Cardiovascular:     Rate and Rhythm: Normal  rate.  Pulmonary:     Effort: Pulmonary effort is normal.  Abdominal:     General: Abdomen is flat.     Palpations: Abdomen is soft.     Tenderness: There is no abdominal tenderness. There is no guarding.  Genitourinary:    General: Normal vulva.     Comments: Uterus: non-tender, SE: cervix is smooth, pink, no lesions, scant amt of tannish-pink vaginal d/c -- WP, GC/CT done, closed/long/firm, no CMT or friability, no adnexal tenderness  Musculoskeletal:        General: Normal range of motion.     Cervical back: Normal range of motion.  Skin:    General: Skin is warm and dry.  Neurological:     Mental Status: She is alert and oriented to person, place, and time.  Psychiatric:        Mood and Affect: Mood normal.        Behavior: Behavior normal.        Thought Content: Thought content normal.        Judgment: Judgment normal.     MAU Course  Procedures  MDM Wet Prep GC/CT --  Results pending  OB U/S <14 wks with TV IVFs: Phenergan 25 mg in LR 1000 ml @ 999 ml/hr; followed by MVI in LR 1000 ml @ 999 ml/hr -- no nausea/vomiting Pepcid 20 mg IVPB PO Challenge -- patient tolerated well   Results for orders placed or performed during the hospital encounter of 04/12/20 (from the past 24 hour(s))  Urinalysis, Routine w reflex microscopic Urine, Clean Catch     Status: Abnormal   Collection Time: 04/12/20  7:15 PM  Result Value Ref Range   Color, Urine YELLOW YELLOW   APPearance CLEAR CLEAR   Specific Gravity, Urine 1.025 1.005 - 1.030   pH 6.0 5.0 - 8.0   Glucose, UA NEGATIVE NEGATIVE mg/dL   Hgb urine dipstick NEGATIVE NEGATIVE   Bilirubin Urine NEGATIVE NEGATIVE   Ketones, ur 80 (A) NEGATIVE mg/dL   Protein, ur NEGATIVE NEGATIVE mg/dL   Nitrite NEGATIVE NEGATIVE   Leukocytes,Ua NEGATIVE NEGATIVE  Lipase, blood     Status: None   Collection Time: 04/12/20  8:48 PM  Result Value Ref Range   Lipase 23 11 - 51 U/L  Comprehensive metabolic panel     Status: Abnormal   Collection Time: 04/12/20  8:48 PM  Result Value Ref Range   Sodium 133 (L) 135 - 145 mmol/L   Potassium 3.9 3.5 - 5.1 mmol/L   Chloride 100 98 - 111 mmol/L   CO2 22 22 - 32 mmol/L   Glucose, Bld 73 70 - 99 mg/dL   BUN 6 6 - 20 mg/dL   Creatinine, Ser 4.17 0.44 - 1.00 mg/dL   Calcium 9.3 8.9 - 40.8 mg/dL   Total Protein 6.9 6.5 - 8.1 g/dL   Albumin 3.7 3.5 - 5.0 g/dL   AST 15 15 - 41 U/L   ALT 9 0 - 44 U/L   Alkaline Phosphatase 41 38 - 126 U/L   Total Bilirubin 0.3 0.3 - 1.2 mg/dL   GFR, Estimated >14 >48 mL/min   Anion gap 11 5 - 15  CBC     Status: Abnormal   Collection Time: 04/12/20  8:48 PM  Result Value Ref Range   WBC 12.0 (H) 4.0 - 10.5 K/uL   RBC 4.71 3.87 - 5.11 MIL/uL   Hemoglobin 11.7 (L) 12.0 - 15.0 g/dL   HCT 18.5 36 - 46 %  MCV 80.3 80.0 - 100.0 fL   MCH 24.8 (L) 26.0 - 34.0 pg   MCHC 31.0 30.0 - 36.0 g/dL   RDW 70.1 77.9 - 39.0 %   Platelets 255 150 - 400 K/uL    nRBC 0.0 0.0 - 0.2 %  hCG, quantitative, pregnancy     Status: Abnormal   Collection Time: 04/12/20  8:48 PM  Result Value Ref Range   hCG, Beta Chain, Quant, S 300,923 (H) <5 mIU/mL  I-Stat beta hCG blood, ED     Status: Abnormal   Collection Time: 04/12/20 10:13 PM  Result Value Ref Range   I-stat hCG, quantitative >2,000.0 (H) <5 mIU/mL   Comment 3          Wet prep, genital     Status: Abnormal   Collection Time: 04/13/20  3:46 AM  Result Value Ref Range   Yeast Wet Prep HPF POC NONE SEEN NONE SEEN   Trich, Wet Prep NONE SEEN NONE SEEN   Clue Cells Wet Prep HPF POC PRESENT (A) NONE SEEN   WBC, Wet Prep HPF POC MANY (A) NONE SEEN   Sperm NONE SEEN     US OB Comp Less 14 Wks  Result Date: 04/13/2020 CLINICAL DATA:  Positive pregnancy test with right flank pain. Vaginal bleeding. EXAM: OBSTETRIC <14 WK [place] AND TRANSVAGINAL OB US TECHNIQUE: Both transabdominal and transvaginal ultrasound examinations were performed for complete evaluation of the gestation as well as the maternal uterus, adnexal regions, and pelvic cul-de-sac. Transvaginal technique was performed to assess early pregnancy. COMPARISON:  None. FINDINGS: Intrauterine gestational sac: Single Yolk sac:  Visualized. Embryo:  Visualized. Cardiac Activity: Visualized. Heart Rate: 147 bpm CRL: 12 mm   7 w   2 d                  [place] EDC: 11/28/2020 Subchorionic hemorrhage:  None visualized. Maternal uterus/adnexae: Minimally complex 4 cm cyst in the right ovary may represent hemorrhagic corpus luteum. No intraperitoneal free fluid. IMPRESSION: Single living intrauterine gestation at estimated 7 week 2 day gestational age by crown-rump length. Minimally complex 4 cm right ovarian cyst. Attention on follow-up recommended. Electronically Signed   By: Kennith Center M.D.   On: 04/13/2020 05:12    Assessment and Plan  Nausea and vomiting in pregnancy  - Information provided on morning sickness - Rx for Phenergan 25 mg po every 6 hours prn  N/V   Spotting in early pregnancy  - Information provided on VB in 1st trimester pregnancy   Bacterial vaginosis - Information provided on BV  - Rx for Flagyl 500 mg BID x 7 days - Advised to take anti-emetic 30-60 mins prior to eating and taking Flagyl   Intrauterine pregnancy - Advised to start [facility] - [facility] of [facility] OB Providers given  [redacted] weeks gestation of pregnancy   - Discharge patient - Patient verbalized an understanding of the plan of care and agrees.   Raelyn Mora, MSN, CNM 04/13/2020, 3:34 AM

## 2020-04-13 NOTE — Discharge Instructions (Signed)
Safe Medications in Pregnancy   Acne: Benzoyl Peroxide Salicylic Acid  Backache/Headache: Tylenol: 2 regular strength every 4 hours OR              2 Extra strength every 6 hours  Colds/Coughs/Allergies: Benadryl (alcohol free) 25 mg every 6 hours as needed Breath right strips Claritin Cepacol throat lozenges Chloraseptic throat spray Cold-Eeze- up to three times per day Cough drops, alcohol free Flonase (by prescription only) Guaifenesin Mucinex Robitussin DM (plain only, alcohol free) Saline nasal spray/drops Sudafed (pseudoephedrine) & Actifed ** use only after [redacted] weeks gestation and if you do not have high blood pressure Tylenol Vicks Vaporub Zinc lozenges Zyrtec   Constipation: Colace Ducolax suppositories Fleet enema Glycerin suppositories Metamucil Milk of magnesia Miralax Senokot Smooth move tea  Diarrhea: Kaopectate Imodium A-D  *NO pepto Bismol  Hemorrhoids: Anusol Anusol HC Preparation H Tucks  Indigestion: Tums Maalox Mylanta Zantac  Pepcid  Insomnia: Benadryl (alcohol free) 25mg  every 6 hours as needed Tylenol PM Unisom, no Gelcaps  Leg Cramps: Tums MagGel  Nausea/Vomiting:  Bonine Dramamine Emetrol Ginger extract Sea bands Meclizine  Nausea medication to take during pregnancy:  Unisom (doxylamine succinate 25 mg tablets) Take one tablet daily at bedtime. If symptoms are not adequately controlled, the dose can be increased to a maximum recommended dose of two tablets daily (1/2 tablet in the morning, 1/2 tablet mid-afternoon and one at bedtime). Vitamin B6 100mg  tablets. Take one tablet twice a day (up to 200 mg per day).  Skin Rashes: Aveeno products Benadryl cream or 25mg  every 6 hours as needed Calamine Lotion 1% cortisone cream  Yeast infection: Gyne-lotrimin 7 Monistat 7   **If taking multiple medications, please check labels to avoid duplicating the same active ingredients **take medication as directed on  the label ** Do not exceed 4000 mg of tylenol in 24 hours **Do not take medications that contain aspirin or ibuprofen   [facility] Prenatal Care Providers          [facility] for [facility] locations:  Hours may vary. Please call for an appointment  Center for [facility] @ [facility]  [address] (corner of [address] and [address])  [phone]  Center for [facility] @ [facility]   [address]  [phone]  [facility] @ [facility]       [address] [phone]            [facility] for [facility] @ [place]     [phone]          [facility] for [facility] @ [facility]   [address] [phone]  [facility] for [facility] @ [facility]  [address] [place] [phone]     Center for [facility] @ [facility])  [address]   [phone]

## 2020-04-23 ENCOUNTER — Inpatient Hospital Stay (HOSPITAL_COMMUNITY)
Admission: AD | Admit: 2020-04-23 | Discharge: 2020-04-23 | Disposition: A | Discharge status: Home / Self Care | Payer: Medicaid Other | Attending: Obstetrics & Gynecology | Admitting: Obstetrics & Gynecology

## 2020-04-23 ENCOUNTER — Other Ambulatory Visit: Payer: Self-pay

## 2020-04-23 ENCOUNTER — Encounter (HOSPITAL_COMMUNITY): Payer: Self-pay | Admitting: Obstetrics & Gynecology

## 2020-04-23 DIAGNOSIS — F1290 Cannabis use, unspecified, uncomplicated: Secondary | ICD-10-CM

## 2020-04-23 DIAGNOSIS — O99321 Drug use complicating pregnancy, first trimester: Secondary | ICD-10-CM | POA: Diagnosis not present

## 2020-04-23 DIAGNOSIS — Z87891 Personal history of nicotine dependence: Secondary | ICD-10-CM | POA: Diagnosis not present

## 2020-04-23 DIAGNOSIS — Z3A08 8 weeks gestation of pregnancy: Secondary | ICD-10-CM | POA: Insufficient documentation

## 2020-04-23 DIAGNOSIS — O26891 Other specified pregnancy related conditions, first trimester: Secondary | ICD-10-CM | POA: Insufficient documentation

## 2020-04-23 DIAGNOSIS — O219 Vomiting of pregnancy, unspecified: Secondary | ICD-10-CM | POA: Insufficient documentation

## 2020-04-23 DIAGNOSIS — R109 Unspecified abdominal pain: Secondary | ICD-10-CM | POA: Diagnosis not present

## 2020-04-23 DIAGNOSIS — F12188 Cannabis abuse with other cannabis-induced disorder: Secondary | ICD-10-CM

## 2020-04-23 DIAGNOSIS — Z3A01 Less than 8 weeks gestation of pregnancy: Secondary | ICD-10-CM

## 2020-04-23 LAB — RAPID URINE DRUG SCREEN, HOSP PERFORMED
Amphetamines: NOT DETECTED
Barbiturates: NOT DETECTED
Benzodiazepines: NOT DETECTED
Cocaine: NOT DETECTED
Opiates: NOT DETECTED
Tetrahydrocannabinol: POSITIVE — AB

## 2020-04-23 LAB — URINALYSIS, ROUTINE W REFLEX MICROSCOPIC
Bilirubin Urine: NEGATIVE
Glucose, UA: NEGATIVE mg/dL
Ketones, ur: 80 mg/dL — AB
Leukocytes,Ua: NEGATIVE
Nitrite: NEGATIVE
Protein, ur: NEGATIVE mg/dL
Specific Gravity, Urine: 1.024 (ref 1.005–1.030)
pH: 5 (ref 5.0–8.0)

## 2020-04-23 MED ORDER — M.V.I. ADULT IV INJ
Freq: Once | INTRAVENOUS | Status: AC
  Filled 2020-04-23: qty 10

## 2020-04-23 MED ORDER — PROMETHAZINE HCL 25 MG/ML IJ SOLN
25.0000 mg | Freq: Once | INTRAMUSCULAR | Status: AC
  Administered 2020-04-23: 25 mg via INTRAVENOUS
  Filled 2020-04-23: qty 1

## 2020-04-23 MED ORDER — LACTATED RINGERS IV BOLUS
1000.0000 mL | Freq: Once | INTRAVENOUS | Status: AC
  Administered 2020-04-23: 1000 mL via INTRAVENOUS

## 2020-04-23 MED ORDER — FAMOTIDINE IN NACL 20-0.9 MG/50ML-% IV SOLN
20.0000 mg | Freq: Once | INTRAVENOUS | Status: AC
  Administered 2020-04-23: 20 mg via INTRAVENOUS
  Filled 2020-04-23: qty 50

## 2020-04-23 NOTE — Discharge Instructions (Signed)
Cannabinoid Hyperemesis Syndrome Cannabinoid hyperemesis syndrome (CHS) is a condition that causes repeated nausea, vomiting, and abdominal pain after long-term (chronic) use of marijuana (cannabis). People with CHS typically use marijuana 3-5 times a day for many years before they have symptoms, although it is possible to develop CHS with as little as 1 use per day. Symptoms of CHS may be mild at first but can get worse and more frequent. In some cases, CHS may cause vomiting many times a day, which can lead to weight loss and dehydration. CHS may go away and come back many times (recur). People may not have symptoms or may otherwise be healthy in between CHS attacks. What are the causes? The exact cause of this condition is not known. Long-term use of marijuana may over-stimulate certain proteins in the brain that react with chemicals in marijuana (cannabinoid receptors). This over-stimulation may cause CHS. What are the signs or symptoms? Symptoms of this condition are often mild during the first few attacks, but they can get worse over time. Symptoms may include:  Frequent nausea, especially early in the morning.  Vomiting.  Abdominal pain. Taking several hot showers throughout the day can also be a sign of this condition. People with CHS may do this because it relieves symptoms. How is this diagnosed? This condition may be diagnosed based on:  Your symptoms and medical history, including any drug use.  A physical exam. You may have tests done to rule out other problems. These tests may include:  Blood tests.  Urine tests.  Imaging tests, such as an X-ray or CT scan. How is this treated? Treatment for this condition involves stopping marijuana use. Your health care provider may recommend:  A drug rehabilitation program, if you have trouble stopping marijuana use.  Medicines for nausea.  Hot showers to help relieve symptoms. Certain creams that contain a substance called  capsaicin may improve symptoms when applied to the abdomen. Ask your health care provider before starting any medicines or other treatments. Severe nausea and vomiting may require you to stay at the hospital. You may need IV fluids to prevent or treat dehydration. You may also need certain medicines that must be given at the hospital. Follow these instructions at home: During an attack   Stay in bed and rest in a dark, quiet room.  Take anti-nausea medicine as told by your health care provider.  Try taking hot showers to relieve your symptoms. After an attack  Drink small amounts of clear fluids slowly. Gradually add more.  Once you are able to eat without vomiting, eat soft foods in small amounts every 3-4 hours. General instructions   Do not use any products that contain marijuana.If you need help quitting, ask your health care provider for resources and treatment options.  Drink enough fluid to keep your urine pale yellow. Avoid drinking fluids that have a lot of sugar or caffeine, such as coffee and soda.  Take and apply over-the-counter and prescription medicines only as told by your health care provider. Ask your health care provider before starting any new medicines or treatments.  Keep all follow-up visits as told by your health care provider. This is important. Contact a health care provider if:  Your symptoms get worse.  You cannot drink fluids without vomiting.  You have pain and trouble swallowing after an attack. Get help right away if:  You cannot stop vomiting.  You have blood in your vomit or your vomit looks like coffee grounds.  You have   severe abdominal pain.  You have stools that are bloody or black, or stools that look like tar.  You have symptoms of dehydration, such as: ? Sunken eyes. ? Inability to make tears. ? Cracked lips. ? Dry mouth. ? Decreased urine production. ? Weakness. ? Sleepiness. ? Fainting. Summary  Cannabinoid hyperemesis  syndrome (CHS) is a condition that causes repeated nausea, vomiting, and abdominal pain after long-term use of marijuana.  People with CHS typically use marijuana 3-5 times a day for many years before they have symptoms, although it is possible to develop CHS with as little as 1 use per day.  Treatment for this condition involves stopping marijuana use. Hot showers and capsaicin creams may also help relieve symptoms. Ask your health care provider before starting any medicines or other treatments.  Your health care provider may prescribe medicines to help with nausea.  Get help right away if you have signs of dehydration, such as dry mouth, decreased urine production, or weakness. This information is not intended to replace advice given to you by your health care provider. Make sure you discuss any questions you have with your health care provider. Document Revised: 10/26/2017 Document Reviewed: 09/27/2016 Elsevier Patient Education  2020 [employer]  

## 2020-04-23 NOTE — MAU Provider Note (Signed)
History     CSN: [id number]  Arrival date and time: 04/23/20 3354   First Provider Initiated Contact with Patient 04/23/20 0957      Chief Complaint  Patient presents with  . Abdominal Pain  . Nausea   HPI Miranda Morrow is a 34 y.o. [phone] with confirmed SIUP at [redacted]w[redacted]d. She presents to MAU with chief complaint of recurrent nausea, vomiting and abdominal cramping. She was evaluated for this complaint in [facility] on 04/07/2020 and in MAU 04/13/2020. She previously managed her vomiting with Zofran with limited success. She was subsequently prescribed Phenergan and uses it intermittently. 24 hour diet recall includes bojangles fried chicken and french fries. She estimates she has vomited more than 10 times in 24 hours. She drove her kids to school this morning and endorses needing to pull over to vomit multiple times during her commute to school.  Patient also s/o abdominal cramping and pressure without bleeding, abnormal vaginal discharge, or urinary symptoms.   Patient is scheduled for New OB appt at [facility].  OB History    Gravida  8   Para  4   Term  4   Preterm  0   AB  2   Living  4     SAB  2   TAB  0   Ectopic  0   Multiple  0   Live Births              Past Medical History:  Diagnosis Date  . Abnormal Pap smear of cervix 2006   underwent colposcopy followed by   . Functional ovarian cysts     Past Surgical History:  Procedure Laterality Date  . COLPOSCOPY  2006  . CRYOTHERAPY  2006    Family History  Problem Relation Age of Onset  . Hypertension Mother   . Heart disease Mother        also has pacemaker/AICD  . Congestive Heart Failure Mother     Social History   Tobacco Use  . Smoking status: Former Smoker    Years: 3.00    Types: Cigarettes  . Smokeless tobacco: Never Used  Substance Use Topics  . Alcohol use: Not Currently    Alcohol/week: 0.0 standard drinks    Comment: occ  . Drug use: No    Allergies: No Known  Allergies  Medications Prior to Admission  Medication Sig Dispense Refill Last Dose  . metroNIDAZOLE (FLAGYL) 500 MG tablet Take 1 tablet (500 mg total) by mouth 2 (two) times daily. Take after eating and 30 minutes after anti-nausea medication 14 tablet 0 Past Week at Unknown time  . promethazine (PHENERGAN) 25 MG tablet Take 1 tablet (25 mg total) by mouth every 6 (six) hours as needed for nausea or vomiting. 30 tablet 3 04/22/2020 at 1900  . ondansetron (ZOFRAN ODT) 4 MG disintegrating tablet Take 1 tablet (4 mg total) by mouth every 8 (eight) hours as needed for nausea or vomiting. 20 tablet 0  at Unknown time    Review of Systems  Gastrointestinal: Positive for abdominal pain, nausea and vomiting.  Genitourinary: Negative for vaginal bleeding, vaginal discharge and vaginal pain.  Musculoskeletal: Negative for back pain.  All other systems reviewed and are negative.  Physical Exam   Blood pressure 117/69, pulse 68, temperature 98.6 F (37 C), temperature source Oral, resp. rate 16, weight 58 kg, last menstrual period 09/21/2019, SpO2 98 %.  Physical Exam Vitals and nursing note reviewed. Exam conducted with a chaperone  present.  Constitutional:      Appearance: She is ill-appearing.  Cardiovascular:     Rate and Rhythm: Normal rate.     Heart sounds: Normal heart sounds.  Pulmonary:     Effort: Pulmonary effort is normal.     Breath sounds: Normal breath sounds.  Abdominal:     General: Bowel sounds are normal.     Palpations: Abdomen is soft.     Tenderness: There is no abdominal tenderness.  Skin:    General: Skin is warm and dry.     Capillary Refill: Capillary refill takes less than 2 seconds.  Neurological:     Mental Status: She is alert.     MAU Course  Procedures  --Returned to bedside 1350. Patient sleeping when CNM entered room, denies improved status. + THC, discussed Cannabis Hyperemesis. Last use at beginning of pregnancy per patient. Patient declines  Haldol for Cannabis Hyperemesis. Patient declines vaginal swabs. Also declines Scopolamine patch, Compazine.Reviewed rx from 10/12 and confirmed patient has refills available for Phenergan. Discussed using as suppository if unable to tolerate PO  --Encouraged patient to refine diet, bland solids and sips of room temperature liquids. Avoid strong flavors, large volumes of liquid.  Patient Vitals for the past 24 hrs:  BP Temp Temp src Pulse Resp SpO2 Weight  04/23/20 1430 115/63 98.6 F (37 C) -- 76 -- -- --  04/23/20 0922 117/69 98.6 F (37 C) Oral 68 16 98 % --  04/23/20 0917 -- -- -- -- -- -- 58 kg   Results for orders placed or performed during the hospital encounter of 04/23/20 (from the past 24 hour(s))  Urinalysis, Routine w reflex microscopic Urine, Clean Catch     Status: Abnormal   Collection Time: 04/23/20  9:09 AM  Result Value Ref Range   Color, Urine YELLOW YELLOW   APPearance HAZY (A) CLEAR   Specific Gravity, Urine 1.024 1.005 - 1.030   pH 5.0 5.0 - 8.0   Glucose, UA NEGATIVE NEGATIVE mg/dL   Hgb urine dipstick SMALL (A) NEGATIVE   Bilirubin Urine NEGATIVE NEGATIVE   Ketones, ur 80 (A) NEGATIVE mg/dL   Protein, ur NEGATIVE NEGATIVE mg/dL   Nitrite NEGATIVE NEGATIVE   Leukocytes,Ua NEGATIVE NEGATIVE   RBC / HPF 0-5 0 - 5 RBC/hpf   WBC, UA 0-5 0 - 5 WBC/hpf   Bacteria, UA RARE (A) NONE SEEN   Squamous Epithelial / LPF 6-10 0 - 5   Mucus PRESENT   Rapid urine drug screen (hospital performed)     Status: Abnormal   Collection Time: 04/23/20  9:09 AM  Result Value Ref Range   Opiates NONE DETECTED NONE DETECTED   Cocaine NONE DETECTED NONE DETECTED   Benzodiazepines NONE DETECTED NONE DETECTED   Amphetamines NONE DETECTED NONE DETECTED   Tetrahydrocannabinol POSITIVE (A) NONE DETECTED   Barbiturates NONE DETECTED NONE DETECTED   Meds ordered this encounter  Medications  . lactated ringers bolus 1,000 mL  . promethazine (PHENERGAN) injection 25 mg  . famotidine  (PEPCID) IVPB 20 mg premix  . multivitamins adult (INFUVITE ADULT) 10 mL in lactated ringers 1,000 mL infusion   Assessment and Plan  --34 y.o. [id number] at [redacted]w[redacted]d  --IUP confirmed 04/13/2020 --+ THC, concern for Cannabis Hyperemesis --Tolerating PO prior to discharge --Discharge home in stable condition  F/U: --New OB Intake Interview [facility] 05/11/2020  Calvert Cantor, CNM 04/23/2020, 3:22 PM

## 2020-04-23 NOTE — MAU Note (Signed)
Patient presents to MAU with lower abdominal pain and nausea/vomiting.  Reports that pain started yesterday around 1800 prior to her leaving for work.  Pain is 6/10.  Took 2 XST Tylenol with no relief.  Pt has been having n/v throughout pregnancy and states the medicine that was prescribed is not helping.  Last took meds for nausea around 1900 last night.  Denies vaginal bleeding or discharge.

## 2020-05-10 DIAGNOSIS — Z3480 Encounter for supervision of other normal pregnancy, unspecified trimester: Secondary | ICD-10-CM | POA: Insufficient documentation

## 2020-05-11 ENCOUNTER — Ambulatory Visit: Payer: Medicaid Other

## 2020-05-11 DIAGNOSIS — Z3480 Encounter for supervision of other normal pregnancy, unspecified trimester: Secondary | ICD-10-CM

## 2020-05-11 MED ORDER — BLOOD PRESSURE KIT DEVI: 1.0000 | 0 refills | Status: DC

## 2020-05-11 NOTE — Progress Notes (Signed)
Telephone call to patient, she stated that she is canceling the appt as she is no longer pregnant. She then hanged the phone up.

## 2020-05-18 ENCOUNTER — Encounter: Payer: Medicaid Other | Admitting: Advanced Practice Midwife

## 2020-05-28 ENCOUNTER — Other Ambulatory Visit: Payer: Self-pay

## 2020-05-28 ENCOUNTER — Emergency Department (HOSPITAL_COMMUNITY)
Admission: EM | Admit: 2020-05-28 | Discharge: 2020-05-28 | Disposition: A | Discharge status: Home / Self Care | Payer: Medicaid Other | Attending: Emergency Medicine | Admitting: Emergency Medicine

## 2020-05-28 ENCOUNTER — Encounter (HOSPITAL_COMMUNITY): Payer: Self-pay | Admitting: Emergency Medicine

## 2020-05-28 DIAGNOSIS — Z87891 Personal history of nicotine dependence: Secondary | ICD-10-CM | POA: Insufficient documentation

## 2020-05-28 DIAGNOSIS — Y9301 Activity, walking, marching and hiking: Secondary | ICD-10-CM | POA: Diagnosis not present

## 2020-05-28 DIAGNOSIS — W108XXA Fall (on) (from) other stairs and steps, initial encounter: Secondary | ICD-10-CM | POA: Insufficient documentation

## 2020-05-28 DIAGNOSIS — M25551 Pain in right hip: Secondary | ICD-10-CM | POA: Insufficient documentation

## 2020-05-28 DIAGNOSIS — W19XXXA Unspecified fall, initial encounter: Secondary | ICD-10-CM

## 2020-05-28 MED ORDER — IBUPROFEN 200 MG PO TABS
400.0000 mg | ORAL_TABLET | Freq: Once | ORAL | Status: AC
  Administered 2020-05-28: 400 mg via ORAL
  Filled 2020-05-28: qty 2

## 2020-05-28 NOTE — ED Triage Notes (Signed)
Patient states that yesterday she was going down the steps and when she stepped off the bottom, states her knee gave out and she landed on her hip. Complaining of hip pain, patient is ambulatory.

## 2020-05-28 NOTE — ED Provider Notes (Signed)
[facility]-EMERGENCY DEPT Provider Note   CSN: [id number] Arrival date & time: 05/28/20  0359     History Chief Complaint  Patient presents with  . Hip Pain  . Fall    Miranda Morrow is a 34 y.o. female.  The history is provided by the patient.  Hip Pain This is a new problem. The current episode started 3 to 5 hours ago. The problem occurs constantly. The problem has been gradually worsening. Pertinent negatives include no chest pain, no abdominal pain, no headaches and no shortness of breath. The symptoms are aggravated by walking. The symptoms are relieved by rest. She has tried nothing for the symptoms.  Fall This is a new problem. The current episode started 3 to 5 hours ago. The problem occurs constantly. The problem has been gradually worsening. Pertinent negatives include no chest pain, no abdominal pain, no headaches and no shortness of breath. The symptoms are aggravated by walking. The symptoms are relieved by rest.   Patient reports she fell down one step approximately 3 hours ago.  Reports her knee gave out and she landed on her right buttock.  No head injury or LOC.  No headache or neck pain.  No chest or abdominal pain.  Reports most of her pain is in her right hip and buttock.  She is able to ambulate.  Reports she is no longer pregnant    Past Medical History:  Diagnosis Date  . Abnormal Pap smear of cervix 2006   underwent colposcopy followed by   . Functional ovarian cysts     Patient Active Problem List   Diagnosis Date Noted  . Supervision of other normal pregnancy, antepartum 05/10/2020  . Nausea and vomiting in pregnancy 04/13/2020  . Spotting in early pregnancy 04/13/2020  . Bacterial vaginosis 04/13/2020  . Intrauterine pregnancy 04/13/2020    Past Surgical History:  Procedure Laterality Date  . COLPOSCOPY  2006  . CRYOTHERAPY  2006     OB History    Gravida  8   Para  4   Term  4   Preterm  0   AB  2    Living  4     SAB  2   TAB  0   Ectopic  0   Multiple  0   Live Births              Family History  Problem Relation Age of Onset  . Hypertension Mother   . Heart disease Mother        also has pacemaker/AICD  . Congestive Heart Failure Mother     Social History   Tobacco Use  . Smoking status: Former Smoker    Years: 3.00    Types: Cigarettes  . Smokeless tobacco: Never Used  Substance Use Topics  . Alcohol use: Not Currently    Alcohol/week: 0.0 standard drinks    Comment: occ  . Drug use: No    Home Medications Prior to Admission medications   Medication Sig Start Date End Date Taking? Authorizing Provider  promethazine (PHENERGAN) 25 MG tablet Take 1 tablet (25 mg total) by mouth every 6 (six) hours as needed for nausea or vomiting. Patient not taking: Reported on 05/28/2020 04/13/20 05/28/20  Raelyn Mora, CNM    Allergies    Patient has no known allergies.  Review of Systems   Review of Systems  Constitutional: Negative for fever.  Respiratory: Negative for shortness of breath.   Cardiovascular: Negative  for chest pain.  Gastrointestinal: Negative for abdominal pain.  Musculoskeletal: Positive for arthralgias and myalgias.  Neurological: Negative for headaches.  All other systems reviewed and are negative.   Physical Exam Updated Vital Signs BP 121/65 (BP Location: Left Arm)   Pulse 67   Temp 98 F (36.7 C) (Oral)   Resp 18   Ht 1.499 m (4\' 11" )   Wt 60.8 kg   LMP 09/21/2019   SpO2 99%   Breastfeeding Unknown   BMI 27.06 kg/m   Physical Exam CONSTITUTIONAL: Well developed/well nourished HEAD: Normocephalic/atraumatic EYES: EOMI/PERRL ENMT: Mucous membranes moist NECK: supple no meningeal signs SPINE/BACK:entire spine nontender, No bruising/crepitance/stepoffs noted to spine CV: S1/S2 noted, no murmurs/rubs/gallops noted LUNGS: Lungs are clear to auscultation bilaterally, no apparent distress ABDOMEN: soft, nontender    NEURO: Pt is awake/alert/appropriate, moves all extremitiesx4.  No facial droop.   EXTREMITIES: pulses normal/equal, full ROM of both hips, All other extremities/joints palpated/ranged and nontender SKIN: warm, color normal, no bruising noted to right buttocks, female chaperone present for exam PSYCH: no abnormalities of mood noted, alert and oriented to situation  ED Results / Procedures / Treatments   Labs (all labs ordered are listed, but only abnormal results are displayed) Labs Reviewed - No data to display  EKG None  Radiology No results found.  Procedures  Procedures   Medications Ordered in ED Medications  ibuprofen (ADVIL) tablet 400 mg (400 mg Oral Given 05/28/20 0443)    ED Course  I have reviewed the triage vital signs and the nursing notes.    MDM Rules/Calculators/A&P                          Patient presents after a mechanical fall approximately 3 hours ago.  Patient is able to stand and ambulate.  She has full range of motion of both hips.  No signs of any other traumatic injury.  No indication for imaging at this time.  Advised NSAIDs, heat and continue weight-bear as tolerated.  Work note is been provided. Patient agrees with plan Final Clinical Impression(s) / ED Diagnoses Final diagnoses:  Fall, initial encounter  Right hip pain    Rx / DC Orders ED Discharge Orders    None       07-29-1968, MD 05/28/20 [phone]

## 2020-12-02 ENCOUNTER — Emergency Department (HOSPITAL_COMMUNITY): Payer: Medicaid Other

## 2020-12-02 ENCOUNTER — Emergency Department (HOSPITAL_COMMUNITY)
Admission: EM | Admit: 2020-12-02 | Discharge: 2020-12-02 | Disposition: A | Discharge status: Home / Self Care | Payer: Medicaid Other | Attending: Emergency Medicine | Admitting: Emergency Medicine

## 2020-12-02 ENCOUNTER — Other Ambulatory Visit: Payer: Self-pay

## 2020-12-02 DIAGNOSIS — W208XXA Other cause of strike by thrown, projected or falling object, initial encounter: Secondary | ICD-10-CM | POA: Insufficient documentation

## 2020-12-02 DIAGNOSIS — M79671 Pain in right foot: Secondary | ICD-10-CM | POA: Insufficient documentation

## 2020-12-02 DIAGNOSIS — Z5321 Procedure and treatment not carried out due to patient leaving prior to being seen by health care provider: Secondary | ICD-10-CM | POA: Insufficient documentation

## 2020-12-02 NOTE — ED Provider Notes (Signed)
Emergency Medicine Provider Triage Evaluation Note  Miranda Morrow , a 35 y.o. female  was evaluated in triage.  Pt complains of right foot pain.  She states she dropped a bookshelf on her foot.  Reports pain with movement and palpation.  Review of Systems  Positive: Foot pain, gait problem Negative: Open wounds  Physical Exam  LMP 09/21/2019  Gen:   Awake, no distress   Resp:  Normal effort  MSK:   Moves extremities some difficulty  Other:    Medical Decision Making  Medically screening exam initiated at 12:21 AM.  Appropriate orders placed.  CASSIDIE VEIGA was informed that the remainder of the evaluation will be completed by another provider, this initial triage assessment does not replace that evaluation, and the importance of remaining in the ED until their evaluation is complete.     Roxy Horseman, PA-C 12/02/20 Glena Norfolk    Geoffery Lyons, MD 12/02/20 [phone]

## 2020-12-02 NOTE — ED Notes (Signed)
Patient left on own accord °

## 2020-12-02 NOTE — ED Triage Notes (Signed)
Right foot pain after dropping a bookshelf on foot. "feels like its floating"

## 2021-03-10 ENCOUNTER — Emergency Department (HOSPITAL_COMMUNITY)
Admission: EM | Admit: 2021-03-10 | Discharge: 2021-03-10 | Disposition: A | Discharge status: Home / Self Care | Payer: Medicaid Other | Attending: Emergency Medicine | Admitting: Emergency Medicine

## 2021-03-10 ENCOUNTER — Other Ambulatory Visit: Payer: Self-pay

## 2021-03-10 DIAGNOSIS — M79672 Pain in left foot: Secondary | ICD-10-CM | POA: Diagnosis not present

## 2021-03-10 DIAGNOSIS — Z87891 Personal history of nicotine dependence: Secondary | ICD-10-CM | POA: Diagnosis not present

## 2021-03-10 DIAGNOSIS — Z859 Personal history of malignant neoplasm, unspecified: Secondary | ICD-10-CM | POA: Insufficient documentation

## 2021-03-10 DIAGNOSIS — M79605 Pain in left leg: Secondary | ICD-10-CM | POA: Insufficient documentation

## 2021-03-10 MED ORDER — NAPROXEN 500 MG PO TABS: 500.0000 mg | ORAL_TABLET | Freq: Two times a day (BID) | ORAL | 0 refills | Status: DC

## 2021-03-10 NOTE — ED Provider Notes (Signed)
[facility] EMERGENCY DEPARTMENT Provider Note   CSN: [id number] Arrival date & time: 03/10/21  2236     History Chief Complaint  Patient presents with   Foot Pain    Miranda Morrow is a 35 y.o. female presenting for evaluation of left foot and calf pain.  Patient states pain has been present for about a week.  It is intermittent, more likely to happen when she is walking.  When she is sitting still with her legs dangling, she states it feels numb.  Denies fall, trauma, or injury.  No symptoms on the right side.  No chest pain or shortness of breath.  She notes recent travel, surgeries, location, history of cancer, history previous DVT/PE, or hormone use.  She takes no medications daily.  She has not taking anything for her symptoms.  HPI     Past Medical History:  Diagnosis Date   Abnormal Pap smear of cervix 2006   underwent colposcopy followed by    Functional ovarian cysts     Patient Active Problem List   Diagnosis Date Noted   Supervision of other normal pregnancy, antepartum 05/10/2020   Nausea and vomiting in pregnancy 04/13/2020   Spotting in early pregnancy 04/13/2020   Bacterial vaginosis 04/13/2020   Intrauterine pregnancy 04/13/2020    Past Surgical History:  Procedure Laterality Date   COLPOSCOPY  2006   CRYOTHERAPY  2006     OB History     Gravida  8   Para  4   Term  4   Preterm  0   AB  2   Living  4      SAB  2   IAB  0   Ectopic  0   Multiple  0   Live Births              Family History  Problem Relation Age of Onset   Hypertension Mother    Heart disease Mother        also has pacemaker/AICD   Congestive Heart Failure Mother     Social History   Tobacco Use   Smoking status: Former    Years: 3.00    Types: Cigarettes   Smokeless tobacco: Never  Substance Use Topics   Alcohol use: Not Currently    Alcohol/week: 0.0 standard drinks    Comment: occ   Drug use: No    Home  Medications Prior to Admission medications   Medication Sig Start Date End Date Taking? Authorizing Provider  naproxen (NAPROSYN) 500 MG tablet Take 1 tablet (500 mg total) by mouth 2 (two) times daily with a meal. 03/10/21  Yes Ahnna Dungan, PA-C  promethazine (PHENERGAN) 25 MG tablet Take 1 tablet (25 mg total) by mouth every 6 (six) hours as needed for nausea or vomiting. Patient not taking: Reported on 05/28/2020 04/13/20 05/28/20  Raelyn Mora, CNM    Allergies    Patient has no known allergies.  Review of Systems   Review of Systems  Musculoskeletal:  Positive for myalgias.  All other systems reviewed and are negative.  Physical Exam Updated Vital Signs BP 107/79 (BP Location: Left Arm)   Pulse 65   Temp 98.3 F (36.8 C)   Resp 13   SpO2 99%   Physical Exam Vitals and nursing note reviewed.  Constitutional:      General: She is not in acute distress.    Appearance: She is well-developed.  HENT:     Head: Normocephalic  and atraumatic.  Eyes:     Extraocular Movements: Extraocular movements intact.  Cardiovascular:     Rate and Rhythm: Normal rate and regular rhythm.  Pulmonary:     Effort: Pulmonary effort is normal.     Breath sounds: Normal breath sounds.  Abdominal:     General: There is no distension.  Musculoskeletal:        General: Tenderness present. Normal range of motion.     Cervical back: Normal range of motion.     Comments: Ttp of the L foot starting on the plant surface at the arch. Mild ttp of the L calf. Negative homans.  No erythema or warmth.  2+ pedal pulses bilaterally.  No edema.  Skin:    General: Skin is warm.     Capillary Refill: Capillary refill takes less than 2 seconds.     Findings: No rash.  Neurological:     Mental Status: She is alert and oriented to person, place, and time.    ED Results / Procedures / Treatments   Labs (all labs ordered are listed, but only abnormal results are displayed) Labs Reviewed - No data to  display  EKG None  Radiology No results found.  Procedures Procedures   Medications Ordered in ED Medications - No data to display  ED Course  I have reviewed the triage vital signs and the nursing notes.  Pertinent labs & imaging results that were available during my care of the patient were reviewed by me and considered in my medical decision making (see chart for details).    MDM Rules/Calculators/A&P                           Patient presenting for evaluation of left foot and calf pain.  On exam, patient appears nontoxic.  She is neurovascular intact.  Pain is reproducible with palpation of the plantar surface of the foot and extends up the calf.  Most consistent with an inflammatory process such as plantar fasciitis.  However in the setting of unilateral calf pain, also to consider DVT, although no risk factors.  Ultrasound is not available at this time.  We will have patient present tomorrow for DVT ultrasound.  However discussed symptomatic management for inflammatory process and follow-up with podiatry if ultrasound is negative.  At this time, patient appears safe for discharge.  Return precautions given.  Patient states she understands and agrees to plan.   Final Clinical Impression(s) / ED Diagnoses Final diagnoses:  Left foot pain  Left leg pain    Rx / DC Orders ED Discharge Orders          Ordered    naproxen (NAPROSYN) 500 MG tablet  2 times daily with meals        03/10/21 2310    LE VENOUS        03/10/21 2310             Dann Galicia, PA-C 03/10/21 2329    Gerhard Munch, MD 03/10/21 2337

## 2021-03-10 NOTE — Discharge Instructions (Signed)
Follow the instructions to return tomorrow for the ultrasound to rule out a DVT. If negative, treat as disucssed.  Take naproxen 2 times a day with meals.  Do not take other anti-inflammatories at the same time (Advil, Motrin, ibuprofen, Aleve). You may supplement with Tylenol if you need further pain control. Use ice packs 3 times a day for pain. I recommend you freeze a water bottle and roll it under your foot.  Use foot inserts/orthotics, especially if you are wearing shoes without arch support.  Follow up with the foot doctor listed below if not improving.  Return to the ER with any new, worsening, or concerning symptoms.

## 2021-03-10 NOTE — ED Triage Notes (Signed)
Pt c/o intermittent sharp pain & numbness up the middle of L foot through calf x "a couple days." No OTC medication, states nothing makes it better/worse, no known injury.   7-10/10 "fiery, burning" pain

## 2021-03-11 ENCOUNTER — Other Ambulatory Visit: Payer: Self-pay

## 2021-03-11 ENCOUNTER — Ambulatory Visit (HOSPITAL_COMMUNITY)
Admission: RE | Admit: 2021-03-11 | Discharge: 2021-03-11 | Disposition: A | Discharge status: Home / Self Care | Payer: Medicaid Other | Source: Ambulatory Visit | Attending: Student | Admitting: Student

## 2021-03-11 DIAGNOSIS — M79605 Pain in left leg: Secondary | ICD-10-CM | POA: Diagnosis not present

## 2021-03-11 DIAGNOSIS — M79662 Pain in left lower leg: Secondary | ICD-10-CM | POA: Insufficient documentation

## 2021-03-11 NOTE — Progress Notes (Signed)
Lower extremity venous has been completed.   Preliminary results in CV Proc.   Miranda Morrow 03/11/2021 11:25 AM

## 2021-03-22 ENCOUNTER — Ambulatory Visit: Payer: Medicaid Other | Admitting: Podiatry

## 2021-03-30 ENCOUNTER — Ambulatory Visit: Payer: Medicaid Other | Admitting: Podiatrist

## 2021-11-11 ENCOUNTER — Other Ambulatory Visit: Payer: Self-pay

## 2021-11-11 ENCOUNTER — Emergency Department (HOSPITAL_COMMUNITY)
Admission: EM | Admit: 2021-11-11 | Discharge: 2021-11-12 | Disposition: A | Discharge status: Home / Self Care | Payer: Medicaid Other | Attending: Emergency Medicine | Admitting: Emergency Medicine

## 2021-11-11 ENCOUNTER — Encounter (HOSPITAL_COMMUNITY): Payer: Self-pay

## 2021-11-11 DIAGNOSIS — H9202 Otalgia, left ear: Secondary | ICD-10-CM | POA: Insufficient documentation

## 2021-11-11 NOTE — ED Triage Notes (Signed)
Pt reports with left ear pain since Friday morning that is now causing her face to hurt.  ?

## 2021-11-12 MED ORDER — NAPROXEN 500 MG PO TABS
500.0000 mg | ORAL_TABLET | Freq: Once | ORAL | Status: AC
  Administered 2021-11-12: 500 mg via ORAL
  Filled 2021-11-12: qty 1

## 2021-11-12 MED ORDER — DOXYCYCLINE HYCLATE 100 MG PO CAPS: 100.0000 mg | ORAL_CAPSULE | Freq: Two times a day (BID) | ORAL | 0 refills | Status: DC

## 2021-11-12 MED ORDER — NAPROXEN 500 MG PO TABS: 500.0000 mg | ORAL_TABLET | Freq: Two times a day (BID) | ORAL | 0 refills | Status: DC | PRN

## 2021-11-12 MED ORDER — DOXYCYCLINE HYCLATE 100 MG PO TABS
100.0000 mg | ORAL_TABLET | Freq: Once | ORAL | Status: AC
  Administered 2021-11-12: 100 mg via ORAL
  Filled 2021-11-12: qty 1

## 2021-11-12 NOTE — Discharge Instructions (Addendum)
You were seen in the ER today for ear pain.  ?Please take doxycycline which is an antibiotic to treat for possible infection of the skin of the ear.  We are also sending you with naproxen to take as needed for pain.  Please take this with food as it can cause stomach upset and or stomach bleeding.  Do not take other NSAIDs with this such as Motrin, Advil, Aleve, Mobic, Advil, Goody powder etc. as these are similar.  You may take Tylenol per over-the-counter dosing. ? ?We have prescribed you new medication(s) today. Discuss the medications prescribed today with your pharmacist as they can have adverse effects and interactions with your other medicines including over the counter and prescribed medications. Seek medical evaluation if you start to experience new or abnormal symptoms after taking one of these medicines, seek care immediately if you start to experience difficulty breathing, feeling of your throat closing, facial swelling, or rash as these could be indications of a more serious allergic reaction ? ? ?Follow-up with your primary care provider and/or your nose and throat specialist for recheck of this area.  Return to the emergency department for new or worsening symptoms including but limited to new or worsening pain, fever, spreading redness, pain/swelling behind the ear, or any other concerns. ? ?

## 2021-11-12 NOTE — ED Provider Notes (Signed)
?[facility] DEPT ?Provider Note ? ? ?CSN: [id number] ?Arrival date & time: 11/11/21  2350 ? ?  ? ?History ? ?Chief Complaint  ?Patient presents with  ? Otalgia  ? Facial Pain  ? ? ?Miranda Morrow is a 36 y.o. female  without significant pmhx who presents to the ED with complaints of left ear pain that began this morning.  Patient states the pain is to the external ear, it is constant, mildly alleviated by Tylenol earlier, no alleviating factors.  She is concerned it is red in that area.  Denies injury/trauma to the area.  She denies fever, ear drainage, hearing loss, or neck stiffness. ? ?Left ear ? ?HPI ? ?  ? ?Home Medications ?Prior to Admission medications   ?Medication Sig Start Date End Date Taking? Authorizing Provider  ?medroxyPROGESTERone (DEPO-PROVERA) 150 MG/ML injection Inject into the muscle. 10/29/20   [provider]  ?naproxen (NAPROSYN) 500 MG tablet Take 1 tablet (500 mg total) by mouth 2 (two) times daily with a meal. 03/10/21   Caccavale, Sophia, PA-C  ?promethazine (PHENERGAN) 25 MG tablet Take 1 tablet (25 mg total) by mouth every 6 (six) hours as needed for nausea or vomiting. ?Patient not taking: Reported on 05/28/2020 04/13/20 05/28/20  Laury Deep, CNM  ?   ? ?Allergies    ?Patient has no known allergies.   ? ?Review of Systems   ?Review of Systems  ?Constitutional:  Negative for chills and fever.  ?HENT:  Positive for ear pain. Negative for ear discharge, facial swelling and hearing loss.   ?Respiratory:  Negative for cough and shortness of breath.   ?Cardiovascular:  Negative for chest pain.  ?Gastrointestinal:  Negative for abdominal pain.  ?Musculoskeletal:  Negative for neck stiffness.  ?All other systems reviewed and are negative. ? ?Physical Exam ?Updated Vital Signs ?BP (!) 121/104 (BP Location: Left Arm)   Pulse 75   Temp 98.2 ?F (36.8 ?C) (Oral)   Resp 18   SpO2 98%  ?Physical Exam ?Vitals and nursing note reviewed.  ?Constitutional:    ?   General: She is not in acute distress. ?   Appearance: She is well-developed.  ?HENT:  ?   Head: Normocephalic and atraumatic.  ?   Right Ear: No drainage. Tympanic membrane is not perforated, erythematous, retracted or bulging.  ?   Left Ear: No drainage. Tympanic membrane is not perforated, erythematous, retracted or bulging.  ?   Ears:  ? ?   Comments: Tragus piercing present bilaterally- no surrounding erythema/warmth/drainage. No erythema/swelling to the auricle/pinna.  ?   Nose: Nose normal.  ?   Mouth/Throat:  ?   Pharynx: Oropharynx is clear. Uvula midline.  ?   Comments: Posterior oropharynx is symmetric appearing. Patient tolerating own secretions without difficulty. No trismus. No drooling. No hot potato voice. No swelling beneath the tongue, submandibular compartment is soft.  ?Eyes:  ?   General:     ?   Right eye: No discharge.     ?   Left eye: No discharge.  ?   Conjunctiva/sclera: Conjunctivae normal.  ?Cardiovascular:  ?   Rate and Rhythm: Normal rate and regular rhythm.  ?Pulmonary:  ?   Effort: No respiratory distress.  ?Neurological:  ?   Mental Status: She is alert.  ?   Comments: Clear speech.  No facial droop.  ?Psychiatric:     ?   Behavior: Behavior normal.     ?   Thought Content: Thought content  normal.  ? ? ?ED Results / Procedures / Treatments   ?Labs ?(all labs ordered are listed, but only abnormal results are displayed) ?Labs Reviewed - No data to display ? ?EKG ?None ? ?Radiology ?No results found. ? ?Procedures ?Procedures  ? ? ?Medications Ordered in ED ?Medications  ?doxycycline (VIBRA-TABS) tablet 100 mg (has no administration in time range)  ?naproxen (NAPROSYN) tablet 500 mg (has no administration in time range)  ? ? ?ED Course/ Medical Decision Making/ A&P ?  ?                        ?Medical Decision Making ? ?Patient presents to the emergency department with complaints of ear pain that began this morning.  She is nontoxic and resting comfortably, vitals with mildly  elevated blood pressure, doubt hypertensive emergency. ? ?Chart reviewed for additional history, external records reviewed, last creatinine within normal limits. ? ?No findings to suggest mastoiditis or meningitis.  Clinically no findings of acute otitis media or TM perforation.  The EAC is fairly unremarkable with the exception of the very external aspect of it just posterior to the tragus, there is some erythema/swelling to this area, however no obvious fluctuance.  Somewhat suspicious for cellulitis.  Given not to the auricle/pinna this does not seem consistent with perichondritis.  There are no vesicular lesions to suggest HSV/Ramsay Hunt.  Will treat with doxycycline and NSAIDs with PCP/ENT follow-up.  I discussed treatment plan, need for follow-up, return cautions with the patient.  Provided opportunity for questions, she has confirmed understanding and is in agreement with plan. ? ? ?Final Clinical Impression(s) / ED Diagnoses ?Final diagnoses:  ?Left ear pain  ? ? ?Rx / DC Orders ?ED Discharge Orders   ? ?      Ordered  ?  doxycycline (VIBRAMYCIN) 100 MG capsule  2 times daily       ? 11/12/21 0047  ?  naproxen (NAPROSYN) 500 MG tablet  2 times daily PRN       ? 11/12/21 0047  ? ?  ?  ? ?  ? ? ?  ?Amaryllis Dyke, PA-C ?11/12/21 [id number] ? ?  ?Deno Etienne, DO ?11/12/21 0209 ? ?

## 2022-02-23 ENCOUNTER — Emergency Department (HOSPITAL_COMMUNITY)
Admission: EM | Admit: 2022-02-23 | Discharge: 2022-02-24 | Disposition: A | Discharge status: Home / Self Care | Payer: Medicaid Other | Attending: Emergency Medicine | Admitting: Emergency Medicine

## 2022-02-23 DIAGNOSIS — H60331 Swimmer's ear, right ear: Secondary | ICD-10-CM | POA: Diagnosis not present

## 2022-02-23 DIAGNOSIS — H9201 Otalgia, right ear: Secondary | ICD-10-CM | POA: Diagnosis present

## 2022-02-24 ENCOUNTER — Encounter (HOSPITAL_COMMUNITY): Payer: Self-pay

## 2022-02-24 ENCOUNTER — Other Ambulatory Visit: Payer: Self-pay

## 2022-02-24 MED ORDER — OFLOXACIN 0.3 % OT SOLN: 5.0000 [drp] | Freq: Two times a day (BID) | OTIC | 0 refills | Status: AC

## 2022-02-24 NOTE — ED Provider Notes (Signed)
[facility]-EMERGENCY DEPT Provider Note   CSN: [id number] Arrival date & time: 02/23/22  2356     History  Chief Complaint  Patient presents with   Ear Pain    Miranda Morrow is a 36 y.o. female who presents with 24 hours of right ear irritation without changes in hearing.  Has bending in her ear with Q-tips, pens, and other foreign objects to try to soothe the itch and discomfort.  Has been swimming recently.  Denies any discharge from the ear.  I personally reviewed her medical records She does not carry medical diagnoses nor standing medications daily.  HPI     Home Medications Prior to Admission medications   Medication Sig Start Date End Date Taking? Authorizing Provider  Acetaminophen (TYLENOL 8 HOUR PO) Take 1 tablet by mouth every 8 (eight) hours as needed (pain).   Yes [provider]  ofloxacin (FLOXIN) 0.3 % OTIC solution Place 5 drops into the right ear 2 (two) times daily for 7 days. 02/24/22 03/03/22 Yes Miranda Hashimi, Lupe Carney R, PA-C  doxycycline (VIBRAMYCIN) 100 MG capsule Take 1 capsule (100 mg total) by mouth 2 (two) times daily. Patient not taking: Reported on 02/24/2022 11/12/21   Petrucelli, Lelon Mast R, PA-C  naproxen (NAPROSYN) 500 MG tablet Take 1 tablet (500 mg total) by mouth 2 (two) times daily as needed for moderate pain. Patient not taking: Reported on 02/24/2022 11/12/21   Petrucelli, Pleas Koch, PA-C  promethazine (PHENERGAN) 25 MG tablet Take 1 tablet (25 mg total) by mouth every 6 (six) hours as needed for nausea or vomiting. Patient not taking: Reported on 05/28/2020 04/13/20 05/28/20  Miranda Morrow, CNM      Allergies    Patient has no known allergies.    Review of Systems   Review of Systems  HENT:  Positive for ear pain.     Physical Exam Updated Vital Signs BP 136/75   Pulse 72   Temp 98.1 F (36.7 C) (Oral)   Resp 18   Ht 4\' 11"  (1.499 m)   Wt 65.3 kg   SpO2 100%   BMI 29.08 kg/m  Physical  Exam Vitals and nursing note reviewed.  Constitutional:      Appearance: She is not ill-appearing or toxic-appearing.  HENT:     Head: Normocephalic and atraumatic.     Right Ear: Tympanic membrane and external ear normal. Tympanic membrane is not perforated or erythematous.     Left Ear: Tympanic membrane, ear canal and external ear normal. Tympanic membrane is not perforated or erythematous.     Ears:     Comments: Erythematous, edematous EAC with scant watery discharge on the Right.  Eyes:     General: No scleral icterus.       Right eye: No discharge.        Left eye: No discharge.     Conjunctiva/sclera: Conjunctivae normal.  Pulmonary:     Effort: Pulmonary effort is normal.  Skin:    General: Skin is warm and dry.  Neurological:     General: No focal deficit present.     Mental Status: She is alert.  Psychiatric:        Mood and Affect: Mood normal.     ED Results / Procedures / Treatments   Labs (all labs ordered are listed, but only abnormal results are displayed) Labs Reviewed - No data to display  EKG None  Radiology No results found.  Procedures Procedures    Medications Ordered  in ED Medications - No data to display  ED Course/ Medical Decision Making/ A&P                           Medical Decision Making 36 year old female who presents with concern for right ear pain x 24 hours.   VS normal on intake, HEENT exam as above with clinical findings consistent with otitis externa on the right, no mastoid TTP.   Risk Prescription drug management.   Will discharge with Rx for ear drops, recommend outpatient followup. No further workup warranted in the ED.   [name]  voiced understanding of her medical evaluation and treatment plan. Each of their questions answered to their expressed satisfaction.  Return precautions were given.  Patient is well-appearing, stable, and was discharged in good condition..  This chart was dictated using voice recognition  software, Dragon. Despite the best efforts of this provider to proofread and correct errors, errors may still occur which can change documentation meaning.   Final Clinical Impression(s) / ED Diagnoses Final diagnoses:  Acute swimmer's ear of right side    Rx / DC Orders ED Discharge Orders          Ordered    ofloxacin (FLOXIN) 0.3 % OTIC solution  2 times daily        02/24/22 0451              Miranda Morrow, Miranda Gavia, PA-C 02/24/22 0457    Mesner, Barbara Cower, MD 02/24/22 0500

## 2022-02-24 NOTE — Discharge Instructions (Signed)
Please use the prescribed eardrops for the entire course for your ear infection.  You may follow-up with your primary care doctor return here with any severe symptoms.

## 2022-02-24 NOTE — ED Triage Notes (Signed)
Right ear pain since this morning.

## 2022-04-05 ENCOUNTER — Emergency Department (HOSPITAL_COMMUNITY): Payer: Medicaid Other

## 2022-04-05 ENCOUNTER — Emergency Department (HOSPITAL_COMMUNITY)
Admission: EM | Admit: 2022-04-05 | Discharge: 2022-04-05 | Disposition: A | Discharge status: Home / Self Care | Payer: Medicaid Other | Attending: Emergency Medicine | Admitting: Emergency Medicine

## 2022-04-05 ENCOUNTER — Other Ambulatory Visit: Payer: Self-pay

## 2022-04-05 ENCOUNTER — Encounter (HOSPITAL_COMMUNITY): Payer: Self-pay | Admitting: *Deleted

## 2022-04-05 DIAGNOSIS — M79622 Pain in left upper arm: Secondary | ICD-10-CM | POA: Insufficient documentation

## 2022-04-05 DIAGNOSIS — Y92410 Unspecified street and highway as the place of occurrence of the external cause: Secondary | ICD-10-CM | POA: Diagnosis not present

## 2022-04-05 DIAGNOSIS — M546 Pain in thoracic spine: Secondary | ICD-10-CM | POA: Diagnosis not present

## 2022-04-05 DIAGNOSIS — M79621 Pain in right upper arm: Secondary | ICD-10-CM | POA: Diagnosis not present

## 2022-04-05 DIAGNOSIS — M545 Low back pain, unspecified: Secondary | ICD-10-CM | POA: Diagnosis not present

## 2022-04-05 MED ORDER — NAPROXEN 500 MG PO TABS: 500.0000 mg | ORAL_TABLET | Freq: Two times a day (BID) | ORAL | 0 refills | Status: DC

## 2022-04-05 MED ORDER — METHOCARBAMOL 500 MG PO TABS: 500.0000 mg | ORAL_TABLET | Freq: Two times a day (BID) | ORAL | 0 refills | Status: DC

## 2022-04-05 MED ORDER — METHOCARBAMOL 500 MG PO TABS
750.0000 mg | ORAL_TABLET | Freq: Once | ORAL | Status: AC
  Administered 2022-04-05: 750 mg via ORAL
  Filled 2022-04-05: qty 2

## 2022-04-05 MED ORDER — NAPROXEN 500 MG PO TABS
500.0000 mg | ORAL_TABLET | Freq: Once | ORAL | Status: AC
  Administered 2022-04-05: 500 mg via ORAL
  Filled 2022-04-05: qty 1

## 2022-04-05 NOTE — Discharge Instructions (Addendum)
You are provided with a prescription for anti-inflammatories to help with your pain.  Please take 1 tablet twice a day for the next 5 days with food.  The second medication is a muscle relaxer, you may take 1 tablet up to 3 times a day to help with your pain.  Be aware this medication can make you drowsy, do not drink alcohol or drive while taking this medication.
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
## 2022-04-11 ENCOUNTER — Emergency Department (HOSPITAL_COMMUNITY)
Admission: EM | Admit: 2022-04-11 | Discharge: 2022-04-12 | Disposition: A | Discharge status: Home / Self Care | Payer: Medicaid Other | Attending: Emergency Medicine | Admitting: Emergency Medicine

## 2022-04-11 DIAGNOSIS — R519 Headache, unspecified: Secondary | ICD-10-CM | POA: Insufficient documentation

## 2022-04-11 DIAGNOSIS — Y92410 Unspecified street and highway as the place of occurrence of the external cause: Secondary | ICD-10-CM | POA: Insufficient documentation

## 2022-04-11 DIAGNOSIS — M546 Pain in thoracic spine: Secondary | ICD-10-CM | POA: Diagnosis not present

## 2022-04-12 ENCOUNTER — Encounter (HOSPITAL_COMMUNITY): Payer: Self-pay | Admitting: Emergency Medicine

## 2022-04-12 ENCOUNTER — Other Ambulatory Visit: Payer: Self-pay

## 2022-04-12 MED ORDER — LIDOCAINE 5 % EX PTCH: 1.0000 | MEDICATED_PATCH | Freq: Every day | CUTANEOUS | 0 refills | Status: DC | PRN

## 2022-04-12 MED ORDER — LIDOCAINE 5 % EX PTCH
2.0000 | MEDICATED_PATCH | CUTANEOUS | Status: DC
  Administered 2022-04-12: 2 via TRANSDERMAL
  Filled 2022-04-12: qty 2

## 2022-04-12 MED ORDER — NAPROXEN 500 MG PO TABS
500.0000 mg | ORAL_TABLET | Freq: Once | ORAL | Status: AC
  Administered 2022-04-12: 500 mg via ORAL
  Filled 2022-04-12: qty 1

## 2022-04-12 MED ORDER — METHOCARBAMOL 500 MG PO TABS: 500.0000 mg | ORAL_TABLET | Freq: Three times a day (TID) | ORAL | 0 refills | Status: DC | PRN

## 2022-04-12 MED ORDER — NAPROXEN 500 MG PO TABS: 500.0000 mg | ORAL_TABLET | Freq: Two times a day (BID) | ORAL | 0 refills | Status: DC | PRN
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
## 2022-06-12 ENCOUNTER — Emergency Department (HOSPITAL_COMMUNITY): Payer: Medicaid Other

## 2022-06-12 ENCOUNTER — Other Ambulatory Visit: Payer: Self-pay

## 2022-06-12 ENCOUNTER — Emergency Department (HOSPITAL_COMMUNITY)
Admission: EM | Admit: 2022-06-12 | Discharge: 2022-06-13 | Disposition: A | Discharge status: Home / Self Care | Payer: Medicaid Other | Attending: Emergency Medicine | Admitting: Emergency Medicine

## 2022-06-12 DIAGNOSIS — M25562 Pain in left knee: Secondary | ICD-10-CM | POA: Insufficient documentation

## 2022-06-12 DIAGNOSIS — M25561 Pain in right knee: Secondary | ICD-10-CM | POA: Insufficient documentation

## 2022-06-12 DIAGNOSIS — W108XXA Fall (on) (from) other stairs and steps, initial encounter: Secondary | ICD-10-CM | POA: Insufficient documentation

## 2022-06-12 NOTE — ED Triage Notes (Signed)
Pt has been having BL knee pain for last couple of days. Today pt was walking up stairs and knees gave out and she fell onto left knee. Pain to left knee is 6/10.

## 2022-06-13 NOTE — Discharge Instructions (Addendum)
You were seen in the ER today for your knee pain. Your xrays did not ha have any broken bones or dislocations.  Your physical exam is reassuring as well, no apparent fluid in your knees.  Suspect likely overuse injury from your jobs.  You may wear the provided knee sleeves as needed for symptom management and follow-up with the sports medicine provider listed below as needed.  Return to the ER with any new severe symptoms

## 2022-06-13 NOTE — ED Provider Notes (Signed)
[facility] EMERGENCY DEPARTMENT Provider Note   CSN: [id number] Arrival date & time: 06/12/22  2235     History  Chief Complaint  Patient presents with   Knee Pain    Miranda Morrow is a 36 y.o. female who presents with a few days of bilateral knee pain for the last few days.  Left knee pain worsened after a fall today where she fell on the wet stairs directly onto her left knee.  No numbness tingling or weakness in the lower extremities distal to the knees.  Ambulating normally.  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  No recent known injury at work.  I personally read this patient's medical records.  She is not on any medications daily.  HPI     Home Medications Prior to Admission medications   Medication Sig Start Date End Date Taking? Authorizing Provider  Acetaminophen (TYLENOL 8 HOUR PO) Take 1 tablet by mouth every 8 (eight) hours as needed (pain).    [provider]  lidocaine (LIDODERM) 5 % Place 1 patch onto the skin daily as needed. Apply patch to area most significant pain once per day.  Remove and discard patch within 12 hours of application. 04/12/22   Petrucelli, Samantha R, PA-C  methocarbamol (ROBAXIN) 500 MG tablet Take 1 tablet (500 mg total) by mouth every 8 (eight) hours as needed for muscle spasms. 04/12/22   Petrucelli, Samantha R, PA-C  naproxen (NAPROSYN) 500 MG tablet Take 1 tablet (500 mg total) by mouth 2 (two) times daily as needed. 04/12/22   Petrucelli, Pleas Koch, PA-C  promethazine (PHENERGAN) 25 MG tablet Take 1 tablet (25 mg total) by mouth every 6 (six) hours as needed for nausea or vomiting. Patient not taking: Reported on 05/28/2020 04/13/20 05/28/20  Raelyn Mora, CNM      Allergies    Patient has no known allergies.    Review of Systems   Review of Systems  Musculoskeletal:        Knee pain    Physical Exam Updated Vital  Signs BP 129/76 (BP Location: Left Arm)   Pulse (!) 108   Temp 98.5 F (36.9 C)   Resp 16   Ht 4\' 11"  (1.499 m)   Wt 62.1 kg   BMI 27.67 kg/m  Physical Exam Vitals and nursing note reviewed.  HENT:     Head: Normocephalic and atraumatic.  Eyes:     General: No scleral icterus.       Right eye: No discharge.        Left eye: No discharge.     Conjunctiva/sclera: Conjunctivae normal.  Pulmonary:     Effort: Pulmonary effort is normal.  Abdominal:     Palpations: Abdomen is soft.  Musculoskeletal:     Right knee: Normal.     Left knee: No swelling, deformity, effusion, erythema, ecchymosis, lacerations, bony tenderness or crepitus. Normal range of motion. Tenderness present over the patellar tendon. Normal alignment.     Comments: 2+ PT pulses bilaterally, normal sensation in the legs bilaterally  Skin:    General: Skin is warm and dry.  Neurological:     General: No focal deficit present.     Mental Status: She is alert.  Psychiatric:        Mood and Affect: Mood normal.     ED Results / Procedures / Treatments   Labs (all labs ordered are listed,  but only abnormal results are displayed) Labs Reviewed - No data to display  EKG None  Radiology DG Knee 2 Views Left  Result Date: 06/13/2022 CLINICAL DATA:  Recent fall with left knee pain, initial encounter EXAM: LEFT KNEE - 2 VIEW COMPARISON:  None Available. FINDINGS: No evidence of fracture, dislocation, or joint effusion. No evidence of arthropathy or other focal bone abnormality. Soft tissues are unremarkable. IMPRESSION: No acute abnormality noted. Electronically Signed   By: Alcide Clever M.D.   On: 06/13/2022 00:03   DG Knee 2 Views Right  Result Date: 06/13/2022 CLINICAL DATA:  Recent fall with knee pain, initial encounter EXAM: RIGHT KNEE - 2 VIEW COMPARISON:  None Available. FINDINGS: No evidence of fracture, dislocation, or joint effusion. No evidence of arthropathy or other focal bone abnormality. Soft  tissues are unremarkable. IMPRESSION: No acute abnormality noted. Electronically Signed   By: Alcide Clever M.D.   On: 06/13/2022 00:03    Procedures Procedures   Medications Ordered in ED Medications - No data to display  ED Course/ Medical Decision Making/ A&P                           Medical Decision Making 36 year old with bilateral knee pain left greater than right.  Mildly tachycardic intake, vital signs otherwise normal.  Cardiopulmonary exam is unremarkable at time my evaluation, abdominal exam is benign.  Patient is neurovascular tact in bilateral lower extremities and is ambulatory in the ED.  DDx includes known to acute fracture dislocation, strain, ligamentous injury, contusion.   Clinical picture most consistent with acute contusion over the left knee, likely some component of overuse injury of bilateral knees given heavy lifting in her job.  Will provide bilateral knee sleeves, recommend OTC analgesia with Tylenol and ibuprofen as needed and will provide information for sports medicine follow-up as necessary.  No further workup warranted in ED at this time.  Clinical concern for emergent underlying etiology that warrant further ED workup or inpatient management is exceedingly low.  Anne  voiced understanding of her medical evaluation and treatment plan. Each of their questions answered to their expressed satisfaction.  Return precautions were given.  Patient is well-appearing, stable, and was discharged in good condition.  This chart was dictated using voice recognition software, Dragon. Despite the best efforts of this provider to proofread and correct errors, errors may still occur which can change documentation meaning.     Final Clinical Impression(s) / ED Diagnoses Final diagnoses:  Acute pain of both knees    Rx / DC Orders ED Discharge Orders     None         Paris Lore, PA-C 06/13/22 0112    Sloan Leiter, DO 06/14/22 [phone]

## 2022-06-22 DIAGNOSIS — Z87891 Personal history of nicotine dependence: Secondary | ICD-10-CM | POA: Diagnosis not present

## 2022-06-22 DIAGNOSIS — R42 Dizziness and giddiness: Secondary | ICD-10-CM | POA: Insufficient documentation

## 2022-06-23 ENCOUNTER — Encounter (HOSPITAL_COMMUNITY): Payer: Self-pay

## 2022-06-23 ENCOUNTER — Emergency Department (HOSPITAL_COMMUNITY)
Admission: EM | Admit: 2022-06-23 | Discharge: 2022-06-23 | Disposition: A | Discharge status: Home / Self Care | Payer: Medicaid Other | Attending: Emergency Medicine | Admitting: Emergency Medicine

## 2022-06-23 ENCOUNTER — Other Ambulatory Visit: Payer: Self-pay

## 2022-06-23 DIAGNOSIS — R42 Dizziness and giddiness: Secondary | ICD-10-CM

## 2022-06-23 LAB — CBC
HCT: 40.3 % (ref 36.0–46.0)
Hemoglobin: 12.8 g/dL (ref 12.0–15.0)
MCH: 25.8 pg — ABNORMAL LOW (ref 26.0–34.0)
MCHC: 31.8 g/dL (ref 30.0–36.0)
MCV: 81.3 fL (ref 80.0–100.0)
Platelets: 210 10*3/uL (ref 150–400)
RBC: 4.96 MIL/uL (ref 3.87–5.11)
RDW: 14.2 % (ref 11.5–15.5)
WBC: 8.5 10*3/uL (ref 4.0–10.5)
nRBC: 0 % (ref 0.0–0.2)

## 2022-06-23 LAB — BASIC METABOLIC PANEL
Anion gap: 7 (ref 5–15)
BUN: 15 mg/dL (ref 6–20)
CO2: 20 mmol/L — ABNORMAL LOW (ref 22–32)
Calcium: 8.8 mg/dL — ABNORMAL LOW (ref 8.9–10.3)
Chloride: 110 mmol/L (ref 98–111)
Creatinine, Ser: 0.75 mg/dL (ref 0.44–1.00)
GFR, Estimated: >60 mL/min (ref >60)
Glucose, Bld: 115 mg/dL — ABNORMAL HIGH (ref 70–99)
Potassium: 3.7 mmol/L (ref 3.5–5.1)
Sodium: 137 mmol/L (ref 135–145)

## 2022-06-23 LAB — I-STAT BETA HCG BLOOD, ED (MC, WL, AP ONLY): I-stat hCG, quantitative: <5 m[IU]/mL (ref <5)

## 2022-06-23 NOTE — ED Triage Notes (Signed)
Has been feeling lightheaded for last couple days. Worse when standing from a sitting position. After several minutes feeling goes away.

## 2022-06-23 NOTE — Discharge Instructions (Signed)
You were evaluated in the Emergency Department and after careful evaluation, we did not find any emergent condition requiring admission or further testing in the hospital.  Your exam/testing today was overall reassuring.  Symptoms likely due to some dehydration or possibly early symptoms of a viral illness.  Recommend plenty of fluids, recommend slowly changing positions from sitting to standing to avoid symptoms.  Please return to the Emergency Department if you experience any worsening of your condition.  Thank you for allowing [place] to be a part of your care.

## 2022-06-23 NOTE — ED Provider Notes (Signed)
[facility]-EMERGENCY DEPT [facility] Emergency Department Provider Note MRN:  [id number]  Arrival date & time: 06/23/22     Chief Complaint   Dizziness   History of Present Illness   Miranda Morrow is a 36 y.o. year-old female with no prior past medical history presenting to the ED with chief complaint of dizziness.  Mild dizziness described as a lightheadedness for the past 2 days.  Intermittent, mostly when she is standing from a seated position.  No pain, no chest pain, no shortness of breath, no rash, no other complaints.  Review of Systems  A thorough review of systems was obtained and all systems are negative except as noted in the HPI and PMH.   Patient's Health History    Past Medical History:  Diagnosis Date   Abnormal Pap smear of cervix 2006   underwent colposcopy followed by    Functional ovarian cysts     Past Surgical History:  Procedure Laterality Date   COLPOSCOPY  2006   CRYOTHERAPY  2006    Family History  Problem Relation Age of Onset   Hypertension Mother    Heart disease Mother        also has pacemaker/AICD   Congestive Heart Failure Mother     Social History   Socioeconomic History   Marital status: Single    Spouse name: Not on file   Number of children: 4   Years of education: 9   Highest education level: Not on file  Occupational History   Occupation: Conservation officer, nature at [employer]  Tobacco Use   Smoking status: Former    Years: 3.00    Types: Cigarettes   Smokeless tobacco: Never  Substance and Sexual Activity   Alcohol use: Not Currently    Alcohol/week: 0.0 standard drinks of alcohol    Comment: occ   Drug use: No   Sexual activity: Not Currently    Birth control/protection: Injection  Other Topics Concern   Not on file  Social History Narrative   Lives at home with her children except for daughter, [name] with her dad's family   Originally from [place]--came down to [place] when in [employer].   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   Social Determinants of Health   Financial Resource Strain: Not on file  Food Insecurity: Not on file  Transportation Needs: Not on file  Physical Activity: Not on file  Stress: Not on file  Social Connections: Not on file  Intimate Partner Violence: Not on file     Physical Exam   Vitals:   06/23/22 0005 06/23/22 0309  BP: 122/76 (!) 119/43  Pulse: (!) 59 (!) 55  Resp: 18 18  Temp: 98.8 F (37.1 C) 99.1 F (37.3 C)  SpO2: 99% 100%    CONSTITUTIONAL: Well-appearing, NAD NEURO/PSYCH:  Alert and oriented x 3, no focal deficits EYES:  eyes equal and reactive ENT/NECK:  no LAD, no JVD CARDIO: Regular rate, well-perfused, normal S1 and S2 PULM:  CTAB no wheezing or rhonchi GI/GU:  non-distended, non-tender MSK/SPINE:  No gross deformities, no edema SKIN:  no rash, atraumatic   *Additional and/or pertinent findings included in MDM below  Diagnostic and Interventional Summary    EKG Interpretation  Date/Time:  06-23-2022 at 03:03:35 Ventricular Rate:   60 PR Interval:   152 QRS Duration:  78 QT Interval: 388   QTC Calculation:388   R Axis:     Text Interpretation: Sinus rhythm, normal intervals  Labs Reviewed  BASIC METABOLIC PANEL - Abnormal; Notable for the following components:      Result Value   CO2 20 (*)    Glucose, Bld 115 (*)    Calcium 8.8 (*)    All other components within normal limits  CBC - Abnormal; Notable for the following components:   MCH 25.8 (*)    All other components within normal limits  URINALYSIS, ROUTINE W REFLEX MICROSCOPIC  I-STAT BETA HCG BLOOD, ED ([facility], [facility], [facility] ONLY)    No orders to display    Medications - No data to display   Procedures  /  Critical Care Procedures  ED Course and Medical Decision Making  Initial Impression and Ddx Seems consistent with orthostatic hypotension.  Patient has no symptoms at this time, reassuring vital signs.  Normal neurological exam, does not describe vertigo.   No chest pain or shortness of breath, highly doubt significant cardiopulmonary process.   Past medical/surgical history that increases complexity of ED encounter: None  Interpretation of Diagnostics I personally reviewed the EKG and my interpretation is as follows: Sinus rhythm with normal intervals, no signs of Brugada or long QT  Labs are reassuring with no significant blood count or electrolyte disturbance, hCG is negative  Patient Reassessment and Ultimate Disposition/Management     Nothing to suggest emergent process, patient explains that her urine is yellow and so she is encouraged to increase her hydration, return precautions provided.  Patient management required discussion with the following services or consulting groups:  None  Complexity of Problems Addressed Acute illness or injury that poses threat of life of bodily function  Additional Data Reviewed and Analyzed Further history obtained from: None  Additional Factors Impacting ED Encounter Risk None  Elmer Sow. Pilar Plate, MD [facility] Emergency Medicine [facility] [email or web address]  Final Clinical Impressions(s) / ED Diagnoses     ICD-10-CM   1. Dizziness  R42       ED Discharge Orders     None        Discharge Instructions Discussed with and Provided to Patient:    Discharge Instructions      You were evaluated in the Emergency Department and after careful evaluation, we did not find any emergent condition requiring admission or further testing in the hospital.  Your exam/testing today was overall reassuring.  Symptoms likely due to some dehydration or possibly early symptoms of a viral illness.  Recommend plenty of fluids, recommend slowly changing positions from sitting to standing to avoid symptoms.  Please return to the Emergency Department if you experience any worsening of your condition.  Thank you for allowing [place] to be a part of your care.       Sabas Sous,  MD 06/23/22 [phone]

## 2022-10-24 ENCOUNTER — Other Ambulatory Visit: Payer: Self-pay

## 2022-10-24 ENCOUNTER — Encounter (HOSPITAL_COMMUNITY): Payer: Self-pay | Admitting: *Deleted

## 2022-10-24 ENCOUNTER — Emergency Department (HOSPITAL_COMMUNITY)
Admission: EM | Admit: 2022-10-24 | Discharge: 2022-10-24 | Disposition: A | Discharge status: Home / Self Care | Payer: Medicaid Other | Attending: Emergency Medicine | Admitting: Emergency Medicine

## 2022-10-24 DIAGNOSIS — J069 Acute upper respiratory infection, unspecified: Secondary | ICD-10-CM | POA: Insufficient documentation

## 2022-10-24 DIAGNOSIS — Z1152 Encounter for screening for COVID-19: Secondary | ICD-10-CM | POA: Insufficient documentation

## 2022-10-24 DIAGNOSIS — R5383 Other fatigue: Secondary | ICD-10-CM | POA: Diagnosis present

## 2022-10-24 LAB — SARS CORONAVIRUS 2 BY RT PCR: SARS Coronavirus 2 by RT PCR: NEGATIVE

## 2022-10-24 NOTE — ED Triage Notes (Signed)
Pt was exposed to covid and has been feeling generally weak and mild nausea for a week and has lost her sense of smell. No sob

## 2022-10-24 NOTE — ED Provider Notes (Signed)
[place] EMERGENCY DEPARTMENT AT [facility] Provider Note   CSN: [id number] Arrival date & time: 10/24/22  1017     History Chief Complaint  Patient presents with   Covid Exposure    HPI Miranda Morrow is a 37 y.o. female presenting for fatigue malaise and COVID exposure.  Denies nausea vomiting syncope or shortness of breath.  Subjective chills, rhinorrhea, congestion, cough   Patient's recorded medical, surgical, social, medication list and allergies were reviewed in the Snapshot window as part of the initial history.   Review of Systems   Review of Systems  Constitutional:  Positive for fatigue. Negative for chills and fever.  HENT:  Negative for ear pain and sore throat.   Eyes:  Negative for pain and visual disturbance.  Respiratory:  Negative for cough and shortness of breath.   Cardiovascular:  Negative for chest pain and palpitations.  Gastrointestinal:  Negative for abdominal pain and vomiting.  Genitourinary:  Negative for dysuria and hematuria.  Musculoskeletal:  Negative for arthralgias and back pain.  Skin:  Negative for color change and rash.  Neurological:  Negative for seizures and syncope.  All other systems reviewed and are negative.   Physical Exam Updated Vital Signs BP 116/80 (BP Location: Left Arm) Comment: Simultaneous filing. User may not have seen previous data. Comment (BP Location): Simultaneous filing. User may not have seen previous data.  Pulse 76 Comment: Simultaneous filing. User may not have seen previous data.  Temp 98.6 F (37 C) (Oral) Comment: Simultaneous filing. User may not have seen previous data. Comment (Src): Simultaneous filing. User may not have seen previous data.  Resp 14 Comment: Simultaneous filing. User may not have seen previous data.  SpO2 98% Comment: Simultaneous filing. User may not have seen previous data. Physical Exam Vitals and nursing note reviewed.  Constitutional:      General: She is not in  acute distress.    Appearance: She is well-developed.  HENT:     Head: Normocephalic and atraumatic.     Nose: Congestion and rhinorrhea present.  Eyes:     Conjunctiva/sclera: Conjunctivae normal.  Cardiovascular:     Rate and Rhythm: Normal rate and regular rhythm.     Heart sounds: No murmur heard. Pulmonary:     Effort: Pulmonary effort is normal. No respiratory distress.     Breath sounds: Normal breath sounds.  Abdominal:     General: There is no distension.     Palpations: Abdomen is soft.     Tenderness: There is no abdominal tenderness. There is no right CVA tenderness or left CVA tenderness.  Musculoskeletal:        General: No swelling or tenderness. Normal range of motion.     Cervical back: Neck supple.  Skin:    General: Skin is warm and dry.  Neurological:     General: No focal deficit present.     Mental Status: She is alert and oriented to person, place, and time. Mental status is at baseline.     Cranial Nerves: No cranial nerve deficit.      ED Course/ Medical Decision Making/ A&P    Procedures Procedures   Medications Ordered in ED Medications - No data to display Medical Decision Making:   VILDA ZOLLNER is a 37 y.o. female who presented to the ED today with subjective fever, cough, congestion detailed above.    omplete initial physical exam performed, notably the patient  was hemodynamically stable in no acute distress.  Posterior oropharynx illuminated and without obvious swelling or deformity.  Patient is without neck stiffness.    Reviewed and confirmed nursing documentation for past medical history, family history, social history.    Initial Assessment:   With the patient's presentation of fever cough congestion, most likely diagnosis is developing viral upper respiratory infection. Other diagnoses were considered including (but not limited to) peritonsillar abscess, retropharyngeal abscess, pneumonia. These are considered less likely due to  history of present illness and physical exam findings.   This is most consistent with an acute complicated illness Considered meningitis, however patient's symptoms, vital signs, physical exam findings including lack of meningismus seem grossly less consistent at this time. Initial Plan:  Viral screening including COVID/flu testing to evaluate for common viral etiologies that need to be tracked Objective evaluation as below reviewed   Initial Study Results:   Laboratory  All laboratory results reviewed without evidence of clinically relevant pathology.       Final Assessment and Plan:   On reassessment, patient is ambulatory tolerating p.o. intake in no acute distress.   Patient's COVID test is negative. Patient is currently stable for outpatient care and management with no indication for hospitalization or transfer at this time.  Discussed all findings with patient expressed understanding.  Disposition:  Based on the above findings, I believe patient is stable for discharge.    Patient/family educated about specific return precautions for given chief complaint and symptoms.  Patient/family educated about follow-up with PCP.     Patient/family expressed understanding of return precautions and need for follow-up. Patient spoken to regarding all imaging and laboratory results and appropriate follow up for these results. All education provided in verbal form with additional information in written form. Time was allowed for answering of patient questions. Patient discharged.    Emergency Department Medication Summary:   Medications - No data to display         Clinical Impression:  1. Upper respiratory tract infection, unspecified type      Discharge    Clinical Impression:  1. Upper respiratory tract infection, unspecified type      Discharge   Final Clinical Impression(s) / ED Diagnoses Final diagnoses:  Upper respiratory tract infection, unspecified type    Rx / DC  Orders ED Discharge Orders     None         Glyn Ade, MD 10/24/22 1134

## 2022-12-09 ENCOUNTER — Encounter (HOSPITAL_COMMUNITY): Payer: Self-pay

## 2022-12-09 ENCOUNTER — Emergency Department (HOSPITAL_COMMUNITY)
Admission: EM | Admit: 2022-12-09 | Discharge: 2022-12-10 | Disposition: A | Discharge status: Home / Self Care | Payer: Medicaid Other | Attending: Emergency Medicine | Admitting: Emergency Medicine

## 2022-12-09 ENCOUNTER — Other Ambulatory Visit: Payer: Self-pay

## 2022-12-09 DIAGNOSIS — R519 Headache, unspecified: Secondary | ICD-10-CM | POA: Insufficient documentation

## 2022-12-09 DIAGNOSIS — R070 Pain in throat: Secondary | ICD-10-CM | POA: Insufficient documentation

## 2022-12-09 MED ORDER — ACETAMINOPHEN 325 MG PO TABS
650.0000 mg | ORAL_TABLET | Freq: Once | ORAL | Status: AC | PRN
  Administered 2022-12-10: 650 mg via ORAL
  Filled 2022-12-09: qty 2

## 2022-12-09 NOTE — ED Triage Notes (Signed)
Patient reports headache for 2 days with throat irritation. Patient states her pain is 10/10. No medication taken at home. Patient states she has mold in her apartment and is unsure if her symptoms are related to that.

## 2022-12-10 LAB — GROUP A STREP BY PCR: Group A Strep by PCR: NOT DETECTED

## 2022-12-10 MED ORDER — IBUPROFEN 800 MG PO TABS
800.0000 mg | ORAL_TABLET | Freq: Once | ORAL | Status: AC
  Administered 2022-12-10: 800 mg via ORAL
  Filled 2022-12-10: qty 1

## 2022-12-10 NOTE — Discharge Instructions (Signed)
Evaluation for headache and throat was overall reassuring.  Suspect your headache may be a migraine or tension headache.  Treatment at this time would be Tylenol and ibuprofen and also recommend rest and hydration at home.  If you have new facial droop, slurred speech, dizziness, new fever, weakness or numbness in your extremities please return emerged part for evaluation.  Also your strep test was negative and your throat looked relatively normal, for this reason suspect that strep throat is unlikely.  However if your symptoms change please follow-up with your PCP.

## 2022-12-10 NOTE — ED Provider Notes (Signed)
[place] EMERGENCY DEPARTMENT AT [facility] Provider Note   CSN: [id number] Arrival date & time: 12/09/22  2257     History  Chief Complaint  Patient presents with   Headache   HPI Miranda Morrow is a 37 y.o. female presenting for headache for the past 2 days and throat irritation.  States her headache came on gradually and has been worse in the last 24 hours.  Denies visual disturbance, nuchal rigidity or fever.  Denies dizziness, loss of coordination, weakness or numbness or her extremities and facial droop.  States that she is recently discovered mold in her apartment and mentioned that her L air filter in her apartment has not been changed for a year.  Concerned this may be causing some of her symptoms.  Emphasized that this is not the worst headache she is ever had.  Has not taken any meds for her headache.  Also mention that she has had some throat irritation.  Mention that one of her children's had strep throat in the past couple weeks.   Headache      Home Medications Prior to Admission medications   Medication Sig Start Date End Date Taking? Authorizing Provider  Acetaminophen (TYLENOL 8 HOUR PO) Take 1 tablet by mouth every 8 (eight) hours as needed (pain).    [provider]  lidocaine (LIDODERM) 5 % Place 1 patch onto the skin daily as needed. Apply patch to area most significant pain once per day.  Remove and discard patch within 12 hours of application. 04/12/22   Petrucelli, Samantha R, PA-C  methocarbamol (ROBAXIN) 500 MG tablet Take 1 tablet (500 mg total) by mouth every 8 (eight) hours as needed for muscle spasms. 04/12/22   Petrucelli, Samantha R, PA-C  naproxen (NAPROSYN) 500 MG tablet Take 1 tablet (500 mg total) by mouth 2 (two) times daily as needed. 04/12/22   Petrucelli, Pleas Koch, PA-C  promethazine (PHENERGAN) 25 MG tablet Take 1 tablet (25 mg total) by mouth every 6 (six) hours as needed for nausea or vomiting. Patient not taking:  Reported on 05/28/2020 04/13/20 05/28/20  Raelyn Mora, CNM      Allergies    Patient has no known allergies.    Review of Systems   Review of Systems  Neurological:  Positive for headaches.    Physical Exam Updated Vital Signs BP 102/61 (BP Location: Right Arm)   Pulse 69   Temp 98.5 F (36.9 C) (Oral)   Resp 18   Ht 4\' 11"  (1.499 m)   Wt 60.8 kg   LMP  (LMP Unknown)   SpO2 100%   BMI 27.06 kg/m  Physical Exam Vitals and nursing note reviewed.  Constitutional:      Appearance: Normal appearance.  HENT:     Head: Normocephalic and atraumatic.     Nose: Nose normal.     Mouth/Throat:     Mouth: Mucous membranes are moist.     Pharynx: Oropharynx is clear. Uvula midline. No pharyngeal swelling, oropharyngeal exudate, posterior oropharyngeal erythema or uvula swelling.  Eyes:     General:        Right eye: No discharge.        Left eye: No discharge.     Conjunctiva/sclera: Conjunctivae normal.  Cardiovascular:     Rate and Rhythm: Normal rate and regular rhythm.     Pulses: Normal pulses.     Heart sounds: Normal heart sounds.  Pulmonary:     Effort: Pulmonary effort is  normal.     Breath sounds: Normal breath sounds.  Abdominal:     General: Abdomen is flat.     Palpations: Abdomen is soft.  Skin:    General: Skin is warm and dry.  Neurological:     General: No focal deficit present.     Mental Status: She is alert.     Comments: GCS 15. Speech is goal oriented. No deficits appreciated to CN III-XII; symmetric eyebrow raise, no facial drooping, tongue midline. Patient has equal grip strength bilaterally with 5/5 strength against resistance in all major muscle groups bilaterally. Sensation to light touch intact. Patient moves extremities without ataxia. Normal finger-nose-finger. Patient ambulatory with steady gait.   Psychiatric:        Mood and Affect: Mood normal.     ED Results / Procedures / Treatments   Labs (all labs ordered are listed, but only  abnormal results are displayed) Labs Reviewed  GROUP A STREP BY PCR    EKG None  Radiology No results found.  Procedures Procedures    Medications Ordered in ED Medications  acetaminophen (TYLENOL) tablet 650 mg (has no administration in time range)  ibuprofen (ADVIL) tablet 800 mg (has no administration in time range)    ED Course/ Medical Decision Making/ A&P                             Medical Decision Making Risk OTC drugs.   37 year old well-appearing female presenting for headache and throat irritation.  Exam was unremarkable.  Posterior nasopharynx appeared grossly normal.  Also without focal neurodeficit.  Doubt SAH given no FND and this is a mild headache per patient by comparison to others she has had in the past.  Treated her headache with Tylenol and ibuprofen.  Suspect it could be a tension type versus migraine.  Advised rest and hydration at home as well.  Doubt strep angitis given reassuring exam and negative PCR.  Discussed pertinent return precautions.  Advised to follow-up with PCP.  Vitals remained stable throughout encounter.  Discharged home.        Final Clinical Impression(s) / ED Diagnoses Final diagnoses:  Nonintractable headache, unspecified chronicity pattern, unspecified headache type    Rx / DC Orders ED Discharge Orders     None         Gareth Eagle, PA-C 12/10/22 0039    Tilden Fossa, MD 12/10/22 [phone]

## 2023-02-12 ENCOUNTER — Emergency Department (HOSPITAL_COMMUNITY)
Admission: EM | Admit: 2023-02-12 | Discharge: 2023-02-12 | Disposition: A | Discharge status: Home / Self Care | Payer: Medicaid Other | Source: Home / Self Care | Attending: Emergency Medicine | Admitting: Emergency Medicine

## 2023-02-12 ENCOUNTER — Other Ambulatory Visit: Payer: Self-pay

## 2023-02-12 ENCOUNTER — Encounter (HOSPITAL_COMMUNITY): Payer: Self-pay

## 2023-02-12 DIAGNOSIS — Z20822 Contact with and (suspected) exposure to covid-19: Secondary | ICD-10-CM | POA: Insufficient documentation

## 2023-02-12 DIAGNOSIS — J069 Acute upper respiratory infection, unspecified: Secondary | ICD-10-CM | POA: Insufficient documentation

## 2023-02-12 DIAGNOSIS — R42 Dizziness and giddiness: Secondary | ICD-10-CM | POA: Diagnosis present

## 2023-02-12 LAB — URINALYSIS, ROUTINE W REFLEX MICROSCOPIC
Bilirubin Urine: NEGATIVE
Glucose, UA: NEGATIVE mg/dL
Hgb urine dipstick: NEGATIVE
Ketones, ur: NEGATIVE mg/dL
Nitrite: NEGATIVE
Protein, ur: NEGATIVE mg/dL
Specific Gravity, Urine: 1.023 (ref 1.005–1.030)
pH: 5 (ref 5.0–8.0)

## 2023-02-12 LAB — PREGNANCY, URINE: Preg Test, Ur: NEGATIVE

## 2023-02-12 LAB — SARS CORONAVIRUS 2 BY RT PCR: SARS Coronavirus 2 by RT PCR: NEGATIVE

## 2023-02-12 NOTE — ED Provider Notes (Signed)
EMERGENCY DEPARTMENT AT [facility] Provider Note   CSN: [id number] Arrival date & time: 02/12/23  1006     History  Chief Complaint  Patient presents with   Dizziness    Miranda Morrow is a 37 y.o. female.  HPI   37 year old female, presents with a complaint of feeling like she is a little bit lightheaded, sweaty, she has had some mild respiratory symptoms including runny nose and nausea, 2 people at her work of been diagnosed with COVID-19.  She denies urinary symptoms, she has not had diarrhea though she thinks that she may have some.  No chest pain or belly pain, no swelling of the legs.  She also feels like she stepped on a piece of glass, she tried to get out of her left foot but still has a small amount of discomfort.  No other complaints today  Home Medications Prior to Admission medications   Medication Sig Start Date End Date Taking? Authorizing Provider  Acetaminophen (TYLENOL 8 HOUR PO) Take 1 tablet by mouth every 8 (eight) hours as needed (pain).    [provider]  lidocaine (LIDODERM) 5 % Place 1 patch onto the skin daily as needed. Apply patch to area most significant pain once per day.  Remove and discard patch within 12 hours of application. 04/12/22   Petrucelli, Samantha R, PA-C  methocarbamol (ROBAXIN) 500 MG tablet Take 1 tablet (500 mg total) by mouth every 8 (eight) hours as needed for muscle spasms. 04/12/22   Petrucelli, Samantha R, PA-C  naproxen (NAPROSYN) 500 MG tablet Take 1 tablet (500 mg total) by mouth 2 (two) times daily as needed. 04/12/22   Petrucelli, Pleas Koch, PA-C  promethazine (PHENERGAN) 25 MG tablet Take 1 tablet (25 mg total) by mouth every 6 (six) hours as needed for nausea or vomiting. Patient not taking: Reported on 05/28/2020 04/13/20 05/28/20  Raelyn Mora, CNM      Allergies    Patient has no known allergies.    Review of Systems   Review of Systems  All other systems reviewed and are  negative.   Physical Exam Updated Vital Signs BP 114/69   Pulse 64   Temp 98.7 F (37.1 C) (Oral)   Resp 18   Ht 1.499 m (4\' 11" )   Wt 61.7 kg   LMP  (Approximate)   SpO2 100%   BMI 27.47 kg/m  Physical Exam Vitals and nursing note reviewed.  Constitutional:      General: She is not in acute distress.    Appearance: She is well-developed.  HENT:     Head: Normocephalic and atraumatic.     Nose: Rhinorrhea present.     Mouth/Throat:     Pharynx: No oropharyngeal exudate.  Eyes:     General: No scleral icterus.       Right eye: No discharge.        Left eye: No discharge.     Conjunctiva/sclera: Conjunctivae normal.     Pupils: Pupils are equal, round, and reactive to light.  Neck:     Thyroid: No thyromegaly.     Vascular: No JVD.  Cardiovascular:     Rate and Rhythm: Normal rate and regular rhythm.     Heart sounds: Normal heart sounds. No murmur heard.    No friction rub. No gallop.  Pulmonary:     Effort: Pulmonary effort is normal. No respiratory distress.     Breath sounds: Normal breath sounds. No wheezing or  rales.  Abdominal:     General: Bowel sounds are normal. There is no distension.     Palpations: Abdomen is soft. There is no mass.     Tenderness: There is no abdominal tenderness.  Musculoskeletal:        General: No tenderness. Normal range of motion.     Cervical back: Normal range of motion and neck supple.     Right lower leg: No edema.     Left lower leg: No edema.  Lymphadenopathy:     Cervical: No cervical adenopathy.  Skin:    General: Skin is warm and dry.     Findings: No erythema or rash.  Neurological:     Mental Status: She is alert.     Coordination: Coordination normal.  Psychiatric:        Behavior: Behavior normal.     ED Results / Procedures / Treatments   Labs (all labs ordered are listed, but only abnormal results are displayed) Labs Reviewed  URINALYSIS, ROUTINE W REFLEX MICROSCOPIC - Abnormal; Notable for the  following components:      Result Value   APPearance HAZY (*)    Leukocytes,Ua SMALL (*)    Bacteria, UA RARE (*)    All other components within normal limits  SARS CORONAVIRUS 2 BY RT PCR  PREGNANCY, URINE    EKG None  Radiology No results found.  Procedures Procedures    Medications Ordered in ED Medications - No data to display  ED Course/ Medical Decision Making/ A&P                                 Medical Decision Making Amount and/or Complexity of Data Reviewed Labs: ordered. Radiology: ordered.   Patient otherwise well-appearing, normal vital signs, rule out COVID pregnancy and foreign body in the foot, patient agreeable.  COVID-negative, not pregnant, no UTI, vitals normal, stable for discharge, likely viral process        Final Clinical Impression(s) / ED Diagnoses Final diagnoses:  Upper respiratory tract infection, unspecified type    Rx / DC Orders ED Discharge Orders     None         Eber Hong, MD 02/12/23 1207

## 2023-02-12 NOTE — Discharge Instructions (Signed)
You do not have COVID based on your test here, I would recommend that you take ibuprofen or Aleve as needed for headache or pains or aches, you may try DayQuil or NyQuil if you are developing runny nose sore throat or headache.  Your vital signs are normal, I would like for you to stay to work today in case you are contagious, please see the attached work note.  ER for worsening symptoms

## 2023-02-12 NOTE — ED Triage Notes (Signed)
Pt states that recently two people at her job had covid and she has been vomiting, pt reports sweats and chills.

## 2023-06-16 ENCOUNTER — Emergency Department (HOSPITAL_COMMUNITY)
Admission: EM | Admit: 2023-06-16 | Discharge: 2023-06-16 | Disposition: A | Discharge status: Home / Self Care | Payer: Medicaid Other | Attending: Emergency Medicine | Admitting: Emergency Medicine

## 2023-06-16 ENCOUNTER — Other Ambulatory Visit: Payer: Self-pay

## 2023-06-16 ENCOUNTER — Emergency Department (HOSPITAL_COMMUNITY): Payer: Medicaid Other

## 2023-06-16 DIAGNOSIS — Z1152 Encounter for screening for COVID-19: Secondary | ICD-10-CM | POA: Insufficient documentation

## 2023-06-16 DIAGNOSIS — R110 Nausea: Secondary | ICD-10-CM | POA: Insufficient documentation

## 2023-06-16 DIAGNOSIS — R0981 Nasal congestion: Secondary | ICD-10-CM | POA: Insufficient documentation

## 2023-06-16 DIAGNOSIS — R531 Weakness: Secondary | ICD-10-CM | POA: Diagnosis not present

## 2023-06-16 DIAGNOSIS — R42 Dizziness and giddiness: Secondary | ICD-10-CM | POA: Insufficient documentation

## 2023-06-16 DIAGNOSIS — R5383 Other fatigue: Secondary | ICD-10-CM | POA: Diagnosis not present

## 2023-06-16 LAB — COMPREHENSIVE METABOLIC PANEL
ALT: 16 U/L (ref 0–44)
AST: 21 U/L (ref 15–41)
Albumin: 4.3 g/dL (ref 3.5–5.0)
Alkaline Phosphatase: 38 U/L (ref 38–126)
Anion gap: 6 (ref 5–15)
BUN: 14 mg/dL (ref 6–20)
CO2: 22 mmol/L (ref 22–32)
Calcium: 8.4 mg/dL — ABNORMAL LOW (ref 8.9–10.3)
Chloride: 105 mmol/L (ref 98–111)
Creatinine, Ser: 0.59 mg/dL (ref 0.44–1.00)
GFR, Estimated: >60 mL/min (ref >60)
Glucose, Bld: 90 mg/dL (ref 70–99)
Potassium: 3.3 mmol/L — ABNORMAL LOW (ref 3.5–5.1)
Sodium: 133 mmol/L — ABNORMAL LOW (ref 135–145)
Total Bilirubin: 0.4 mg/dL (ref <1.2)
Total Protein: 7.1 g/dL (ref 6.5–8.1)

## 2023-06-16 LAB — CBC WITH DIFFERENTIAL/PLATELET
Abs Immature Granulocytes: 0.02 10*3/uL (ref 0.00–0.07)
Basophils Absolute: 0.1 10*3/uL (ref 0.0–0.1)
Basophils Relative: 1 %
Eosinophils Absolute: 0.2 10*3/uL (ref 0.0–0.5)
Eosinophils Relative: 3 %
HCT: 38.6 % (ref 36.0–46.0)
Hemoglobin: 12.2 g/dL (ref 12.0–15.0)
Immature Granulocytes: 0 %
Lymphocytes Relative: 39 %
Lymphs Abs: 3 10*3/uL (ref 0.7–4.0)
MCH: 26.1 pg (ref 26.0–34.0)
MCHC: 31.6 g/dL (ref 30.0–36.0)
MCV: 82.7 fL (ref 80.0–100.0)
Monocytes Absolute: 0.5 10*3/uL (ref 0.1–1.0)
Monocytes Relative: 6 %
Neutro Abs: 3.9 10*3/uL (ref 1.7–7.7)
Neutrophils Relative %: 51 %
Platelets: 194 10*3/uL (ref 150–400)
RBC: 4.67 MIL/uL (ref 3.87–5.11)
RDW: 13.9 % (ref 11.5–15.5)
WBC: 7.6 10*3/uL (ref 4.0–10.5)
nRBC: 0 % (ref 0.0–0.2)

## 2023-06-16 LAB — RESP PANEL BY RT-PCR (RSV, FLU A&B, COVID)  RVPGX2
Influenza A by PCR: NEGATIVE
Influenza B by PCR: NEGATIVE
Resp Syncytial Virus by PCR: NEGATIVE
SARS Coronavirus 2 by RT PCR: NEGATIVE

## 2023-06-16 LAB — LIPASE, BLOOD: Lipase: 27 U/L (ref 11–51)

## 2023-06-16 LAB — TROPONIN I (HIGH SENSITIVITY): Troponin I (High Sensitivity): 2 ng/L (ref <18)

## 2023-06-16 LAB — PREGNANCY, URINE: Preg Test, Ur: NEGATIVE

## 2023-06-16 NOTE — ED Triage Notes (Signed)
Pt reports feeling lightheaded since this morning and reports congestion and fatigue. Pt unsure of LMP

## 2023-06-16 NOTE — Discharge Instructions (Addendum)
Your workup tonight was reassuring. No emergent condition was identified. Please follow up with your primary care provider as needed. If you develop life threatening symptoms return to the emergency department.

## 2023-06-16 NOTE — ED Provider Notes (Signed)
[place] EMERGENCY DEPARTMENT AT [facility] Provider Note   CSN: [id number] Arrival date & time: 06/16/23  0865     History  Chief Complaint  Patient presents with   Nausea   Dizziness   Nasal Congestion    Miranda Morrow is a 37 y.o. female.  Patient reports 2 weeks of fatigue, chest congestion, lightheadedness, nausea.  She states that at work she feels very tired.  She denies fevers, shortness of breath, abdominal pain, emesis, chest pain, urinary symptoms.  Past medical history significant for functional ovarian cyst.   Dizziness      Home Medications Prior to Admission medications   Medication Sig Start Date End Date Taking? Authorizing Provider  Acetaminophen (TYLENOL 8 HOUR PO) Take 1 tablet by mouth every 8 (eight) hours as needed (pain).    [provider]  lidocaine (LIDODERM) 5 % Place 1 patch onto the skin daily as needed. Apply patch to area most significant pain once per day.  Remove and discard patch within 12 hours of application. 04/12/22   Petrucelli, Samantha R, PA-C  methocarbamol (ROBAXIN) 500 MG tablet Take 1 tablet (500 mg total) by mouth every 8 (eight) hours as needed for muscle spasms. 04/12/22   Petrucelli, Samantha R, PA-C  naproxen (NAPROSYN) 500 MG tablet Take 1 tablet (500 mg total) by mouth 2 (two) times daily as needed. 04/12/22   Petrucelli, Pleas Koch, PA-C  promethazine (PHENERGAN) 25 MG tablet Take 1 tablet (25 mg total) by mouth every 6 (six) hours as needed for nausea or vomiting. Patient not taking: Reported on 05/28/2020 04/13/20 05/28/20  Raelyn Mora, CNM      Allergies    Patient has no known allergies.    Review of Systems   Review of Systems  Neurological:  Positive for dizziness.    Physical Exam Updated Vital Signs BP 120/79   Pulse 64   Temp 98.7 F (37.1 C) (Oral)   Resp 18   Ht 4\' 11"  (1.499 m)   Wt 59.4 kg   LMP 06/05/2023   SpO2 100%   BMI 26.46 kg/m  Physical Exam Vitals and  nursing note reviewed.  Constitutional:      General: She is not in acute distress.    Appearance: She is well-developed.  HENT:     Head: Normocephalic and atraumatic.  Eyes:     Conjunctiva/sclera: Conjunctivae normal.  Cardiovascular:     Rate and Rhythm: Normal rate and regular rhythm.  Pulmonary:     Effort: Pulmonary effort is normal. No respiratory distress.     Breath sounds: Normal breath sounds.  Abdominal:     Palpations: Abdomen is soft.     Tenderness: There is no abdominal tenderness.  Musculoskeletal:        General: No swelling.     Cervical back: Neck supple.  Skin:    General: Skin is warm and dry.     Capillary Refill: Capillary refill takes less than 2 seconds.  Neurological:     Mental Status: She is alert.  Psychiatric:        Mood and Affect: Mood normal.     ED Results / Procedures / Treatments   Labs (all labs ordered are listed, but only abnormal results are displayed) Labs Reviewed  COMPREHENSIVE METABOLIC PANEL - Abnormal; Notable for the following components:      Result Value   Sodium 133 (*)    Potassium 3.3 (*)    Calcium 8.4 (*)  All other components within normal limits  RESP PANEL BY RT-PCR (RSV, FLU A&B, COVID)  [id number]  PREGNANCY, URINE  CBC WITH DIFFERENTIAL/PLATELET  LIPASE, BLOOD  TROPONIN I (HIGH SENSITIVITY)    EKG None  Radiology DG Chest 2 View Result Date: 06/16/2023 CLINICAL DATA:  37 year old female with lightheadedness, congestion, fatigue, weakness. EXAM: CHEST - 2 VIEW COMPARISON:  Chest radiographs 10/03/2011. FINDINGS: PA and lateral views 0500 hours. Nipple piercings and hair artifact. Lung volumes and mediastinal contours are normal. Visualized tracheal air column is within normal limits. Both lungs appear clear. No pneumothorax or pleural effusion. Negative visible bowel gas and osseous structures. IMPRESSION: Negative.  No cardiopulmonary abnormality. Electronically Signed   By: Odessa Fleming M.D.   On: 06/16/2023  05:29    Procedures Procedures    Medications Ordered in ED Medications - No data to display  ED Course/ Medical Decision Making/ A&P                                 Medical Decision Making Amount and/or Complexity of Data Reviewed Labs: ordered. Radiology: ordered.   This patient presents to the ED for concern of lightheadedness and fatigue, this involves an extensive number of treatment options, and is a complaint that carries with it a high risk of complications and morbidity.  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  The pertinent results include:  Labs grossly unremarkable   Imaging Studies ordered:  I ordered imaging studies including chest x-ray  I independently visualized and interpreted imaging which showed no acute findings I agree with the radiologist interpretation   Cardiac Monitoring: / EKG:  The patient was maintained on a cardiac monitor.  I personally viewed and interpreted the cardiac monitored which showed an underlying rhythm of: sinus rhythm   Social Determinants of Health:  Patient has Medicaid for her primary health insurance type.    Test / Admission - Considered:  No acute abnormality on imaging or lab work. No sign of ACS or dysrhythmia. No pneumonia on chest x-ray. Negative respiratory panel. Vitals are normal. Symptoms seem most consistent with a  viral process.  Plan to discharge home at this time with return precautions and recommendations for follow up with PCP          Final Clinical Impression(s) / ED Diagnoses Final diagnoses:  Weakness    Rx / DC Orders ED Discharge Orders     None         Pamala Duffel 06/16/23 0981    Laurence Spates, MD 06/16/23 [phone]

## 2023-07-04 NOTE — L&D Delivery Note (Signed)
 OB/GYN Faculty Practice Delivery Note  Miranda Morrow is a 38 y.o. [id number] s/p NSVD at home at [redacted]w[redacted]d. Seen earlier today at MAU for labor evaluation but was not in active labor at the time so she was discharged with return precautions.  ROM: unknown time, intact membranes at 2 PM today GBS Status: negative Maximum Maternal Temperature: 98.44F  Delivery Date/Time: 03/14/24 @ 1611 (per patient) Delivery: Was still considering how far apart her contractions were when she felt urge for bowel movement and stood up from bed to walk to bathroom and began involuntarily pushing. Sat on the side of the bed and baby delivered easily. FOB arrived to the room and called EMS. Cord clamped and cut by EMS. Vital signs stable with EMS and at admission. Unable to collect cord blood. Placenta delivered spontaneously, intact, with 3-vessel cord. Fundus firm with massage. Labia, perineum, vagina, and cervix inspected;  present and hemostatic. Reviewed options with patient, she would prefer not to have any sutures.  Placenta: intact, to L&D Complications: none Lacerations: right labial laceration, hemostatic EBL: 78mL with placenta, unknown amount at home Analgesia: none  Infant: female  APGARs unknown  2990g  Miranda Morrow, [facility] 03/14/2024 6:15 PM

## 2023-07-25 ENCOUNTER — Encounter (HOSPITAL_COMMUNITY): Payer: Self-pay

## 2023-07-25 ENCOUNTER — Other Ambulatory Visit: Payer: Self-pay

## 2023-07-25 ENCOUNTER — Emergency Department (HOSPITAL_COMMUNITY)
Admission: EM | Admit: 2023-07-25 | Discharge: 2023-07-25 | Disposition: A | Discharge status: Home / Self Care | Payer: Medicaid Other | Attending: Emergency Medicine | Admitting: Emergency Medicine

## 2023-07-25 DIAGNOSIS — W108XXA Fall (on) (from) other stairs and steps, initial encounter: Secondary | ICD-10-CM | POA: Diagnosis not present

## 2023-07-25 DIAGNOSIS — Z3A01 Less than 8 weeks gestation of pregnancy: Secondary | ICD-10-CM | POA: Diagnosis not present

## 2023-07-25 DIAGNOSIS — W19XXXA Unspecified fall, initial encounter: Secondary | ICD-10-CM

## 2023-07-25 DIAGNOSIS — Z3201 Encounter for pregnancy test, result positive: Secondary | ICD-10-CM

## 2023-07-25 DIAGNOSIS — M79651 Pain in right thigh: Secondary | ICD-10-CM | POA: Insufficient documentation

## 2023-07-25 DIAGNOSIS — O9A211 Injury, poisoning and certain other consequences of external causes complicating pregnancy, first trimester: Secondary | ICD-10-CM | POA: Insufficient documentation

## 2023-07-25 DIAGNOSIS — O26891 Other specified pregnancy related conditions, first trimester: Secondary | ICD-10-CM | POA: Diagnosis present

## 2023-07-25 LAB — HCG, QUANTITATIVE, PREGNANCY: hCG, Beta Chain, Quant, S: 72053 m[IU]/mL — ABNORMAL HIGH (ref <5)

## 2023-07-25 NOTE — ED Provider Notes (Signed)
[place] EMERGENCY DEPARTMENT AT [facility] Provider Note   CSN: [id number] Arrival date & time: 07/25/23  1607     History  Chief Complaint  Patient presents with   Miranda Morrow    Miranda Morrow is a 38 y.o. female with no noted past medical history presents to emergency department for evaluation of right-sided pain following fall on ice.  She reports that she was walking down steps and slid down 3 steps due to ice.  She attempted to hold herself up by holding onto the railing.  She complains of right thigh, right side pain.  She denies head injury, LOC, abdominal pain, vaginal bleeding, vaginal discharge.  She reports that she took a pregnancy test at home that was +3 days ago but is unsure how far along she is.  She reports that her last period was "sometime the first week of December".  She had negative hCG urine test on 06/16/2023.   Fall Pertinent negatives include no chest pain, no abdominal pain, no headaches and no shortness of breath.     Home Medications Prior to Admission medications   Medication Sig Start Date End Date Taking? Authorizing Provider  Acetaminophen (TYLENOL 8 HOUR PO) Take 1 tablet by mouth every 8 (eight) hours as needed (pain).    [provider]  lidocaine (LIDODERM) 5 % Place 1 patch onto the skin daily as needed. Apply patch to area most significant pain once per day.  Remove and discard patch within 12 hours of application. 04/12/22   Petrucelli, Samantha R, PA-C  methocarbamol (ROBAXIN) 500 MG tablet Take 1 tablet (500 mg total) by mouth every 8 (eight) hours as needed for muscle spasms. 04/12/22   Petrucelli, Samantha R, PA-C  naproxen (NAPROSYN) 500 MG tablet Take 1 tablet (500 mg total) by mouth 2 (two) times daily as needed. 04/12/22   Petrucelli, Pleas Koch, PA-C  promethazine (PHENERGAN) 25 MG tablet Take 1 tablet (25 mg total) by mouth every 6 (six) hours as needed for nausea or vomiting. Patient not taking: Reported on  05/28/2020 04/13/20 05/28/20  Raelyn Mora, CNM      Allergies    Patient has no known allergies.    Review of Systems   Review of Systems  Constitutional:  Negative for chills, fatigue and fever.  Respiratory:  Negative for cough, chest tightness, shortness of breath and wheezing.   Cardiovascular:  Negative for chest pain and palpitations.  Gastrointestinal:  Negative for abdominal pain, constipation, diarrhea, nausea and vomiting.  Neurological:  Negative for dizziness, seizures, weakness, light-headedness, numbness and headaches.    Physical Exam Updated Vital Signs BP (!) 118/47 (BP Location: Left Arm)   Pulse 79   Temp 97.7 F (36.5 C) (Oral)   Resp 18   Ht 4\' 11"  (1.499 m)   Wt 59.9 kg   SpO2 98%   BMI 26.66 kg/m  Physical Exam Vitals and nursing note reviewed.  Constitutional:      General: She is not in acute distress.    Appearance: Normal appearance. She is not diaphoretic.  HENT:     Head: Normocephalic and atraumatic.     Comments: No hematoma nor TTP of cranium No crepitus to facial bones    Right Ear: External ear normal. No hemotympanum.     Left Ear: External ear normal. No hemotympanum.     Nose: Nose normal.     Right Nostril: No epistaxis or septal hematoma.     Left Nostril: No epistaxis  or septal hematoma.     Mouth/Throat:     Mouth: Mucous membranes are moist. No injury or lacerations.  Eyes:     General:        Right eye: No discharge.        Left eye: No discharge.     Extraocular Movements: Extraocular movements intact.     Conjunctiva/sclera: Conjunctivae normal.     Pupils: Pupils are equal, round, and reactive to light.     Comments: No subconjunctival hemorrhage, hyphema, tear drop pupil, or fluid leakage bilaterally  Neck:     Vascular: No carotid bruit.  Cardiovascular:     Rate and Rhythm: Normal rate.     Pulses: Normal pulses.          Radial pulses are 2+ on the right side and 2+ on the left side.       Dorsalis pedis  pulses are 2+ on the right side and 2+ on the left side.  Pulmonary:     Effort: Pulmonary effort is normal. No respiratory distress.     Breath sounds: Normal breath sounds. No wheezing.  Chest:     Chest wall: No tenderness.  Abdominal:     General: Bowel sounds are normal. There is no distension.     Palpations: Abdomen is soft.     Tenderness: There is no abdominal tenderness. There is no guarding or rebound.     Comments: Mild abdominal rounding consistent with early pregnancy  Musculoskeletal:     Cervical back: Full passive range of motion without pain, normal range of motion and neck supple. No deformity, rigidity, tenderness or bony tenderness. Normal range of motion.     Thoracic back: No deformity or bony tenderness. Normal range of motion.     Lumbar back: No deformity or bony tenderness. Normal range of motion.     Right hip: No bony tenderness or crepitus.     Left hip: No bony tenderness or crepitus.     Right lower leg: No edema.     Left lower leg: No edema.     Comments: Mild TTP of right lateral thigh without bony tenderness,  muscle over right hip without bony tenderness, right side of abdomen.  No overlying ecchymosis.  Skin:    General: Skin is warm and dry.     Capillary Refill: Capillary refill takes less than 2 seconds.     Coloration: Skin is not jaundiced or pale.  Neurological:     General: No focal deficit present.     Mental Status: She is alert and oriented to person, place, and time. Mental status is at baseline.     GCS: GCS eye subscore is 4. GCS verbal subscore is 5. GCS motor subscore is 6.     Cranial Nerves: Cranial nerves 2-12 are intact. No cranial nerve deficit.     Sensory: Sensation is intact. No sensory deficit.     Motor: Motor function is intact. No weakness or tremor.     Coordination: Coordination is intact. Coordination normal. Finger-Nose-Finger Test and Heel to [facility] Test normal.     Gait: Gait is intact. Gait normal.     Deep Tendon  Reflexes: Reflexes are normal and symmetric. Reflexes normal.     Comments: Follows commands appropriately and ambulates without difficulty    ED Results / Procedures / Treatments   Labs (all labs ordered are listed, but only abnormal results are displayed) Labs Reviewed  HCG, QUANTITATIVE, PREGNANCY - Abnormal; Notable  for the following components:      Result Value   hCG, Beta Chain, Quant, S 72,053 (*)    All other components within normal limits    EKG None  Radiology No results found.  Procedures Procedures    Medications Ordered in ED Medications - No data to display  ED Course/ Medical Decision Making/ A&P                                 Medical Decision Making Amount and/or Complexity of Data Reviewed Labs: ordered.   Patient presents to the ED for concern of pain following fall, this involves an extensive number of treatment options, and is a complaint that carries with it a high risk of complications and morbidity.  The differential diagnosis includes  contusion, less likely fracture, miscarriage, intra-abdominal pathology.   Co morbidities that complicate the patient evaluation  None   Additional history obtained:  Additional history obtained from Nursing and Outside Medical Records   External records from outside source obtained and reviewed including  Triage RN note Recent ED visit from 06/16/2023 with negative hCG urine   Lab Tests:  I Ordered, and personally interpreted labs.  The pertinent results include:   hCG 72053    Problem List / ED Course:  Fall initial encounter Initially had mild limp on right side however this is completely resolved and she no longer has abnormal gait No bony tenderness to suggest fracture so I do not feel that imaging is required I offered Tylenol for analgesia and she reports that "pain is not that bad" and she declined Elevated hCG No abdominal pain, cramping, vaginal bleeding, vaginal discharge hCG  72,053 consistent with 6 to 12 weeks however may be on earlier side as she had a negative urine pregnancy 12/14 with menses reported on first week of December.  Too early for fetal heart tones and toco As patient has no abdominal pain nor tenderness, vaginal bleeding, vaginal discharge, I do not feel that ultrasound is required at this time Provided OB/GYN follow-up for patient Suggest using prenatals until she is able to get an appointment Discussed very strict return to emergency department precautions with patient expresses understanding with the plan.  All questions answered to her satisfaction.  She agrees with discharge.   Reevaluation:  After the interventions noted above, I reevaluated the patient and found that they have :stayed the same   Social Determinants of Health:  Provided OB/GYN follow-up   Dispostion:  After consideration of the diagnostic results and the patients response to treatment, I feel that the patent would benefit from outpatient management with OB/GYN follow-up.   Discussed patient with Dr. Rubin Payor who reviewed her ED workup and agrees with plan based on reassuring PE. Final Clinical Impression(s) / ED Diagnoses Final diagnoses:  Fall, initial encounter  Positive blood pregnancy test    Rx / DC Orders ED Discharge Orders     None         Judithann Sheen, PA 07/25/23 Ayesha Mohair    Benjiman Core, MD 07/25/23 [phone]

## 2023-07-25 NOTE — ED Provider Triage Note (Signed)
Emergency Medicine Provider Triage Evaluation Note  MAEGAN FULLMAN , a 38 y.o. female  was evaluated in triage.  Pt complains of . Walking down steps and slid on ice. Slipped down three steps and slid down right side of body while holding onto rail. 3-4/10 pain. Did not hit head, no loc  Took pregnancy test at home and had positive test three days ago but unsure how far along she is. Negative hcg urine test on 06/16/23  Review of Systems  Positive: Right side pain Negative: Head injury, loc, neck pain, vaginal bleeding, abd pain  Physical Exam  BP (!) 118/47 (BP Location: Left Arm)   Pulse 79   Temp 97.7 F (36.5 C) (Oral)   Resp 18   Ht 4\' 11"  (1.499 m)   Wt 59.9 kg   SpO2 98%   BMI 26.66 kg/m  Gen:   Awake, no distress   Resp:  Normal effort  MSK:   Moves extremities without difficulty  Other:  TTP of right back, right lateral thigh, musculature of right hip (no bony tenderness). Ambulates right mild limp to right side. No abd ttp  Medical Decision Making  Medically screening exam initiated at 4:21 PM.  Appropriate orders placed.  BITTANY PIERSMA was informed that the remainder of the evaluation will be completed by another provider, this initial triage assessment does not replace that evaluation, and the importance of remaining in the ED until their evaluation is complete.  Denies analgesia. Will get Emogene Morgan, [place] 07/25/23 [phone]

## 2023-07-25 NOTE — Discharge Instructions (Addendum)
Thank you for letting us evaluate you today.  You are anywhere from 6-[redacted] weeks pregnant.  Your hCG is 72,000.  As you are not tender on bony processes and her able to ambulate I do not feel that x-ray is required at this time as I have low suspicion for fracture.  I have provided you with OB/GYN follow-up for you to follow-up and obtain prenatal care.  Please start taking prenatal vitamins.  ---Return to emergency department if you experience vaginal bleeding, abdominal pain, lower abdominal cramping, vaginal discharge or fluid.

## 2023-07-25 NOTE — ED Triage Notes (Signed)
Pt had a fall this morning, catching herself but hurting her left side and back. Pt recently found out that she was pregnant and wants to make sure she is okay.

## 2023-07-26 ENCOUNTER — Other Ambulatory Visit: Payer: Self-pay

## 2023-07-26 DIAGNOSIS — Z3490 Encounter for supervision of normal pregnancy, unspecified, unspecified trimester: Secondary | ICD-10-CM

## 2023-08-07 ENCOUNTER — Ambulatory Visit (HOSPITAL_COMMUNITY)
Admission: RE | Admit: 2023-08-07 | Discharge: 2023-08-07 | Disposition: A | Discharge status: Home / Self Care | Payer: Medicaid Other | Source: Ambulatory Visit | Attending: Obstetrics and Gynecology | Admitting: Obstetrics and Gynecology

## 2023-08-07 ENCOUNTER — Other Ambulatory Visit: Payer: Self-pay | Admitting: Obstetrics and Gynecology

## 2023-08-07 ENCOUNTER — Encounter: Payer: Self-pay | Admitting: Obstetrics and Gynecology

## 2023-08-07 DIAGNOSIS — Z3491 Encounter for supervision of normal pregnancy, unspecified, first trimester: Secondary | ICD-10-CM | POA: Diagnosis not present

## 2023-08-07 DIAGNOSIS — Z3490 Encounter for supervision of normal pregnancy, unspecified, unspecified trimester: Secondary | ICD-10-CM

## 2023-08-07 DIAGNOSIS — Z3A08 8 weeks gestation of pregnancy: Secondary | ICD-10-CM | POA: Insufficient documentation

## 2023-08-09 ENCOUNTER — Encounter (HOSPITAL_COMMUNITY): Payer: Self-pay | Admitting: *Deleted

## 2023-08-09 ENCOUNTER — Inpatient Hospital Stay (HOSPITAL_COMMUNITY)
Admission: AD | Admit: 2023-08-09 | Discharge: 2023-08-10 | Disposition: A | Discharge status: Home / Self Care | Payer: Medicaid Other | Attending: Obstetrics and Gynecology | Admitting: Obstetrics and Gynecology

## 2023-08-09 DIAGNOSIS — E860 Dehydration: Secondary | ICD-10-CM | POA: Diagnosis not present

## 2023-08-09 DIAGNOSIS — Z3A09 9 weeks gestation of pregnancy: Secondary | ICD-10-CM

## 2023-08-09 DIAGNOSIS — O09521 Supervision of elderly multigravida, first trimester: Secondary | ICD-10-CM | POA: Insufficient documentation

## 2023-08-09 DIAGNOSIS — O210 Mild hyperemesis gravidarum: Secondary | ICD-10-CM | POA: Diagnosis present

## 2023-08-09 DIAGNOSIS — O211 Hyperemesis gravidarum with metabolic disturbance: Secondary | ICD-10-CM | POA: Diagnosis not present

## 2023-08-09 LAB — URINALYSIS, ROUTINE W REFLEX MICROSCOPIC
Bacteria, UA: NONE SEEN
Bilirubin Urine: NEGATIVE
Glucose, UA: NEGATIVE mg/dL
Hgb urine dipstick: NEGATIVE
Ketones, ur: 80 mg/dL — AB
Leukocytes,Ua: NEGATIVE
Nitrite: NEGATIVE
Protein, ur: 30 mg/dL — AB
Specific Gravity, Urine: 1.024 (ref 1.005–1.030)
pH: 6 (ref 5.0–8.0)

## 2023-08-09 LAB — COMPREHENSIVE METABOLIC PANEL
ALT: 15 U/L (ref 0–44)
AST: 17 U/L (ref 15–41)
Albumin: 3.7 g/dL (ref 3.5–5.0)
Alkaline Phosphatase: 32 U/L — ABNORMAL LOW (ref 38–126)
Anion gap: 9 (ref 5–15)
BUN: 6 mg/dL (ref 6–20)
CO2: 21 mmol/L — ABNORMAL LOW (ref 22–32)
Calcium: 8.9 mg/dL (ref 8.9–10.3)
Chloride: 103 mmol/L (ref 98–111)
Creatinine, Ser: 0.61 mg/dL (ref 0.44–1.00)
GFR, Estimated: >60 mL/min (ref >60)
Glucose, Bld: 86 mg/dL (ref 70–99)
Potassium: 3.5 mmol/L (ref 3.5–5.1)
Sodium: 133 mmol/L — ABNORMAL LOW (ref 135–145)
Total Bilirubin: 0.6 mg/dL (ref 0.0–1.2)
Total Protein: 6.9 g/dL (ref 6.5–8.1)

## 2023-08-09 MED ORDER — FAMOTIDINE IN NACL 20-0.9 MG/50ML-% IV SOLN
20.0000 mg | Freq: Once | INTRAVENOUS | Status: AC
  Administered 2023-08-09: 20 mg via INTRAVENOUS
  Filled 2023-08-09: qty 50

## 2023-08-09 MED ORDER — LACTATED RINGERS IV BOLUS
1000.0000 mL | Freq: Once | INTRAVENOUS | Status: AC
  Administered 2023-08-09: 1000 mL via INTRAVENOUS

## 2023-08-09 MED ORDER — SODIUM CHLORIDE 0.9 % IV SOLN
12.5000 mg | Freq: Once | INTRAVENOUS | Status: AC
  Administered 2023-08-09: 12.5 mg via INTRAVENOUS
  Filled 2023-08-09: qty 12.5

## 2023-08-09 MED ORDER — ONDANSETRON HCL 4 MG/2ML IJ SOLN
4.0000 mg | Freq: Once | INTRAMUSCULAR | Status: AC
  Administered 2023-08-09: 4 mg via INTRAVENOUS
  Filled 2023-08-09: qty 2

## 2023-08-09 NOTE — MAU Provider Note (Signed)
 Chief Complaint: Emesis During Pregnancy and Dizziness  SUBJECTIVE HPI: Miranda Morrow is a 38 y.o. [id number] at [redacted]w[redacted]d by LMP who presents to maternity admissions reporting N/V.  Patient has been vomiting frequently for past 2 days. No sick contacts, fever/chills, congestion, cough. Streaks of blood in vomit at times. No lower abdominal pain. Has some upper right before she vomits. Feels lightheaded when she stands up. No nausea meds at home. Denies VB, LOF, cramping, urinary symptoms. Has not established PNC yet.  HPI  Past Medical History:  Diagnosis Date   Abnormal Pap smear of cervix 2006   underwent colposcopy followed by    Functional ovarian cysts    Past Surgical History:  Procedure Laterality Date   COLPOSCOPY  2006   CRYOTHERAPY  2006   Social History   Socioeconomic History   Marital status: Single    Spouse name: Not on file   Number of children: 4   Years of education: 9   Highest education level: Not on file  Occupational History   Occupation: Conservation Officer, [employer] at [employer]  Tobacco Use   Smoking status: Former    Types: Cigarettes   Smokeless tobacco: Never  Substance and Sexual Activity   Alcohol use: Not Currently    Alcohol/week: 0.0 standard drinks of alcohol    Comment: occ   Drug use: No   Sexual activity: Not Currently    Birth control/protection: None  Other Topics Concern   Not on file  Social History Narrative   Lives at home with her children except for daughter, Bobie with her dad's family   Originally from [place]--came down to [place] when in [employer].   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   Social Drivers of Corporate Investment Banker Strain: Not on file  Food Insecurity: Not on file  Transportation Needs: Not on file  Physical Activity: Not on file  Stress: Not on file  Social Connections: Not on file  Intimate Partner Violence: Not on file   No current facility-administered medications on file prior to encounter.    Current Outpatient Medications on File Prior to Encounter  Medication Sig Dispense Refill   Acetaminophen  (TYLENOL  8 HOUR PO) Take 1 tablet by mouth every 8 (eight) hours as needed (pain). (Patient not taking: Reported on 08/09/2023)     lidocaine  (LIDODERM ) 5 % Place 1 patch onto the skin daily as needed. Apply patch to area most significant pain once per day.  Remove and discard patch within 12 hours of application. (Patient not taking: Reported on 08/09/2023) 21 patch 0   methocarbamol  (ROBAXIN ) 500 MG tablet Take 1 tablet (500 mg total) by mouth every 8 (eight) hours as needed for muscle spasms. (Patient not taking: Reported on 08/09/2023) 15 tablet 0   naproxen  (NAPROSYN ) 500 MG tablet Take 1 tablet (500 mg total) by mouth 2 (two) times daily as needed. 15 tablet 0   No Known Allergies  ROS:  Pertinent positives/negatives listed above.  I have reviewed patient's Past Medical Hx, Surgical Hx, Family Hx, Social Hx, medications and allergies.   Physical Exam  Patient Vitals for the past 24 hrs:  BP Temp Pulse Resp SpO2 Height Weight  08/10/23 0044 (!) 108/55 -- 60 -- -- -- --  08/09/23 2233 120/72 -- -- -- -- -- --  08/09/23 2229 -- 98.5 F (36.9 C) 63 17 100 % 4' 11 (1.499 m) 54 kg   Constitutional: Well-developed, well-nourished female who appears fatigued. Wrapped up in  warm blankets Cardiovascular: normal rate Respiratory: normal effort GI: Abd soft, non-tender MS: Extremities nontender, no edema, normal ROM Neurologic: Alert and oriented x 4  GU: Neg CVAT  LAB RESULTS Results for orders placed or performed during the hospital encounter of 08/09/23 (from the past 24 hours)  Urinalysis, Routine w reflex microscopic -Urine, Clean Catch     Status: Abnormal   Collection Time: 08/09/23 10:45 PM  Result Value Ref Range   Color, Urine YELLOW YELLOW   APPearance HAZY (A) CLEAR   Specific Gravity, Urine 1.024 1.005 - 1.030   pH 6.0 5.0 - 8.0   Glucose, UA NEGATIVE NEGATIVE mg/dL    Hgb urine dipstick NEGATIVE NEGATIVE   Bilirubin Urine NEGATIVE NEGATIVE   Ketones, ur 80 (A) NEGATIVE mg/dL   Protein, ur 30 (A) NEGATIVE mg/dL   Nitrite NEGATIVE NEGATIVE   Leukocytes,Ua NEGATIVE NEGATIVE   RBC / HPF 0-5 0 - 5 RBC/hpf   WBC, UA 0-5 0 - 5 WBC/hpf   Bacteria, UA NONE SEEN NONE SEEN   Squamous Epithelial / HPF 6-10 0 - 5 /HPF   Mucus PRESENT   Comprehensive metabolic panel     Status: Abnormal   Collection Time: 08/09/23 10:53 PM  Result Value Ref Range   Sodium 133 (L) 135 - 145 mmol/L   Potassium 3.5 3.5 - 5.1 mmol/L   Chloride 103 98 - 111 mmol/L   CO2 21 (L) 22 - 32 mmol/L   Glucose, Bld 86 70 - 99 mg/dL   BUN 6 6 - 20 mg/dL   Creatinine, Ser 9.38 0.44 - 1.00 mg/dL   Calcium 8.9 8.9 - 89.6 mg/dL   Total Protein 6.9 6.5 - 8.1 g/dL   Albumin 3.7 3.5 - 5.0 g/dL   AST 17 15 - 41 U/L   ALT 15 0 - 44 U/L   Alkaline Phosphatase 32 (L) 38 - 126 U/L   Total Bilirubin 0.6 0.0 - 1.2 mg/dL   GFR, Estimated >39 >39 mL/min   Anion gap 9 5 - 15       IMAGING US  OB Comp Less 14 Wks Result Date: 08/07/2023 : PROCEDURE: OBSTETRIC <14 WK ULTRASOUND HISTORY: Patient is a 38 y/o F with dating. B-HCG=72053 (07/25/23). LMP unknown. ANSLEIGH.BAPTIST. COMPARISON: None available. TECHNIQUE: Two-dimensional transabdominal grayscale ultrasound imaging of the pelvis was performed with color Doppler evaluation. Transvaginal ultrasound was not performed. FINDINGS: The uterus measures 7.6 x 7.0 x 5.3 cm and demonstrates a normal homogeneous echotexture. The cervical os is closed. The right ovary measures 4.8 x 2.6 x 2.1 cm and demonstrates a normal homogeneous echotexture. There is normal color Doppler flow. The left ovary is not visualized. There is no fluid within the cul-de-sac. There is a single, live intrauterine gestation identified with a fetal heart rate of 165 beats per minute and a crown-rump length measurement of 1.8 cm, correlating to a gestational age of [redacted] weeks 2 day(s) (+/-5 day(s). A  yolk sac is visualized. There is no subchorionic hemorrhage visualized. IMPRESSION: 1. Single, live intrauterine gestation. Today's ultrasound measurements correlate with a gestational age of [redacted] weeks 2 day(s) (+/-5 day(s). EDD by today's ultrasound is March 16, 2024. 2.  Nonvisualization of the left ovary. Thank you for allowing us  to assist in the care of this patient. Electronically Signed   By: Lynwood Mains M.D.   On: 08/07/2023 13:43    MAU Management/MDM: Orders Placed This Encounter  Procedures   Urinalysis, Routine w reflex microscopic -Urine, Clean Catch  Comprehensive metabolic panel   Discharge patient    Meds ordered this encounter  Medications   lactated ringers  bolus 1,000 mL   ondansetron  (ZOFRAN ) injection 4 mg   promethazine  (PHENERGAN ) 12.5 mg in sodium chloride  0.9 % 50 mL IVPB   famotidine  (PEPCID ) IVPB 20 mg premix   ondansetron  (ZOFRAN -ODT) 4 MG disintegrating tablet    Sig: Take 1 tablet (4 mg total) by mouth every 8 (eight) hours as needed for nausea or vomiting.    Dispense:  90 tablet    Refill:  0   promethazine  (PHENERGAN ) 12.5 MG tablet    Sig: Take 1 tablet (12.5 mg total) by mouth every 6 (six) hours as needed for nausea or vomiting.    Dispense:  90 tablet    Refill:  0   pyridOXINE (VITAMIN B6) 50 MG tablet    Sig: Take 1 tablet (50 mg total) by mouth daily.    Dispense:  90 tablet    Refill:  0   doxylamine , Sleep, (UNISOM ) 25 MG tablet    Sig: Take 1 tablet (25 mg total) by mouth at bedtime.    Dispense:  90 tablet    Refill:  0    Patient presents with N/V x2 days in first trimester. Has confirmed IUP. Do not suspect flu/COVID at this time, however certainly could be viral gastroenteritis vs morning sickness vs both. Reassuringly benign abdomen. Do not suspect miscarriage, other intra-abdominal process at this time. Will obtain UA, CMP. LR, zofran , phenergan , famotidine  for treatment of symptoms.  Patient feeling much improved after above  interventions. Able to keep down drink and crackers. Discharge home with dicelgis, zofran , phenergan .  ASSESSMENT 1. Hyperemesis affecting pregnancy, antepartum   2. Dehydration   3. [redacted] weeks gestation of pregnancy     PLAN Discharge home with strict return precautions. Allergies as of 08/10/2023   No Known Allergies      Medication List     STOP taking these medications    lidocaine  5 % Commonly known as: Lidoderm    methocarbamol  500 MG tablet Commonly known as: ROBAXIN    naproxen  500 MG tablet Commonly known as: NAPROSYN    TYLENOL  8 HOUR PO       TAKE these medications    doxylamine  (Sleep) 25 MG tablet Commonly known as: UNISOM  Take 1 tablet (25 mg total) by mouth at bedtime.   ondansetron  4 MG disintegrating tablet Commonly known as: ZOFRAN -ODT Take 1 tablet (4 mg total) by mouth every 8 (eight) hours as needed for nausea or vomiting.   promethazine  12.5 MG tablet Commonly known as: PHENERGAN  Take 1 tablet (12.5 mg total) by mouth every 6 (six) hours as needed for nausea or vomiting.   pyridOXINE 50 MG tablet Commonly known as: VITAMIN B6 Take 1 tablet (50 mg total) by mouth daily.         Almarie Moats, MD OB Fellow 08/10/2023  1:04 AM

## 2023-08-09 NOTE — MAU Note (Addendum)
.  Miranda Morrow is a 38 y.o. at [redacted]w[redacted]d here in MAU reporting n/v for 2 days. Today sees blood in emesis. Very lightheaded when standing. Denies vag bleeding. No pain except when she throws up. Has not started care yet so has no nausea meds at home  LMP: unknown per pt. 12 /3/24 was already documented Onset of complaint: 2 days Pain score: 0 Vitals:   08/09/23 2229 08/09/23 2233  BP:  120/72  Pulse: 63   Resp: 17   Temp: 98.5 F (36.9 C)   SpO2: 100%      FHT: n/a  Lab orders placed from triage: u/a

## 2023-08-10 DIAGNOSIS — E860 Dehydration: Secondary | ICD-10-CM | POA: Diagnosis not present

## 2023-08-10 DIAGNOSIS — Z3A09 9 weeks gestation of pregnancy: Secondary | ICD-10-CM | POA: Diagnosis not present

## 2023-08-10 DIAGNOSIS — O210 Mild hyperemesis gravidarum: Secondary | ICD-10-CM | POA: Diagnosis not present

## 2023-08-10 MED ORDER — DOXYLAMINE SUCCINATE (SLEEP) 25 MG PO TABS: 25.0000 mg | ORAL_TABLET | Freq: Every day | ORAL | 0 refills | Status: DC

## 2023-08-10 MED ORDER — PROMETHAZINE HCL 12.5 MG PO TABS: 12.5000 mg | ORAL_TABLET | Freq: Four times a day (QID) | ORAL | 0 refills | Status: DC | PRN

## 2023-08-10 MED ORDER — ONDANSETRON 4 MG PO TBDP: 4.0000 mg | ORAL_TABLET | Freq: Three times a day (TID) | ORAL | 0 refills | Status: DC | PRN

## 2023-08-10 MED ORDER — VITAMIN B-6 50 MG PO TABS: 50.0000 mg | ORAL_TABLET | Freq: Every day | ORAL | 0 refills | Status: DC

## 2023-08-10 NOTE — MAU Note (Signed)
 Pt awake and states she feels better. States she got up to the BR and felt alittle light headed so she laid back down. Reminded pt to move slowly when changing positions. Asked pt about someone coming to pick her up and she first said No and then let me see as she started working with her phone. Pt's car is at the main ED.

## 2023-08-10 NOTE — MAU Note (Signed)
 Pt's support person came to get her and pt d/c by w/c with support person.

## 2023-08-10 NOTE — MAU Note (Signed)
 Pt up to BR. Has water at bedside. Will try crackers. Does not want anything else to drink

## 2023-08-10 NOTE — Progress Notes (Signed)
 Pt was too sleepy at time for d/c to drive home. She told RN after receiving meds that she had driven herself. Pt came to MAU from the main ED. Pt has been sleeping after receiving her d/c papers per her request. Will go home once she has slept awhile and feels she can safely drive home.Someone was with her earlier for support but left before her d/c.  No vomiting since admission.

## 2023-08-10 NOTE — Progress Notes (Signed)
 Written and verbal d/c instructions given and pt voiced understanding.

## 2023-08-10 NOTE — MAU Note (Signed)
 Pt awake and stays someone is coming to get her. Request work note which was given

## 2023-08-23 ENCOUNTER — Telehealth: Payer: Medicaid Other

## 2023-08-28 ENCOUNTER — Encounter: Payer: Medicaid Other | Admitting: Obstetrics and Gynecology

## 2023-08-29 ENCOUNTER — Inpatient Hospital Stay (HOSPITAL_COMMUNITY)
Admission: AD | Admit: 2023-08-29 | Discharge: 2023-08-30 | Disposition: A | Discharge status: Home / Self Care | Payer: Medicaid Other | Attending: Obstetrics and Gynecology | Admitting: Obstetrics and Gynecology

## 2023-08-29 ENCOUNTER — Other Ambulatory Visit: Payer: Self-pay

## 2023-08-29 DIAGNOSIS — O211 Hyperemesis gravidarum with metabolic disturbance: Secondary | ICD-10-CM | POA: Insufficient documentation

## 2023-08-29 DIAGNOSIS — K5900 Constipation, unspecified: Secondary | ICD-10-CM | POA: Insufficient documentation

## 2023-08-29 DIAGNOSIS — O210 Mild hyperemesis gravidarum: Secondary | ICD-10-CM | POA: Diagnosis not present

## 2023-08-29 DIAGNOSIS — O219 Vomiting of pregnancy, unspecified: Secondary | ICD-10-CM

## 2023-08-29 DIAGNOSIS — O09521 Supervision of elderly multigravida, first trimester: Secondary | ICD-10-CM | POA: Insufficient documentation

## 2023-08-29 DIAGNOSIS — O99611 Diseases of the digestive system complicating pregnancy, first trimester: Secondary | ICD-10-CM | POA: Insufficient documentation

## 2023-08-29 DIAGNOSIS — Z3A12 12 weeks gestation of pregnancy: Secondary | ICD-10-CM | POA: Diagnosis not present

## 2023-08-29 LAB — URINALYSIS, ROUTINE W REFLEX MICROSCOPIC
Bacteria, UA: NONE SEEN
Bilirubin Urine: NEGATIVE
Glucose, UA: NEGATIVE mg/dL
Ketones, ur: 80 mg/dL — AB
Leukocytes,Ua: NEGATIVE
Nitrite: NEGATIVE
Protein, ur: NEGATIVE mg/dL
Specific Gravity, Urine: 1.024 (ref 1.005–1.030)
pH: 5 (ref 5.0–8.0)

## 2023-08-29 MED ORDER — PROCHLORPERAZINE MALEATE 10 MG PO TABS
10.0000 mg | ORAL_TABLET | Freq: Once | ORAL | Status: DC
  Filled 2023-08-29: qty 1

## 2023-08-29 MED ORDER — LACTATED RINGERS IV BOLUS
1000.0000 mL | Freq: Once | INTRAVENOUS | Status: AC
Start: 2023-08-30 — End: 2023-08-30
  Administered 2023-08-30: 1000 mL via INTRAVENOUS

## 2023-08-29 MED ORDER — SCOPOLAMINE 1 MG/3DAYS TD PT72
1.0000 | MEDICATED_PATCH | Freq: Once | TRANSDERMAL | Status: DC
  Administered 2023-08-30: 1.5 mg via TRANSDERMAL
  Filled 2023-08-29: qty 1

## 2023-08-29 NOTE — MAU Note (Signed)
.  Miranda Morrow is a 38 y.o. at [redacted]w[redacted]d here in MAU reporting: N/V all day. "Can't eat, constipated, and throwing up blood. The medicine you all gave me isn't working". Denies pain, VB, or abnormal discharge.   Onset of complaint: 0600 Pain score: 0 Vitals:   08/29/23 2254  BP: 116/60  Pulse: (!) 104  Resp: 18  Temp: 98.4 F (36.9 C)  SpO2: 100%     FHT: 160  Lab orders placed from triage: UA

## 2023-08-30 DIAGNOSIS — Z3A12 12 weeks gestation of pregnancy: Secondary | ICD-10-CM

## 2023-08-30 DIAGNOSIS — O210 Mild hyperemesis gravidarum: Secondary | ICD-10-CM

## 2023-08-30 LAB — COMPREHENSIVE METABOLIC PANEL
ALT: 13 U/L (ref 0–44)
AST: 16 U/L (ref 15–41)
Albumin: 3.5 g/dL (ref 3.5–5.0)
Alkaline Phosphatase: 24 U/L — ABNORMAL LOW (ref 38–126)
Anion gap: 14 (ref 5–15)
BUN: 6 mg/dL (ref 6–20)
CO2: 17 mmol/L — ABNORMAL LOW (ref 22–32)
Calcium: 8.9 mg/dL (ref 8.9–10.3)
Chloride: 104 mmol/L (ref 98–111)
Creatinine, Ser: 0.49 mg/dL (ref 0.44–1.00)
GFR, Estimated: >60 mL/min (ref >60)
Glucose, Bld: 81 mg/dL (ref 70–99)
Potassium: 3.4 mmol/L — ABNORMAL LOW (ref 3.5–5.1)
Sodium: 135 mmol/L (ref 135–145)
Total Bilirubin: 0.7 mg/dL (ref 0.0–1.2)
Total Protein: 6.4 g/dL — ABNORMAL LOW (ref 6.5–8.1)

## 2023-08-30 LAB — CBC WITH DIFFERENTIAL/PLATELET
Abs Immature Granulocytes: 0.03 10*3/uL (ref 0.00–0.07)
Basophils Absolute: 0.1 10*3/uL (ref 0.0–0.1)
Basophils Relative: 1 %
Eosinophils Absolute: 0.1 10*3/uL (ref 0.0–0.5)
Eosinophils Relative: 1 %
HCT: 35.2 % — ABNORMAL LOW (ref 36.0–46.0)
Hemoglobin: 11.6 g/dL — ABNORMAL LOW (ref 12.0–15.0)
Immature Granulocytes: 0 %
Lymphocytes Relative: 23 %
Lymphs Abs: 2.2 10*3/uL (ref 0.7–4.0)
MCH: 25.9 pg — ABNORMAL LOW (ref 26.0–34.0)
MCHC: 33 g/dL (ref 30.0–36.0)
MCV: 78.6 fL — ABNORMAL LOW (ref 80.0–100.0)
Monocytes Absolute: 0.6 10*3/uL (ref 0.1–1.0)
Monocytes Relative: 6 %
Neutro Abs: 6.7 10*3/uL (ref 1.7–7.7)
Neutrophils Relative %: 69 %
Platelets: 237 10*3/uL (ref 150–400)
RBC: 4.48 MIL/uL (ref 3.87–5.11)
RDW: 13.9 % (ref 11.5–15.5)
WBC: 9.6 10*3/uL (ref 4.0–10.5)
nRBC: 0 % (ref 0.0–0.2)

## 2023-08-30 MED ORDER — POTASSIUM CHLORIDE 20 MEQ PO PACK
20.0000 meq | PACK | Freq: Once | ORAL | Status: AC
Start: 2023-08-30 — End: 2023-08-30
  Administered 2023-08-30: 20 meq via ORAL
  Filled 2023-08-30: qty 1

## 2023-08-30 MED ORDER — SCOPOLAMINE 1 MG/3DAYS TD PT72: 1.0000 | MEDICATED_PATCH | TRANSDERMAL | 3 refills | Status: DC

## 2023-08-30 MED ORDER — PROCHLORPERAZINE MALEATE 5 MG PO TABS: 5.0000 mg | ORAL_TABLET | Freq: Four times a day (QID) | ORAL | 0 refills | Status: DC

## 2023-08-30 MED ORDER — PROCHLORPERAZINE EDISYLATE 10 MG/2ML IJ SOLN
10.0000 mg | INTRAMUSCULAR | Status: AC
  Administered 2023-08-30: 10 mg via INTRAVENOUS
  Filled 2023-08-30: qty 2

## 2023-08-30 NOTE — MAU Provider Note (Signed)
 History     CSN: [id number]  Arrival date and time: 08/29/23 2239   Event Date/Time   First Provider Initiated Contact with Patient    Chief Complaint  Patient presents with   Nausea   Emesis   Constipation    HPI  Miranda Morrow is a 38 y.o. [id number] at [redacted]w[redacted]d who presents to the MAU for nausea and vomiting. Pt has been struggling with hyperemesis throughout pregnancy. She reports ongoing emesis today, last ate yesterday, but has been vomiting even despite not eating. Feels dehydrated. No fevers, chills, abd pain. Endorses constipation. No VB, pelvic pain.  Past Medical History:  Diagnosis Date   Abnormal Pap smear of cervix 2006   underwent colposcopy followed by    Functional ovarian cysts     Past Surgical History:  Procedure Laterality Date   COLPOSCOPY  2006   CRYOTHERAPY  2006    Family History  Problem Relation Age of Onset   Hypertension Mother    Heart disease Mother        also has pacemaker/AICD   Congestive Heart Failure Mother     Social History   Tobacco Use   Smoking status: Former    Types: Cigarettes   Smokeless tobacco: Never  Substance Use Topics   Alcohol use: Not Currently    Alcohol/week: 0.0 standard drinks of alcohol    Comment: occ   Drug use: No    Allergies: No Known Allergies  Medications Prior to Admission  Medication Sig Dispense Refill Last Dose/Taking   doxylamine, Sleep, (UNISOM) 25 MG tablet Take 1 tablet (25 mg total) by mouth at bedtime. 90 tablet 0    ondansetron (ZOFRAN-ODT) 4 MG disintegrating tablet Take 1 tablet (4 mg total) by mouth every 8 (eight) hours as needed for nausea or vomiting. 90 tablet 0    promethazine (PHENERGAN) 12.5 MG tablet Take 1 tablet (12.5 mg total) by mouth every 6 (six) hours as needed for nausea or vomiting. 90 tablet 0    pyridOXINE (VITAMIN B6) 50 MG tablet Take 1 tablet (50 mg total) by mouth daily. 90 tablet 0     ROS reviewed and pertinent positives and negatives as documented in  HPI.  Physical Exam   Blood pressure 116/60, pulse (!) 104, temperature 98.4 F (36.9 C), temperature source Oral, resp. rate 18, height 4\' 11"  (1.499 m), weight 54.6 kg, last menstrual period 06/05/2023, SpO2 100%, currently breastfeeding.  Physical Exam Constitutional:      General: She is not in acute distress.    Appearance: Normal appearance. She is not ill-appearing.     Comments: Dry mucous membranes  HENT:     Head: Normocephalic and atraumatic.  Cardiovascular:     Rate and Rhythm: Normal rate.  Pulmonary:     Effort: Pulmonary effort is normal.     Breath sounds: Normal breath sounds.  Abdominal:     Palpations: Abdomen is soft.     Tenderness: There is no abdominal tenderness. There is no guarding.  Musculoskeletal:        General: Normal range of motion.  Skin:    General: Skin is warm and dry.     Findings: No rash.  Neurological:     General: No focal deficit present.     Mental Status: She is alert and oriented to person, place, and time.     MAU Course  Procedures  MDM 38 y.o. [id number] at [redacted]w[redacted]d presenting for nausea and vomiting in setting of hyperemesis.  She is hypovolemic on exam. She was given 1L LR, IV Compazine, and Scopolamine patch w relief of sxs. Her labs were notable for mild hypokalemia, which was repleted. She was discharged w Compazine and instructed to take it scheduled and continue to use Scopolamine. Discussed having smaller, more frequent meals to help prevent emesis. Pt felt better after above measures and was discharged home.   Assessment and Plan  Hyperemesis affecting pregnancy, antepartum  Nausea and vomiting in pregnancy Given IV fluids, Compazine, Scopolamine w relief Rx for Compazine and Scopolamine given Rec small, frequent meals Rec watching weight gain closely   Sundra Aland, MD OB Fellow, Faculty Practice [facility], [facility]  08/30/2023, 4:22 AM

## 2023-09-12 ENCOUNTER — Telehealth: Payer: Medicaid Other

## 2023-09-12 NOTE — Telephone Encounter (Signed)
 Called Pt to see if she would be able to do New OB Intake this am, Pt requested to be rescheduled. Will send to Admin.

## 2023-09-19 ENCOUNTER — Encounter: Payer: Medicaid Other | Admitting: Family Medicine

## 2023-10-01 ENCOUNTER — Other Ambulatory Visit: Payer: Self-pay

## 2023-10-01 ENCOUNTER — Inpatient Hospital Stay (HOSPITAL_COMMUNITY)
Admission: AD | Admit: 2023-10-01 | Discharge: 2023-10-01 | Disposition: A | Discharge status: Home / Self Care | Attending: Obstetrics and Gynecology | Admitting: Obstetrics and Gynecology

## 2023-10-01 DIAGNOSIS — J309 Allergic rhinitis, unspecified: Secondary | ICD-10-CM | POA: Insufficient documentation

## 2023-10-01 DIAGNOSIS — Z9109 Other allergy status, other than to drugs and biological substances: Secondary | ICD-10-CM

## 2023-10-01 DIAGNOSIS — R0981 Nasal congestion: Secondary | ICD-10-CM | POA: Diagnosis not present

## 2023-10-01 DIAGNOSIS — O26892 Other specified pregnancy related conditions, second trimester: Secondary | ICD-10-CM

## 2023-10-01 DIAGNOSIS — O99512 Diseases of the respiratory system complicating pregnancy, second trimester: Secondary | ICD-10-CM | POA: Insufficient documentation

## 2023-10-01 DIAGNOSIS — Z3A16 16 weeks gestation of pregnancy: Secondary | ICD-10-CM | POA: Diagnosis not present

## 2023-10-01 LAB — CBC
HCT: 34.1 % — ABNORMAL LOW (ref 36.0–46.0)
Hemoglobin: 11.2 g/dL — ABNORMAL LOW (ref 12.0–15.0)
MCH: 25.5 pg — ABNORMAL LOW (ref 26.0–34.0)
MCHC: 32.8 g/dL (ref 30.0–36.0)
MCV: 77.5 fL — ABNORMAL LOW (ref 80.0–100.0)
Platelets: 190 10*3/uL (ref 150–400)
RBC: 4.4 MIL/uL (ref 3.87–5.11)
RDW: 13.2 % (ref 11.5–15.5)
WBC: 10.6 10*3/uL — ABNORMAL HIGH (ref 4.0–10.5)
nRBC: 0 % (ref 0.0–0.2)

## 2023-10-01 LAB — COMPREHENSIVE METABOLIC PANEL WITH GFR
ALT: 10 U/L (ref 0–44)
AST: 14 U/L — ABNORMAL LOW (ref 15–41)
Albumin: 3.2 g/dL — ABNORMAL LOW (ref 3.5–5.0)
Alkaline Phosphatase: 32 U/L — ABNORMAL LOW (ref 38–126)
Anion gap: 8 (ref 5–15)
BUN: <5 mg/dL — ABNORMAL LOW (ref 6–20)
CO2: 21 mmol/L — ABNORMAL LOW (ref 22–32)
Calcium: 8.8 mg/dL — ABNORMAL LOW (ref 8.9–10.3)
Chloride: 105 mmol/L (ref 98–111)
Creatinine, Ser: 0.44 mg/dL (ref 0.44–1.00)
GFR, Estimated: >60 mL/min (ref >60)
Glucose, Bld: 93 mg/dL (ref 70–99)
Potassium: 3.6 mmol/L (ref 3.5–5.1)
Sodium: 134 mmol/L — ABNORMAL LOW (ref 135–145)
Total Bilirubin: 0.3 mg/dL (ref 0.0–1.2)
Total Protein: 6.5 g/dL (ref 6.5–8.1)

## 2023-10-01 LAB — RESP PANEL BY RT-PCR (RSV, FLU A&B, COVID)  RVPGX2
Influenza A by PCR: NEGATIVE
Influenza B by PCR: NEGATIVE
Resp Syncytial Virus by PCR: NEGATIVE
SARS Coronavirus 2 by RT PCR: NEGATIVE

## 2023-10-01 MED ORDER — FLUTICASONE PROPIONATE 50 MCG/ACT NA SUSP
2.0000 | Freq: Once | NASAL | Status: AC
  Administered 2023-10-01: 2 via NASAL
  Filled 2023-10-01: qty 16

## 2023-10-01 MED ORDER — LORATADINE 10 MG PO TABS: 10.0000 mg | ORAL_TABLET | Freq: Every day | ORAL | 0 refills | Status: DC

## 2023-10-01 MED ORDER — LORATADINE 10 MG PO TABS
10.0000 mg | ORAL_TABLET | Freq: Once | ORAL | Status: AC
  Administered 2023-10-01: 10 mg via ORAL
  Filled 2023-10-01: qty 1

## 2023-10-01 MED ORDER — FLUTICASONE PROPIONATE 50 MCG/ACT NA SUSP: 1.0000 | Freq: Every day | NASAL | 0 refills | Status: AC

## 2023-10-01 MED ORDER — SALINE SPRAY 0.65 % NA SOLN: 2.0000 | NASAL | 0 refills | Status: DC | PRN

## 2023-10-01 NOTE — MAU Note (Signed)
 Miranda Morrow is a 38 y.o. at [redacted]w[redacted]d here in MAU reporting: she had a crust in both eyes when she woke up this morning, states she's congested in both nostrils and can't breathe through her nose and had blood in vomit x2. Denies VB or LOF.  LMP: 06/05/2023 Onset of complaint: 3 days Pain score: 0 Vitals:   10/01/23 1331  BP: 119/77  Pulse: 84  Resp: 18  Temp: 98.3 F (36.8 C)  SpO2: 100%     FHT: 147 bpm  Lab orders placed from triage: None

## 2023-10-01 NOTE — Discharge Instructions (Signed)
Safe Medications in Pregnancy   Acne:  Benzoyl Peroxide  Salicylic Acid   Backache/Headache:  Tylenol: 2 regular strength every 4 hours OR               2 Extra strength every 6 hours   Colds/Coughs/Allergies:  Benadryl (alcohol free) 25 mg every 6 hours as needed  Breath right strips  Claritin  Cepacol throat lozenges  Chloraseptic throat spray  Cold-Eeze- up to three times per day  Cough drops, alcohol free  Flonase (by prescription only)  Guaifenesin  Mucinex  Robitussin DM (plain only, alcohol free)  Saline nasal spray/drops  Sudafed (pseudoephedrine) & Actifed * use only after [redacted] weeks gestation and if you do not have high blood pressure  Tylenol  Vicks Vaporub  Zinc lozenges  Zyrtec   Constipation:  Colace  Ducolax suppositories  Fleet enema  Glycerin suppositories  Metamucil  Milk of magnesia  Miralax  Senokot  Smooth move tea   Diarrhea:  Kaopectate  Imodium A-D   *NO pepto Bismol   Hemorrhoids:  Anusol  Anusol HC  Preparation H  Tucks   Indigestion:  Tums  Maalox  Mylanta  Zantac  Pepcid   Insomnia:  Benadryl (alcohol free) 25mg every 6 hours as needed  Tylenol PM  Unisom, no Gelcaps   Leg Cramps:  Tums  MagGel   Nausea/Vomiting:  Bonine  Dramamine  Emetrol  Ginger extract  Sea bands  Meclizine  Nausea medication to take during pregnancy:  Unisom (doxylamine succinate 25 mg tablets) Take one tablet daily at bedtime. If symptoms are not adequately controlled, the dose can be increased to a maximum recommended dose of two tablets daily (1/2 tablet in the morning, 1/2 tablet mid-afternoon and one at bedtime).  Vitamin B6 100mg tablets. Take one tablet twice a day (up to 200 mg per day).   Skin Rashes:  Aveeno products  Benadryl cream or 25mg every 6 hours as needed  Calamine Lotion  1% cortisone cream   Yeast infection:  Gyne-lotrimin 7  Monistat 7    **If taking multiple medications, please check labels to avoid  duplicating the same active ingredients  **take medication as directed on the label  ** Do not exceed 4000 mg of tylenol in 24 hours  **Do not take medications that contain aspirin or ibuprofen           Commonly Asked Questions During Pregnancy  How Will I Feel When I'm Pregnant? Pregnancy symptoms in the first trimester of pregnancy may not appear until the middle or end of the second month. Hormonal changes will cause tenderness in your breasts, and you may begin to feel more tired than usual. Food cravings, an increase in the need to urinate, and morning sickness may all be more noticeable.  Pregnancy symptoms in the second trimester are more prominent. You may start to feel the baby move and become more active. Dental issues, nasal/sinus problems, and skin irritations can begin to appear. Heartburn, leg cramps, dizziness, and a vaginal discharge are also common. Every woman is different when it comes to the symptoms they experience, and some may not experience any at all. Pregnancy symptoms in the third trimester can include increased frequency in urination, leg cramps, constipation, ligament pain in the abdomen, and weight gain. Back pain and Braxton Hicks contractions will become increasingly more common.  Why is nutrition during pregnancy important? Eating well is one of the best things you can do during pregnancy. Good nutrition helps you   handle the extra demands on your body as your pregnancy progresses. The goal is to balance getting enough nutrients to support the growth of your fetus and maintaining a healthy weight.  How much water should I drink during pregnancy? During pregnancy you should drink 8 to 12 cups (64 to 96 ounces) of water every day. Water has many benefits. It aids digestion and helps form the amniotic fluid around the fetus. Water also helps nutrients circulate in the body and helps waste leave the body.  What can I do to help with nausea? Eat dry toast or crackers  in the morning before you get out of bed to avoid moving around on an empty stomach. Eat five or six "mini meals" a day to ensure that your stomach is never empty. Eat frequent bites of foods like nuts, fruits, or crackers.  What can help with constipation during pregnancy? Constipation is common near the end of pregnancy. Eating more foods with fiber can help fight constipation. Fiber is found in fruits, vegetables, whole grains, beans, nuts, and seeds. You should aim for about 25 grams of fiber in your diet each day. Drink a lot of water as you increase your fiber intake.  How much coffee can I drink while I'm pregnant? Research suggests that moderate caffeine consumption (less than 200 milligrams per day) does not cause miscarriage or preterm birth. That's the amount in one 12-ounce cup of coffee. Remember that caffeine also is found in tea, chocolate, energy drinks, and soft drinks. Caffeine can interfere with sleep and contribute to nausea and light-headedness. Caffeine also can increase urination and lead to dehydration.  What can I do to prevent or ease back pain during pregnancy? There are several things you can do to prevent or ease back pain. For example, wear supportive clothing and shoes. Pay attention to your position when sitting, sleeping, and lifting things. If you need to stand for a long time, rest one foot on a stool or a box to take the strain off your back. You also can use heat or cold to soothe sore muscles.  Is it safe to exercise during pregnancy? If you are healthy and your pregnancy is normal, it is safe to continue or start regular physical activity. Physical activity does not increase your risk of miscarriage, low birth weight, or early delivery. It's still important to discuss exercise with your ob-gyn provider during your early prenatal visits.   What are the benefits of exercise during pregnancy? Regular exercise during pregnancy benefits you and your fetus in these  key ways: Reduces back pain Eases constipation May decrease your risk of gestational diabetes, preeclampsia, and cesarean birth Promotes healthy weight gain during pregnancy Improves your overall fitness and strengthens your heart and blood vessels Helps you to lose the baby weight after your baby is born  Is it safe to dye my hair during pregnancy? Yes, it's safe. Only a small amount of chemicals from hair dye is absorbed through the scalp.  Is it safe to keep a cat during pregnancy? Yes, you can keep your cat. You may have heard that cat feces can carry the infection toxoplasmosis. This infection is only found in cats who go outdoors and hunt prey, such as mice and other rodents. If you do have a cat who goes outdoors or eats prey, have someone else take over daily cleaning the litter box. This will keep you away from any cat feces. If you have an indoor cat who only eats cat   food and doesn't have contact with outside animals, your risk of toxoplasmosis is very low.  What substances should I avoid during pregnancy? During pregnancy, women should not use tobacco, alcohol, marijuana, illegal drugs, or prescription medications for nonmedical reasons. Avoiding these substances and getting regular prenatal care are important to having a healthy pregnancy and a healthy baby.   What foods to I need to avoid in pregnancy? To help prevent listeriosis, avoid eating the following foods while you are pregnant: Unpasteurized milk and foods made with unpasteurized milk, including soft cheeses Hot dogs and luncheon meats, unless they are heated until steaming hot just before serving Unwashed raw produce such as fruits and vegetables  Avoid all raw and undercooked seafood, eggs, meat, and poultry while you are pregnant. Do not eat sushi made with raw fish (cooked sushi is safe). Cooking and pasteurization are the only ways to kill Listeria.  Limit your exposure to mercury by not eating bigeye tuna, king  mackerel, marlin, orange roughy, shark, swordfish, or tilefish. Limit eating white (albacore) tuna to 6 ounces a week. You do not have to avoid all fish during pregnancy. In fact, fish and shellfish are nutritious foods with vital nutrients for a pregnant woman and her fetus. Be sure to eat at least 8-12 ounces of low-mercury fish and shellfish per week.  Is travel safe to during pregnancy? In most cases, pregnant women can travel safely until close to their due dates. But travel may not be recommended for women who have pregnancy complications. If you are planning a trip, talk with your (ob-gyn) provider. And no matter how you choose to travel, think ahead about your comfort and safety.  Can I use a sauna or hot tub early in pregnancy? It's best not to. Your core body temperature rises when you use saunas and hot tubs. This rise in temperature can be harmful for your fetus.  Can I get a massage while pregnant? Yes. Massage is a good way to relax and improve circulation. The best position for a massage while you're pregnant is lying on your side, rather than facedown. Some massage tables have a cut-out for the belly, allowing you to lie facedown comfortably. Tell your massage therapist that you're pregnant if you're not showing yet. Many health spas offer special prenatal massages done by therapists who are trained to work on pregnant women.  Is Having Dental Work While Pregnant Safe? Pregnancy and dental work questions are common for expecting moms. Preventive dental cleanings and annual exams during pregnancy are not only safe but are recommended. The rise in hormone levels during pregnancy causes the gums to swell, bleed, and trap food causing increased irritation to your gums. Preventive dental work while pregnant is essential to avoid oral infections such as gum disease, which has been linked to preterm birth. The American Dental Association (ADA) recommends pregnant women eat a balanced diet,  brush their teeth thoroughly with ADA-approved fluoride toothpaste twice a day, and floss daily. Have preventive exams and cleanings during your pregnancy. Let your dentist know you are pregnant. Postpone non-emergency dental work until the second trimester or after delivery, if possible. Elective procedures should be postponed until after the delivery.  

## 2023-10-01 NOTE — MAU Provider Note (Signed)
 S [name] is a 38 y.o. [phone] patient who presents to MAU today with complaint of severe nasal congestion that started 3 days ago and difficulty breathing at night d/t her nasal congestion. She states  she just bought a humidifier for her room and is hoping it will help She reports she hasn't used any medication and denies any environmental allergies. Denies any sick contacts, fever, chills. Offers no OB c/o and denies any LOF, VB, CTX.   Review of Systems  Constitutional:  Negative for chills and fever.  HENT:  Positive for congestion.   Respiratory:  Negative for cough and wheezing.   Cardiovascular:  Negative for chest pain and palpitations.  Gastrointestinal: Negative.   Genitourinary: Negative.   Neurological: Negative.   All other systems reviewed and are negative.    O BP 119/77 (BP Location: Right Arm)   Pulse 84   Temp 98.3 F (36.8 C) (Oral)   Resp 18   Ht 4\' 11"  (1.499 m)   Wt 54.7 kg   LMP 06/05/2023   SpO2 100%   BMI 24.34 kg/m  Physical Exam Vitals and nursing note reviewed.  Constitutional:      General: She is not in acute distress.    Appearance: Normal appearance. She is not ill-appearing.  HENT:     Head: Normocephalic.     Nose: Congestion and rhinorrhea present.  Cardiovascular:     Rate and Rhythm: Normal rate and regular rhythm.  Pulmonary:     Effort: Pulmonary effort is normal.     Breath sounds: Normal breath sounds.  Abdominal:     Palpations: Abdomen is soft.  Musculoskeletal:        General: Normal range of motion.     Cervical back: Normal range of motion.  Skin:    General: Skin is warm.  Neurological:     Mental Status: She is oriented to person, place, and time.  Psychiatric:        Behavior: Behavior normal.    FHR at 147 via doppler   MDM  HIGH  - Labs: unremarkable - Respiratory panel: Negative  - Likely allergic rhinitis   I have reviewed the patient chart and performed the physical exam . I have  ordered & interpreted the lab results and reviewed them with the patient  Medications ordered as stated below.  A/P as described below.  Counseling and education provided and patient agreeable  with plan as described below. Verbalized understanding.     Orders Placed This Encounter  Procedures   Resp panel by RT-PCR (RSV, Flu A&B, Covid) Anterior Nasal Swab    Standing Status:   Standing    Number of Occurrences:   1   CBC    Standing Status:   Standing    Number of Occurrences:   1   Comprehensive metabolic panel    Standing Status:   Standing    Number of Occurrences:   1   Airborne and Contact precautions    Standing Status:   Standing    Number of Occurrences:   1   Discharge patient Discharge disposition: 01-Home or Self Care; Discharge patient date: 10/01/2023    Standing Status:   Standing    Number of Occurrences:   1    Discharge disposition:   01-Home or Self Care [1]    Discharge patient date:   10/01/2023   Discharge patient Discharge disposition: 01-Home or Self Care; Discharge patient date: 10/01/2023  Standing Status:   Standing    Number of Occurrences:   1    Discharge disposition:   01-Home or Self Care [1]    Discharge patient date:   10/01/2023    Meds ordered this encounter  Medications   fluticasone (FLONASE) 50 MCG/ACT nasal spray 2 spray   loratadine (CLARITIN) tablet 10 mg   fluticasone (FLONASE) 50 MCG/ACT nasal spray    Sig: Place 1 spray into both nostrils daily.    Dispense:  9.9 mL    Refill:  0    Supervising Provider:   Reva Bores [[id number]]   loratadine (CLARITIN) 10 MG tablet    Sig: Take 1 tablet (10 mg total) by mouth daily.    Dispense:  30 tablet    Refill:  0    Supervising Provider:   Reva Bores [[id number]]   sodium chloride (OCEAN) 0.65 % SOLN nasal spray    Sig: Place 2 sprays into both nostrils as needed for congestion.    Dispense:  50 mL    Refill:  0    Supervising Provider:   Reva Bores [[id number]]      Results for orders  placed or performed during the hospital encounter of 10/01/23 (from the past 24 hours)  Resp panel by RT-PCR (RSV, Flu A&B, Covid) Anterior Nasal Swab     Status: None   Collection Time: 10/01/23  2:24 PM   Specimen: Anterior Nasal Swab  Result Value Ref Range   SARS Coronavirus 2 by RT PCR NEGATIVE NEGATIVE   Influenza A by PCR NEGATIVE NEGATIVE   Influenza B by PCR NEGATIVE NEGATIVE   Resp Syncytial Virus by PCR NEGATIVE NEGATIVE  CBC     Status: Abnormal   Collection Time: 10/01/23  3:19 PM  Result Value Ref Range   WBC 10.6 (H) 4.0 - 10.5 K/uL   RBC 4.40 3.87 - 5.11 MIL/uL   Hemoglobin 11.2 (L) 12.0 - 15.0 g/dL   HCT 40.9 (L) 81.1 - 91.4 %   MCV 77.5 (L) 80.0 - 100.0 fL   MCH 25.5 (L) 26.0 - 34.0 pg   MCHC 32.8 30.0 - 36.0 g/dL   RDW 78.2 95.6 - 21.3 %   Platelets 190 150 - 400 K/uL   nRBC 0.0 0.0 - 0.2 %  Comprehensive metabolic panel     Status: Abnormal   Collection Time: 10/01/23  3:19 PM  Result Value Ref Range   Sodium 134 (L) 135 - 145 mmol/L   Potassium 3.6 3.5 - 5.1 mmol/L   Chloride 105 98 - 111 mmol/L   CO2 21 (L) 22 - 32 mmol/L   Glucose, Bld 93 70 - 99 mg/dL   BUN <5 (L) 6 - 20 mg/dL   Creatinine, Ser 0.86 0.44 - 1.00 mg/dL   Calcium 8.8 (L) 8.9 - 10.3 mg/dL   Total Protein 6.5 6.5 - 8.1 g/dL   Albumin 3.2 (L) 3.5 - 5.0 g/dL   AST 14 (L) 15 - 41 U/L   ALT 10 0 - 44 U/L   Alkaline Phosphatase 32 (L) 38 - 126 U/L   Total Bilirubin 0.3 0.0 - 1.2 mg/dL   GFR, Estimated >57 >84 mL/min   Anion gap 8 5 - 15     ASSESSMENT Medical screening exam complete Allergic Rhinitis in pregnancy  [redacted] weeks GA  PLAN  F/u with OB as scheduled  Future Appointments  Date Time Provider Department Center  10/04/2023 11:15 AM [facility]-NEW OB INTAKE  [facility]  10/08/2023  8:55 AM Leftwich-Kirby, Wilmer Floor, CNM [facility]    Discharge from MAU in stable condition Patient given the option of transfer to [facility] for further evaluation or seek care in outpatient facility of choice   List of options for follow-up given  Warning signs for worsening condition that would warrant emergency follow-up discussed Patient may return to MAU as needed   Colman Cater, NP 10/01/2023 4:14 PM

## 2023-10-04 ENCOUNTER — Telehealth: Payer: Self-pay | Admitting: *Deleted

## 2023-10-04 ENCOUNTER — Telehealth

## 2023-10-04 NOTE — Telephone Encounter (Signed)
 11:20 Patient not connected virtually for virtual New OB Intake appointment. I called patient and left  message I was calling re: virtual appointment she has scheduled and to log on in next few minutes if able, if not to call to reschedule. Nancy Fetter 11:30 Patient still not connected virtually for new ob intake appointment. I called patient and left a message that I was calling again re: virtual appointment and that since we did not connect I need her to call our office to reschedule.  RN will also notify front office. Nancy Fetter

## 2023-10-08 ENCOUNTER — Encounter: Admitting: Advanced Practice Midwife

## 2023-10-20 ENCOUNTER — Encounter (HOSPITAL_COMMUNITY): Payer: Self-pay | Admitting: Obstetrics and Gynecology

## 2023-10-20 ENCOUNTER — Inpatient Hospital Stay (HOSPITAL_COMMUNITY)
Admission: AD | Admit: 2023-10-20 | Discharge: 2023-10-20 | Disposition: A | Discharge status: Home / Self Care | Source: Ambulatory Visit | Attending: Obstetrics and Gynecology | Admitting: Obstetrics and Gynecology

## 2023-10-20 ENCOUNTER — Inpatient Hospital Stay (HOSPITAL_COMMUNITY)

## 2023-10-20 DIAGNOSIS — O09522 Supervision of elderly multigravida, second trimester: Secondary | ICD-10-CM | POA: Diagnosis not present

## 2023-10-20 DIAGNOSIS — R109 Unspecified abdominal pain: Secondary | ICD-10-CM | POA: Insufficient documentation

## 2023-10-20 DIAGNOSIS — O9A212 Injury, poisoning and certain other consequences of external causes complicating pregnancy, second trimester: Secondary | ICD-10-CM | POA: Diagnosis not present

## 2023-10-20 DIAGNOSIS — W108XXA Fall (on) (from) other stairs and steps, initial encounter: Secondary | ICD-10-CM | POA: Insufficient documentation

## 2023-10-20 DIAGNOSIS — W109XXA Fall (on) (from) unspecified stairs and steps, initial encounter: Secondary | ICD-10-CM | POA: Diagnosis not present

## 2023-10-20 DIAGNOSIS — Z3A19 19 weeks gestation of pregnancy: Secondary | ICD-10-CM | POA: Diagnosis not present

## 2023-10-20 LAB — URINALYSIS, ROUTINE W REFLEX MICROSCOPIC
Bilirubin Urine: NEGATIVE
Glucose, UA: NEGATIVE mg/dL
Hgb urine dipstick: NEGATIVE
Ketones, ur: 5 mg/dL — AB
Leukocytes,Ua: NEGATIVE
Nitrite: NEGATIVE
Protein, ur: 30 mg/dL — AB
Specific Gravity, Urine: 1.041 — ABNORMAL HIGH (ref 1.005–1.030)
pH: 5 (ref 5.0–8.0)

## 2023-10-20 LAB — WET PREP, GENITAL
Clue Cells Wet Prep HPF POC: NONE SEEN
Sperm: NONE SEEN
Trich, Wet Prep: NONE SEEN
WBC, Wet Prep HPF POC: <10 (ref <10)
Yeast Wet Prep HPF POC: NONE SEEN

## 2023-10-20 NOTE — MAU Provider Note (Signed)
 Chief Complaint: Fall and Abdominal Pain   Event Date/Time   First Provider Initiated Contact with Patient 10/20/23 0421      SUBJECTIVE HPI: Miranda Morrow is a 38 y.o. [id number] at [redacted]w[redacted]d by LMP c/w 8 week US  who presents to maternity admissions reporting she fell up the stairs at work tonight. She slipped and fell forward, tried to catch herself but failed and hit her abdomen on the stairs.  She denies any known bruising or scrapes.  There is some intermittent abdominal tightening since the fall.   She has not yet started prenatal care in this pregnancy.  HPI  Past Medical History:  Diagnosis Date   Abnormal Pap smear of cervix 2006   underwent colposcopy followed by    Functional ovarian cysts    Past Surgical History:  Procedure Laterality Date   COLPOSCOPY  2006   CRYOTHERAPY  2006   Social History   Socioeconomic History   Marital status: Single    Spouse name: Not on file   Number of children: 4   Years of education: 9   Highest education level: Not on file  Occupational History   Occupation: Conservation officer, nature at [employer]  Tobacco Use   Smoking status: Former    Types: Cigarettes   Smokeless tobacco: Never  Substance and Sexual Activity   Alcohol use: Not Currently    Alcohol/week: 0.0 standard drinks of alcohol    Comment: occ   Drug use: No   Sexual activity: Not Currently    Birth control/protection: None  Other Topics Concern   Not on file  Social History Narrative   Lives at home with her children except for daughter, Gregary Lean with her dad's family   Originally from [place]--came down to [place] when in [employer].   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   Social Drivers of Corporate investment banker Strain: Not on file  Food Insecurity: Not on file  Transportation Needs: Not on file  Physical Activity: Not on file  Stress: Not on file  Social Connections: Not on file  Intimate Partner Violence: Not on file   No current  facility-administered medications on file prior to encounter.   Current Outpatient Medications on File Prior to Encounter  Medication Sig Dispense Refill   scopolamine  (TRANSDERM-SCOP) 1 MG/3DAYS Place 1 patch (1.5 mg total) onto the skin every 3 (three) days. 10 patch 3   doxylamine , Sleep, (UNISOM ) 25 MG tablet Take 1 tablet (25 mg total) by mouth at bedtime. 90 tablet 0   fluticasone  (FLONASE ) 50 MCG/ACT nasal spray Place 1 spray into both nostrils daily. 9.9 mL 0   loratadine  (CLARITIN ) 10 MG tablet Take 1 tablet (10 mg total) by mouth daily. 30 tablet 0   ondansetron  (ZOFRAN -ODT) 4 MG disintegrating tablet Take 1 tablet (4 mg total) by mouth every 8 (eight) hours as needed for nausea or vomiting. 90 tablet 0   prochlorperazine  (COMPAZINE ) 5 MG tablet Take 1 tablet (5 mg total) by mouth every 6 (six) hours. 40 tablet 0   promethazine  (PHENERGAN ) 12.5 MG tablet Take 1 tablet (12.5 mg total) by mouth every 6 (six) hours as needed for nausea or vomiting. 90 tablet 0   pyridOXINE (VITAMIN B6) 50 MG tablet Take 1 tablet (50 mg total) by mouth daily. 90 tablet 0   sodium chloride  (OCEAN) 0.65 % SOLN nasal spray Place 2 sprays into both nostrils as needed for congestion. 50 mL 0   No Known Allergies  ROS:  Review of Systems  Constitutional:  Negative for chills, fatigue and fever.  Respiratory:  Negative for shortness of breath.   Cardiovascular:  Negative for chest pain.  Gastrointestinal:  Positive for abdominal pain.  Genitourinary:  Negative for difficulty urinating, dysuria, flank pain, pelvic pain, vaginal bleeding, vaginal discharge and vaginal pain.  Neurological:  Negative for dizziness and headaches.  Psychiatric/Behavioral: Negative.       I have reviewed patient's Past Medical Hx, Surgical Hx, Family Hx, Social Hx, medications and allergies.   Physical Exam  Patient Vitals for the past 24 hrs:  BP Temp Temp src Pulse Resp SpO2 Height Weight  10/20/23 0350 117/69 98.4 F  (36.9 C) Oral 73 18 100 % -- --  10/20/23 0341 -- -- -- -- -- -- 4\' 11"  (1.499 m) 57.3 kg   Constitutional: Well-developed, well-nourished female in no acute distress.  Cardiovascular: normal rate Respiratory: normal effort GI: Abd soft, non-tender. Pos BS x 4 MS: Extremities nontender, no edema, normal ROM Neurologic: Alert and oriented x 4.  GU: Neg CVAT.  PELVIC EXAM:GCC and wet prep collected  Cervix closed/thick/high  FHT 150 by doppler  LAB RESULTS Results for orders placed or performed during the hospital encounter of 10/20/23 (from the past 24 hours)  Urinalysis, Routine w reflex microscopic -Urine, Clean Catch     Status: Abnormal   Collection Time: 10/20/23  4:01 AM  Result Value Ref Range   Color, Urine YELLOW YELLOW   APPearance HAZY (A) CLEAR   Specific Gravity, Urine 1.041 (H) 1.005 - 1.030   pH 5.0 5.0 - 8.0   Glucose, UA NEGATIVE NEGATIVE mg/dL   Hgb urine dipstick NEGATIVE NEGATIVE   Bilirubin Urine NEGATIVE NEGATIVE   Ketones, ur 5 (A) NEGATIVE mg/dL   Protein, ur 30 (A) NEGATIVE mg/dL   Nitrite NEGATIVE NEGATIVE   Leukocytes,Ua NEGATIVE NEGATIVE   RBC / HPF 0-5 0 - 5 RBC/hpf   WBC, UA 0-5 0 - 5 WBC/hpf   Bacteria, UA RARE (A) NONE SEEN   Squamous Epithelial / HPF 6-10 0 - 5 /HPF   Mucus PRESENT   Wet prep, genital     Status: None   Collection Time: 10/20/23  4:31 AM  Result Value Ref Range   Yeast Wet Prep HPF POC NONE SEEN NONE SEEN   Trich, Wet Prep NONE SEEN NONE SEEN   Clue Cells Wet Prep HPF POC NONE SEEN NONE SEEN   WBC, Wet Prep HPF POC <10 <10   Sperm NONE SEEN        IMAGING No results found.  MAU Management/MDM: Orders Placed This Encounter  Procedures   Wet prep, genital   US  MFM OB LIMITED   Urinalysis, Routine w reflex microscopic -Urine, Clean Catch   Discharge patient Discharge disposition: 01-Home or Self Care; Discharge patient date: 10/20/2023    No orders of the defined types were placed in this encounter.   FHT wnl  by doppler, cervix closed with no evidence of PTL.  OB Limited US  did not identify previa or abruption.  Pt to start prenatal care as planned.  Offered letter for work but pt reports her work will not accept letter.  I recommend resting x 1-2 days and going back to work 10/22/23.   Offered Flexeril , pt declined. Rest/ice/heat/warm bath/increase PO fluids/Tylenol /pregnancy support belt for pain. Keep scheduled new OB appt at [facility]. Return to MAU as needed for emergencies.   ASSESSMENT 1. Traumatic injury during pregnancy in second  trimester   2. Fall (on) (from) other stairs and steps, initial encounter   3. [redacted] weeks gestation of pregnancy     PLAN Discharge home Allergies as of 10/20/2023   No Known Allergies      Medication List     TAKE these medications    doxylamine  (Sleep) 25 MG tablet Commonly known as: UNISOM  Take 1 tablet (25 mg total) by mouth at bedtime.   fluticasone  50 MCG/ACT nasal spray Commonly known as: FLONASE  Place 1 spray into both nostrils daily.   loratadine  10 MG tablet Commonly known as: CLARITIN  Take 1 tablet (10 mg total) by mouth daily.   ondansetron  4 MG disintegrating tablet Commonly known as: ZOFRAN -ODT Take 1 tablet (4 mg total) by mouth every 8 (eight) hours as needed for nausea or vomiting.   prochlorperazine  5 MG tablet Commonly known as: COMPAZINE  Take 1 tablet (5 mg total) by mouth every 6 (six) hours.   promethazine  12.5 MG tablet Commonly known as: PHENERGAN  Take 1 tablet (12.5 mg total) by mouth every 6 (six) hours as needed for nausea or vomiting.   pyridOXINE 50 MG tablet Commonly known as: VITAMIN B6 Take 1 tablet (50 mg total) by mouth daily.   scopolamine  1 MG/3DAYS Commonly known as: TRANSDERM-SCOP Place 1 patch (1.5 mg total) onto the skin every 3 (three) days.   sodium chloride  0.65 % Soln nasal spray Commonly known as: OCEAN Place 2 sprays into both nostrils as needed for congestion.        Follow-up Information      [facility] Follow up.   Specialty: Obstetrics and Gynecology Why: As scheduled Contact information: [address] [place]  [address] [phone]        [facility] Follow up.   Specialty: Obstetrics and Gynecology Why: As needed for emergencies Contact information: [address] [place]  [address] [phone]                Arlester Bence Certified Nurse-Midwife 10/20/2023  5:08 AM

## 2023-10-20 NOTE — MAU Note (Signed)
..  Miranda Morrow is a 38 y.o. at [redacted]w[redacted]d here in MAU reporting: fell going up the stairs at work around [employer]. Reports she hit her abdomen and knee on the stairs.  Abdominal pain 6/10, contraction like.  Knee 6/10, feels like it is going to bruise.  Denies vaginal bleeding, leaking of fluid.   Pain score: 6/10 Vitals:   10/20/23 0350  BP: 117/69  Pulse: 73  Resp: 18  Temp: 98.4 F (36.9 C)  SpO2: 100%     FHT:150 Lab orders placed from triage:  UA

## 2023-10-22 DIAGNOSIS — O09529 Supervision of elderly multigravida, unspecified trimester: Secondary | ICD-10-CM | POA: Insufficient documentation

## 2023-10-22 LAB — GC/CHLAMYDIA PROBE AMP (~~LOC~~) NOT AT ARMC
Chlamydia: NEGATIVE
Comment: NEGATIVE
Comment: NORMAL
Neisseria Gonorrhea: NEGATIVE

## 2023-10-31 LAB — OB RESULTS CONSOLE ANTIBODY SCREEN: Antibody Screen: NEGATIVE

## 2023-10-31 LAB — OB RESULTS CONSOLE HIV ANTIBODY (ROUTINE TESTING): HIV: NONREACTIVE

## 2023-10-31 LAB — OB RESULTS CONSOLE HGB/HCT, BLOOD: HCT: 33 (ref 29–41)

## 2023-10-31 LAB — OB RESULTS CONSOLE ABO/RH: RH Type: POSITIVE

## 2023-10-31 LAB — OB RESULTS CONSOLE RUBELLA ANTIBODY, IGM: Rubella: IMMUNE

## 2023-10-31 LAB — OB RESULTS CONSOLE PLATELET COUNT: Platelets: 223

## 2023-10-31 LAB — OB RESULTS CONSOLE GC/CHLAMYDIA
Chlamydia: NEGATIVE
Neisseria Gonorrhea: NEGATIVE

## 2023-10-31 LAB — OB RESULTS CONSOLE HEPATITIS B SURFACE ANTIGEN: Hepatitis B Surface Ag: NEGATIVE

## 2023-10-31 LAB — HEPATITIS C ANTIBODY: HCV Ab: NEGATIVE

## 2023-11-04 DIAGNOSIS — O99019 Anemia complicating pregnancy, unspecified trimester: Secondary | ICD-10-CM | POA: Insufficient documentation

## 2023-12-10 ENCOUNTER — Inpatient Hospital Stay (HOSPITAL_COMMUNITY)
Admission: AD | Admit: 2023-12-10 | Discharge: 2023-12-11 | Disposition: A | Discharge status: Home / Self Care | Attending: Obstetrics and Gynecology | Admitting: Obstetrics and Gynecology

## 2023-12-10 DIAGNOSIS — X500XXA Overexertion from strenuous movement or load, initial encounter: Secondary | ICD-10-CM | POA: Insufficient documentation

## 2023-12-10 DIAGNOSIS — O26892 Other specified pregnancy related conditions, second trimester: Secondary | ICD-10-CM | POA: Insufficient documentation

## 2023-12-10 DIAGNOSIS — B3731 Acute candidiasis of vulva and vagina: Secondary | ICD-10-CM | POA: Insufficient documentation

## 2023-12-10 DIAGNOSIS — M545 Low back pain, unspecified: Secondary | ICD-10-CM | POA: Insufficient documentation

## 2023-12-10 DIAGNOSIS — R102 Pelvic and perineal pain: Secondary | ICD-10-CM | POA: Insufficient documentation

## 2023-12-10 DIAGNOSIS — O23592 Infection of other part of genital tract in pregnancy, second trimester: Secondary | ICD-10-CM | POA: Insufficient documentation

## 2023-12-10 DIAGNOSIS — N949 Unspecified condition associated with female genital organs and menstrual cycle: Secondary | ICD-10-CM

## 2023-12-10 DIAGNOSIS — O98812 Other maternal infectious and parasitic diseases complicating pregnancy, second trimester: Secondary | ICD-10-CM | POA: Insufficient documentation

## 2023-12-10 DIAGNOSIS — Z3A27 27 weeks gestation of pregnancy: Secondary | ICD-10-CM | POA: Insufficient documentation

## 2023-12-11 ENCOUNTER — Encounter (HOSPITAL_COMMUNITY): Payer: Self-pay | Admitting: Obstetrics and Gynecology

## 2023-12-11 DIAGNOSIS — O26892 Other specified pregnancy related conditions, second trimester: Secondary | ICD-10-CM

## 2023-12-11 DIAGNOSIS — B3731 Acute candidiasis of vulva and vagina: Secondary | ICD-10-CM

## 2023-12-11 DIAGNOSIS — Z3A27 27 weeks gestation of pregnancy: Secondary | ICD-10-CM | POA: Diagnosis not present

## 2023-12-11 DIAGNOSIS — O98812 Other maternal infectious and parasitic diseases complicating pregnancy, second trimester: Secondary | ICD-10-CM | POA: Diagnosis not present

## 2023-12-11 DIAGNOSIS — O23592 Infection of other part of genital tract in pregnancy, second trimester: Secondary | ICD-10-CM | POA: Diagnosis not present

## 2023-12-11 LAB — WET PREP, GENITAL
Clue Cells Wet Prep HPF POC: NONE SEEN
Sperm: NONE SEEN
Trich, Wet Prep: NONE SEEN
WBC, Wet Prep HPF POC: <10 (ref <10)
Yeast Wet Prep HPF POC: NONE SEEN

## 2023-12-11 LAB — URINALYSIS, ROUTINE W REFLEX MICROSCOPIC
Bilirubin Urine: NEGATIVE
Glucose, UA: NEGATIVE mg/dL
Hgb urine dipstick: NEGATIVE
Ketones, ur: NEGATIVE mg/dL
Leukocytes,Ua: NEGATIVE
Nitrite: NEGATIVE
Protein, ur: NEGATIVE mg/dL
Specific Gravity, Urine: 1.014 (ref 1.005–1.030)
pH: 6 (ref 5.0–8.0)

## 2023-12-11 LAB — GC/CHLAMYDIA PROBE AMP (~~LOC~~) NOT AT ARMC
Chlamydia: NEGATIVE
Comment: NEGATIVE
Comment: NORMAL
Neisseria Gonorrhea: NEGATIVE

## 2023-12-11 MED ORDER — TERCONAZOLE 0.4 % VA CREA: 1.0000 | TOPICAL_CREAM | Freq: Every day | VAGINAL | 0 refills | Status: DC

## 2023-12-11 MED ORDER — CYCLOBENZAPRINE HCL 5 MG PO TABS: 5.0000 mg | ORAL_TABLET | Freq: Three times a day (TID) | ORAL | 0 refills | Status: DC | PRN

## 2023-12-11 NOTE — MAU Provider Note (Signed)
 History     CSN: [id number]  Arrival date and time: 12/10/23 2359   Event Date/Time   First Provider Initiated Contact with Patient    Chief Complaint  Patient presents with   Miranda Morrow    HPI  Miranda Morrow is a 38 y.o. [id number] at [redacted]w[redacted]d who presents to the MAU for Miranda and vaginal Morrow. Works at [employer], was at work earlier today when she lifted something heavy (wasn't sure what it was) and then started having low back and vaginal Morrow/pressure. Morrow feels intermittent and sharp. Had not tried any meds for Morrow. No injury/trauma. No VB, LOF, ctx. Normal FM. Denies abnormal vaginal discharge or odor. Denies urinary sxs.  Past Medical History:  Diagnosis Date   Abnormal Pap smear of cervix 2006   underwent colposcopy followed by    Functional ovarian cysts     Past Surgical History:  Procedure Laterality Date   COLPOSCOPY  2006   CRYOTHERAPY  2006    Family History  Problem Relation Age of Onset   Hypertension Mother    Heart disease Mother        also has pacemaker/AICD   Congestive Heart Failure Mother     Social History   Tobacco Use   Smoking status: Former    Types: Cigarettes   Smokeless tobacco: Never  Substance Use Topics   Alcohol use: Not Currently    Alcohol/week: 0.0 standard drinks of alcohol    Comment: occ   Drug use: No    Allergies: No Known Allergies  Medications Prior to Admission  Medication Sig Dispense Refill Last Dose/Taking   acetaminophen  (TYLENOL ) 325 MG tablet Take 650 mg by mouth every 6 (six) hours as needed.   12/10/2023   doxylamine , Sleep, (UNISOM ) 25 MG tablet Take 1 tablet (25 mg total) by mouth at bedtime. 90 tablet 0 More than a month   fluticasone  (FLONASE ) 50 MCG/ACT nasal spray Place 1 spray into both nostrils daily. 9.9 mL 0 More than a month   loratadine  (CLARITIN ) 10 MG tablet Take 1 tablet (10 mg total) by mouth daily. 30 tablet 0 More than a month   ondansetron  (ZOFRAN -ODT) 4 MG disintegrating tablet Take  1 tablet (4 mg total) by mouth every 8 (eight) hours as needed for nausea or vomiting. 90 tablet 0 More than a month   prochlorperazine  (COMPAZINE ) 5 MG tablet Take 1 tablet (5 mg total) by mouth every 6 (six) hours. 40 tablet 0 More than a month   promethazine  (PHENERGAN ) 12.5 MG tablet Take 1 tablet (12.5 mg total) by mouth every 6 (six) hours as needed for nausea or vomiting. 90 tablet 0 More than a month   pyridOXINE (VITAMIN B6) 50 MG tablet Take 1 tablet (50 mg total) by mouth daily. 90 tablet 0 More than a month   scopolamine  (TRANSDERM-SCOP) 1 MG/3DAYS Place 1 patch (1.5 mg total) onto the skin every 3 (three) days. 10 patch 3 More than a month   sodium chloride  (OCEAN) 0.65 % SOLN nasal spray Place 2 sprays into both nostrils as needed for congestion. 50 mL 0 More than a month    ROS reviewed and pertinent positives and negatives as documented in HPI.  Physical Exam   Blood pressure 105/64, pulse 73, temperature 98.3 F (36.8 C), temperature source Oral, resp. rate 12, weight 61.5 kg, last menstrual period 06/05/2023, currently breastfeeding.  Physical Exam Exam conducted with a chaperone present.  Constitutional:      General: She  is not in acute distress.    Appearance: Normal appearance. She is not ill-appearing.  HENT:     Head: Normocephalic and atraumatic.  Cardiovascular:     Rate and Rhythm: Normal rate.  Pulmonary:     Effort: Pulmonary effort is normal.     Breath sounds: Normal breath sounds.  Miranda:     Palpations: Abdomen is soft.     Tenderness: There is no Miranda tenderness. There is no guarding.  Genitourinary:    Comments: Speculum exam - cervix visually closed and thick, thick, chunky vaginal discharge present  Musculoskeletal:        General: Normal range of motion.  Skin:    General: Skin is warm and dry.     Findings: No rash.  Neurological:     General: No focal deficit present.     Mental Status: She is alert and oriented to person, place,  and time.   EFM: 135/mod/-a/-d Toco: quiet  MAU Course  Procedures  MDM 38 y.o. [id number] at [redacted]w[redacted]d presenting for vaginal Morrow and low back Morrow after lifting heavy object at work today. No other inciting incidents, no VB, LOF. Normal FM. Speculum exam notable for closed/thick cervix and discharge c/w yeast infx. Discussed findings with pt. Wet prep and UA were negative. Pt declined Tylenol  or Flexeril  during MAU stay. Rx for Terazol and Flexeril  written. Return precautions reviewed. Stable for d/c.  Assessment and Plan     ICD-10-CM   1. Vulvovaginal candidiasis  B37.31     2. Round ligament Morrow  N94.9     EFM appropriate for gestational age, toco quiet Cervix closed and thick Exam c/w yeast infx Sxs c/w round ligament Morrow Rx for Terazol and Flexeril  sent Return precautions reviewed  Stable for d/c   [name], MD OB Fellow, Faculty Practice [facility], [facility]  12/11/2023, 1:25 AM

## 2023-12-11 NOTE — MAU Note (Signed)
 Pt says she was at work at 11pm- at [employer]- Ex- was picking up boxes  And started feeling pain in her lower abd 6/10 Pain now - same .  Pain comes - goes.  Feels pain in vag - sharp- 6/10. [facility]- [facility] Baby kicked in Triage .  Last sex- 1 week ago

## 2023-12-25 LAB — GLUCOSE TOLERANCE, 1 HOUR: Glucose, 1 Hour GTT: 97

## 2023-12-25 LAB — OB RESULTS CONSOLE HGB/HCT, BLOOD
HCT: 34 (ref 29–41)
Hemoglobin: 10.4

## 2023-12-25 LAB — OB RESULTS CONSOLE PLATELET COUNT: Platelets: 187

## 2023-12-25 LAB — OB RESULTS CONSOLE HIV ANTIBODY (ROUTINE TESTING): HIV: NONREACTIVE

## 2023-12-25 LAB — OB RESULTS CONSOLE RPR: RPR: NONREACTIVE

## 2024-02-01 ENCOUNTER — Other Ambulatory Visit: Payer: Self-pay

## 2024-02-01 ENCOUNTER — Encounter: Payer: Self-pay | Admitting: Obstetrics and Gynecology

## 2024-02-01 ENCOUNTER — Ambulatory Visit: Admitting: Obstetrics and Gynecology

## 2024-02-01 VITALS — BP 117/83 | HR 74 | Wt 138.9 lb

## 2024-02-01 DIAGNOSIS — Z3A34 34 weeks gestation of pregnancy: Secondary | ICD-10-CM | POA: Diagnosis not present

## 2024-02-01 DIAGNOSIS — Z1332 Encounter for screening for maternal depression: Secondary | ICD-10-CM

## 2024-02-01 DIAGNOSIS — Z3493 Encounter for supervision of normal pregnancy, unspecified, third trimester: Secondary | ICD-10-CM | POA: Diagnosis not present

## 2024-02-01 DIAGNOSIS — Z3490 Encounter for supervision of normal pregnancy, unspecified, unspecified trimester: Secondary | ICD-10-CM | POA: Insufficient documentation

## 2024-02-01 NOTE — Progress Notes (Signed)
 INITIAL PRENATAL VISIT  History:  Miranda Morrow is a 38 y.o. [id number] at [redacted]w[redacted]d by LMP being seen today for her first obstetrical visit.  Her obstetrical history is significant for advanced maternal age. Patient does intend to breast feed. Pregnancy history fully reviewed.  Patient reports no complaints.  HISTORY: OB History  Gravida Para Term Preterm AB Living  7 4 4  0 2 4  SAB IAB Ectopic Multiple Live Births  2 0 0 0 4    # Outcome Date GA Lbr Len/2nd Weight Sex Type Anes PTL Lv  7 Current           6 SAB 07/03/22          5 Term 11/18/12   5 lb 3 oz (2.353 kg) M Vag-Spont   LIV  4 Term 06/10/10   4 lb 3 oz (1.899 kg) F Vag-Spont   LIV  3 SAB 07/03/08          2 Term 03/19/08   4 lb 3 oz (1.899 kg) F Vag-Spont   LIV  1 Term 06/09/05 [redacted]w[redacted]d  5 lb 3 oz (2.353 kg) F Vag-Spont   LIV    Last pap smear was done Unsure of date and was Abnormal pap hx previously unsure of date  Past Medical History:  Diagnosis Date   Abnormal Pap smear of cervix 2006   underwent colposcopy followed by    Functional ovarian cysts    Past Surgical History:  Procedure Laterality Date   COLPOSCOPY  2006   CRYOTHERAPY  2006   Family History  Problem Relation Age of Onset   Hypertension Mother    Heart disease Mother        also has pacemaker/AICD   Congestive Heart Failure Mother    Social History   Tobacco Use   Smoking status: Former    Types: Cigarettes   Smokeless tobacco: Never  Substance Use Topics   Alcohol use: Not Currently    Alcohol/week: 0.0 standard drinks of alcohol    Comment: occ   Drug use: No   No Known Allergies Current Outpatient Medications on File Prior to Visit  Medication Sig Dispense Refill   acetaminophen  (TYLENOL ) 325 MG tablet Take 650 mg by mouth every 6 (six) hours as needed.     cyclobenzaprine  (FLEXERIL ) 5 MG tablet Take 1 tablet (5 mg total) by mouth 3 (three) times daily as needed for muscle spasms. MAY CAUSE DROWSINESS, DO NOT OPERATE HEAVY  MACHINERY AFTER TAKING THIS MEDICATION 20 tablet 0   fluticasone  (FLONASE ) 50 MCG/ACT nasal spray Place 1 spray into both nostrils daily. 9.9 mL 0   loratadine  (CLARITIN ) 10 MG tablet Take 1 tablet (10 mg total) by mouth daily. (Patient not taking: Reported on 02/01/2024) 30 tablet 0   ondansetron  (ZOFRAN -ODT) 4 MG disintegrating tablet Take 1 tablet (4 mg total) by mouth every 8 (eight) hours as needed for nausea or vomiting. 90 tablet 0   prochlorperazine  (COMPAZINE ) 5 MG tablet Take 1 tablet (5 mg total) by mouth every 6 (six) hours. (Patient not taking: Reported on 02/01/2024) 40 tablet 0   promethazine  (PHENERGAN ) 12.5 MG tablet Take 1 tablet (12.5 mg total) by mouth every 6 (six) hours as needed for nausea or vomiting. (Patient not taking: Reported on 02/01/2024) 90 tablet 0   pyridOXINE (VITAMIN B6) 50 MG tablet Take 1 tablet (50 mg total) by mouth daily. (Patient not taking: Reported on 02/01/2024) 90 tablet 0   scopolamine  (TRANSDERM-SCOP)  1 MG/3DAYS Place 1 patch (1.5 mg total) onto the skin every 3 (three) days. (Patient not taking: Reported on 02/01/2024) 10 patch 3   terconazole  (TERAZOL 7 ) 0.4 % vaginal cream Place 1 applicator vaginally at bedtime. Use for seven days 45 g 0   No current facility-administered medications on file prior to visit.    Review of Systems Pertinent items noted in HPI and remainder of comprehensive ROS otherwise negative.    Physical Exam:   Vitals:   02/01/24 1023  Weight: 138 lb 14.4 oz (63 kg)   Fetal Heart Rate (bpm): 125-130Viable intrauterine pregnancy with positive cardiac activity noted, fetal heart rate 125-130bpm  General: well-developed, well-nourished female in no acute distress  Breasts:  normal appearance, no masses or tenderness bilaterally, exam done in the presence of a chaperone.   Skin: normal coloration and turgor, no rashes  Neurologic: oriented, normal, negative, normal mood  Extremities: normal strength, tone, and muscle mass, ROM of  all joints is normal  HEENT PERRLA, extraocular movement intact and sclera clear, anicteric  Neck supple and no masses  Cardiovascular: regular rate and rhythm  Respiratory:  no respiratory distress, normal breath sounds  Abdomen: soft, non-tender; bowel sounds normal; no masses,  no organomegaly  Pelvic: Deferred   Assessment:  Pregnancy: H2E5975 Patient Active Problem List   Diagnosis Date Noted   Supervision of low-risk pregnancy 02/01/2024    Plan:  1. Encounter for supervision of low-risk pregnancy in third trimester (Primary) Doing well. BP and FHR normal. Follow up in 2 weeks for ROB   Initial labs drawn. Continue prenatal vitamins. Problem list reviewed and updated. Genetic Screening discussed, Transfer from [facility] at 34 weeks, [facility], and [facility]: previously completed at [facility]. Ultrasound discussed; fetal anatomic survey: not applicable. Anticipatory guidance about prenatal visits given including labs, ultrasounds, and testing. Weight gain recommendations per IOM guidelines reviewed: underweight/BMI 18.5 or less > 28 - 40 lbs; normal weight/BMI 18.5 - 24.9 > 25 - 35 lbs; overweight/BMI 25 - 29.9 > 15 - 25 lbs; obese/BMI 30 or more > 11 - 20 lbs. Discussed usage of the Babyscripts app for more information about pregnancy, and to track blood pressures. Also discussed usage of virtual visits as additional source of managing and completing prenatal visits.  Patient was encouraged to use MyChart to review results, send requests, and have questions addressed.   The nature of [facility] Healthcare/Faculty Practice with multiple MDs and Advanced Practice Providers was explained to patient; also emphasized that residents, students are part of our team. Routine obstetric precautions reviewed. Encouraged to seek out care at our office or emergency room [facility] MAU preferred) for urgent and/or emergent concerns. Return in about 2 weeks (around 02/15/2024), or schedule with  midwife.     Derrek JINNY Freund, NP Student

## 2024-02-01 NOTE — Patient Instructions (Signed)
   Considering Waterbirth? Guide for patients at Center for [facility]) Why consider waterbirth? Gentle birth for babies  Less pain medicine used in labor  May allow for passive descent/less pushing  May reduce perineal tears  More mobility and instinctive maternal position changes  Increased maternal relaxation   Is waterbirth safe? What are the risks of infection, drowning or other complications? Infection:  Very low risk (3.7 % for tub vs 4.8% for bed)  7 in 8000 waterbirths with documented infection  Poorly cleaned equipment most common cause  Slightly lower group B strep transmission rate  Drowning  Maternal:  Very low risk  Related to seizures or fainting  Newborn:  Very low risk. No evidence of increased risk of respiratory problems in multiple large studies  Physiological protection from breathing under water  Avoid underwater birth if there are any fetal complications  Once baby's head is out of the water, keep it out.  Birth complication  Some reports of cord trauma, but risk decreased by bringing baby to surface gradually  No evidence of increased risk of shoulder dystocia. Mothers can usually change positions faster in water than in a bed, possibly aiding the maneuvers to free the shoulder.   There are 2 things you MUST do to have a waterbirth with [facility]: Attend a waterbirth class at [facility]   3rd Wednesday of every month from 7-9 pm (virtual during COVID) [employer] at [email or web address] or [email or web address] or by calling [phone] Bring [place] the certificate from the class to your prenatal appointment or send via MyChart Meet with a midwife at 36 weeks* to see if you can still plan a waterbirth and to sign the consent.   *We also recommend that you schedule as many of your prenatal visits with a midwife as possible.    Helpful information: You may want to bring a bathing suit top to the hospital  to wear during labor but this is optional.  All other supplies are provided by the hospital. Please arrive at the hospital with signs of active labor, and do not wait at home until late in labor. It takes 45 min- 1 hour for fetal monitoring, and check in to your room to take place, plus transport and filling of the waterbirth tub.    Things that would prevent you from having a waterbirth: Premature, <37wks  Previous cesarean birth  Presence of thick meconium-stained fluid  Multiple gestation (Twins, triplets, etc.)  Uncontrolled diabetes or gestational diabetes requiring medication  Hypertension diagnosed in pregnancy or preexisting hypertension (gestational hypertension, preeclampsia, or chronic hypertension) Fetal growth restriction (your baby measures less than 10th percentile on ultrasound) Heavy vaginal bleeding  Non-reassuring fetal heart rate  Active infection (MRSA, etc.). Group B Strep is NOT a contraindication for waterbirth.  If your labor has to be induced and induction method requires continuous monitoring of the baby's heart rate  Other risks/issues identified by your obstetrical provider   Please remember that birth is unpredictable. Under certain unforeseeable circumstances your provider may advise against giving birth in the tub. These decisions will be made on a case-by-case basis and with the safety of you and your baby as our highest priority.    Updated 10/05/21

## 2024-02-04 ENCOUNTER — Encounter: Payer: Self-pay | Admitting: *Deleted

## 2024-02-07 ENCOUNTER — Encounter (HOSPITAL_COMMUNITY): Payer: Self-pay | Admitting: Obstetrics and Gynecology

## 2024-02-07 ENCOUNTER — Other Ambulatory Visit: Payer: Self-pay

## 2024-02-07 ENCOUNTER — Inpatient Hospital Stay (HOSPITAL_COMMUNITY)
Admission: AD | Admit: 2024-02-07 | Discharge: 2024-02-07 | Disposition: A | Discharge status: Home / Self Care | Attending: Obstetrics and Gynecology | Admitting: Obstetrics and Gynecology

## 2024-02-07 DIAGNOSIS — O4703 False labor before 37 completed weeks of gestation, third trimester: Secondary | ICD-10-CM | POA: Diagnosis present

## 2024-02-07 DIAGNOSIS — Z3A35 35 weeks gestation of pregnancy: Secondary | ICD-10-CM | POA: Insufficient documentation

## 2024-02-07 NOTE — Discharge Instructions (Signed)

## 2024-02-07 NOTE — MAU Provider Note (Signed)
 History     CSN: [id number]  Arrival date and time: 02/07/24 8091   Event Date/Time   First Provider Initiated Contact with Patient 02/07/24 1924      Chief Complaint  Patient presents with   Contractions   HPI  Miranda Morrow is a 38 y.o. H2E5975 at [redacted]w[redacted]d who presents for evaluation of contractions. Patient reports contractions started at 1600 and have gotten worse. She reports they are every 4-10 minutes. Patient rates the pain as a 7/10 and has not tried anything for the pain. She reports with her last baby, she arrived to the hospital at 9cm dilated. She reports it's been 11 years and she is unsure if she remembers what labor feels like. She denies any vaginal bleeding, discharge, and leaking of fluid. Denies any constipation, diarrhea or any urinary complaints. Reports normal fetal movement.   OB History     Gravida  7   Para  4   Term  4   Preterm  0   AB  2   Living  4      SAB  2   IAB  0   Ectopic  0   Multiple  0   Live Births  4           Past Medical History:  Diagnosis Date   Abnormal Pap smear of cervix 2006   underwent colposcopy followed by    Functional ovarian cysts     Past Surgical History:  Procedure Laterality Date   COLPOSCOPY  2006   CRYOTHERAPY  2006    Family History  Problem Relation Age of Onset   Hypertension Mother    Heart disease Mother        also has pacemaker/AICD   Congestive Heart Failure Mother     Social History   Tobacco Use   Smoking status: Former    Types: Cigarettes   Smokeless tobacco: Never  Substance Use Topics   Alcohol use: Not Currently    Alcohol/week: 0.0 standard drinks of alcohol    Comment: occ   Drug use: No    Allergies: No Known Allergies  Medications Prior to Admission  Medication Sig Dispense Refill Last Dose/Taking   acetaminophen  (TYLENOL ) 325 MG tablet Take 650 mg by mouth every 6 (six) hours as needed.      cyclobenzaprine  (FLEXERIL ) 5 MG tablet Take 1 tablet (5  mg total) by mouth 3 (three) times daily as needed for muscle spasms. MAY CAUSE DROWSINESS, DO NOT OPERATE HEAVY MACHINERY AFTER TAKING THIS MEDICATION 20 tablet 0    fluticasone  (FLONASE ) 50 MCG/ACT nasal spray Place 1 spray into both nostrils daily. 9.9 mL 0    loratadine  (CLARITIN ) 10 MG tablet Take 1 tablet (10 mg total) by mouth daily. (Patient not taking: Reported on 02/01/2024) 30 tablet 0    ondansetron  (ZOFRAN -ODT) 4 MG disintegrating tablet Take 1 tablet (4 mg total) by mouth every 8 (eight) hours as needed for nausea or vomiting. 90 tablet 0    prochlorperazine  (COMPAZINE ) 5 MG tablet Take 1 tablet (5 mg total) by mouth every 6 (six) hours. (Patient not taking: Reported on 02/01/2024) 40 tablet 0    promethazine  (PHENERGAN ) 12.5 MG tablet Take 1 tablet (12.5 mg total) by mouth every 6 (six) hours as needed for nausea or vomiting. (Patient not taking: Reported on 02/01/2024) 90 tablet 0    pyridOXINE (VITAMIN B6) 50 MG tablet Take 1 tablet (50 mg total) by mouth daily. (Patient not taking:  Reported on 02/01/2024) 90 tablet 0    scopolamine  (TRANSDERM-SCOP) 1 MG/3DAYS Place 1 patch (1.5 mg total) onto the skin every 3 (three) days. (Patient not taking: Reported on 02/01/2024) 10 patch 3    terconazole  (TERAZOL 7 ) 0.4 % vaginal cream Place 1 applicator vaginally at bedtime. Use for seven days 45 g 0     Review of Systems  Constitutional: Negative.  Negative for fatigue and fever.  HENT: Negative.    Respiratory: Negative.  Negative for shortness of breath.   Cardiovascular: Negative.  Negative for chest pain.  Gastrointestinal:  Positive for abdominal pain. Negative for constipation, diarrhea, nausea and vomiting.  Genitourinary: Negative.  Negative for dysuria, vaginal bleeding and vaginal discharge.  Neurological: Negative.  Negative for dizziness and headaches.   Physical Exam   Blood pressure 114/60, pulse 84, temperature 99.1 F (37.3 C), temperature source Oral, resp. rate 15, last  menstrual period 06/05/2023, SpO2 99%, currently breastfeeding.  Patient Vitals for the past 24 hrs:  BP Temp Temp src Pulse Resp SpO2  02/07/24 1930 114/60 99.1 F (37.3 C) Oral 84 15 99 %    Physical Exam Vitals and nursing note reviewed.  Constitutional:      General: She is not in acute distress.    Appearance: She is well-developed.  HENT:     Head: Normocephalic.  Eyes:     Pupils: Pupils are equal, round, and reactive to light.  Cardiovascular:     Rate and Rhythm: Normal rate and regular rhythm.     Heart sounds: Normal heart sounds.  Pulmonary:     Effort: Pulmonary effort is normal. No respiratory distress.     Breath sounds: Normal breath sounds.  Abdominal:     General: Bowel sounds are normal. There is no distension.     Palpations: Abdomen is soft.     Tenderness: There is no abdominal tenderness.  Skin:    General: Skin is warm and dry.  Neurological:     Mental Status: She is alert and oriented to person, place, and time.  Psychiatric:        Mood and Affect: Mood normal.        Behavior: Behavior normal.        Thought Content: Thought content normal.        Judgment: Judgment normal.     Fetal Tracing:  Baseline: 120 Variability: moderate Accels: 15x15 Decels: none  Toco: none  Dilation: 1 Effacement (%): 50 Cervical Position: Posterior Station: Ballotable Exam by:: KYM Fireman CNM   MAU Course  Procedures  MDM Labs ordered and reviewed.   NST reactive 1cm upon arrival. Watched x1 hour and rechecked.  Cervix unchanged, only 1 UC while in MAU  Assessment and Plan   1. Preterm uterine contractions in third trimester, antepartum   2. [redacted] weeks gestation of pregnancy     -Discharge home in stable condition -Labor precautions discussed -Patient advised to follow-up with OB as scheduled for prenatal care -Patient may return to MAU as needed or if her condition were to change or worsen  Aleck CHRISTELLA Fireman, CNM 02/07/2024, 7:24 PM

## 2024-02-07 NOTE — MAU Note (Signed)
 MAU Triage Note  Miranda Morrow is a 38 y.o. at [redacted]w[redacted]d here in MAU reporting: ctx since 4pm, she reports she feels very uncomfortable. endorses +FM, denies VB and LOF.   Onset of complaint: 4pm Pain score: ctx 4/10 Vitals:   02/07/24 1930  BP: 114/60  Pulse: 84  Resp: 15  Temp: 99.1 F (37.3 C)  SpO2: 99%     FHT: 137  Lab orders placed from triage: provider at bedside; cervical exam

## 2024-02-15 ENCOUNTER — Other Ambulatory Visit: Payer: Self-pay

## 2024-02-15 ENCOUNTER — Encounter: Payer: Self-pay | Admitting: Family Medicine

## 2024-02-15 ENCOUNTER — Ambulatory Visit (INDEPENDENT_AMBULATORY_CARE_PROVIDER_SITE_OTHER): Admitting: Family Medicine

## 2024-02-15 ENCOUNTER — Other Ambulatory Visit (HOSPITAL_COMMUNITY)
Admission: RE | Admit: 2024-02-15 | Discharge: 2024-02-15 | Disposition: A | Discharge status: Home / Self Care | Source: Ambulatory Visit | Attending: Family Medicine | Admitting: Family Medicine

## 2024-02-15 VITALS — BP 112/75 | HR 78 | Wt 144.7 lb

## 2024-02-15 DIAGNOSIS — Z3493 Encounter for supervision of normal pregnancy, unspecified, third trimester: Secondary | ICD-10-CM

## 2024-02-15 DIAGNOSIS — O09523 Supervision of elderly multigravida, third trimester: Secondary | ICD-10-CM | POA: Diagnosis not present

## 2024-02-15 DIAGNOSIS — Z3A36 36 weeks gestation of pregnancy: Secondary | ICD-10-CM | POA: Diagnosis not present

## 2024-02-15 DIAGNOSIS — O99013 Anemia complicating pregnancy, third trimester: Secondary | ICD-10-CM | POA: Diagnosis not present

## 2024-02-15 NOTE — Progress Notes (Signed)
 PRENATAL VISIT NOTE  Subjective:  Miranda Morrow is a 38 y.o. H2E5975 at [redacted]w[redacted]d being seen today for ongoing prenatal care.  She is currently monitored for the following issues for this low-risk pregnancy and has Supervision of low-risk pregnancy; Anemia of pregnancy; and Multigravida of advanced maternal age on their problem list.  Patient reports went to MAU 8/7, 1 cm at that time. She would like her cervix checked today.  She also mentions that at her last office appointment, she was told that she would be getting an ultrasound at today's visit. Contractions: Irritability. Vag. Bleeding: None.  Movement: Present. Denies leaking of fluid.   The following portions of the patient's history were reviewed and updated as appropriate: allergies, current medications, past family history, past medical history, past social history, past surgical history and problem list.   Objective:    Vitals:   02/15/24 1029  BP: 112/75  Pulse: 78  Weight: 144 lb 11.2 oz (65.6 kg)    Fetal Status:  Fetal Heart Rate (bpm): 130   Movement: Present    General: Alert, oriented and cooperative. Patient is in no acute distress.  Skin: Skin is warm and dry. No rash noted.   Cardiovascular: Normal heart rate noted  Respiratory: Normal respiratory effort, no problems with respiration noted  Abdomen: Soft, gravid, appropriate for gestational age.  Pain/Pressure: Present     Pelvic: Cervical exam performed in the presence of a chaperone Dilation: 1 Effacement (%): 50 Station: Ballotable  Extremities: Normal range of motion.  Edema: Trace  Mental Status: Normal mood and affect. Normal behavior. Normal judgment and thought content.   Assessment and Plan:  Pregnancy: H2E5975 at [redacted]w[redacted]d 1. Encounter for supervision of low-risk pregnancy in third trimester (Primary) Prenatal course reviewed BP, HR, FHR within normal limits Feeling regular FM Asked if still interested in waterbirth, patient states she is. She  reports that she registered online but has not heard any updates, she still needs a visit with a CNM.  2. Multigravida of advanced maternal age in third trimester  3. Anemia during pregnancy in third trimester Continue PO iron supplement  4. [redacted] weeks gestation of pregnancy No change in cervical exam from MAU visit. Will look into need for repeat formal US . Per current review of records, no documentation in Initial OB visit of need for repeat US . Anatomy US  at [facility] at [redacted]w[redacted]d within normal limits. EFW 26% and AC 18%. Records available from [facility] indicate that growth US  from 28 weeks due to [facility] and possible hx SGA was discussed, unclear if follow up growth US  were complete. Request to [facility] for updated records sent today.  At a minimum, will bedside US  at next visit for presentation. From [facility] Records previously sent, most recent sent 01/09/2024. 10/31/2023 [redacted]w[redacted]d: HERE FOR NOB W/U--US  ANATOMY--20 3/7 WEEKS, EDC 03/16/24, VTX, POSTERIOR PLACENTA, AFV WNL, FHR 154, EFW 26%ILE, AC, 18%ILE, ANATOMY COMPLETE, CERVIX 4.16, OVARIES NOT VISUALIZED, GOOD FM NOTED. BEST EDC 03/16/24 BY EARLY US  AT [facility]. DOING WELL NOW, HAD INITIAL ISSUES WITH N/V AND 2 FALLS IN EARLY PREGNANCY. PRIOR SVB X 4, HAD WATERBIRTH WITH ONE DELIVERY AT [facility], REVIEWED THAT [facility] DOES NOT OFFER THAT OPTION, ADVISED PATIENT OF PRACTICES THAT DO, SHE WILL CONTINUE TO CONSIDER THE ISSUE. ALL BABIES > BETWEEN 4 AND LOW 5 LBS, DENIES ANY PRIOR DX OF IUGR. ? HX CRYO AROUND 2006, NO RECENT PAPS, DEFERRED TODAY, WILL DO LATER IN PREGNANCY OR PP PER PATIENT PREFERENCE. DECLINES GENETICS, REVIEWED RECOMMENDATION  FOR ASA 81 MG DUE TO AMA, PATIENT WILL DECIDE. PLAN TO FOLLOW GROWTH. REGISTERED FOR COMMUNITY NAVIGATION, PREGNANCY RISK FORM COMPLETED. WILL FOLLOW GROWTH FROM 28 WEEKS DUE TO AMA AND ? HX SGA.  11/27/2023 [redacted]w[redacted]d: ROB HERE FOR VISIT [facility]: STRONG [place] HICKS, NEEDS NOTE FOR WORK TO SAY SHE CAN SIT DOWN WHEN NEEDED. MAU: DENIES....WD/RMA SOME BH  (1-2/DAY), ISSUE REVIEWED. NOTE FOR WORK PER PATIENT'S REQUEST AS ABOVE. STILL CONSIDERING TRANSFERRING TO [facility]ENTER FOR [facility] FOR WATERBIRTH OPTION. RECOMMENDED MAKING DECISION SOON BY 28-30 WEEKS. GLUCOLA/RPR/HIV/CBC NV. REVIEWED NOB LABS. IF STAYS WITH [facility], RECOMMEND RECHECK GROWTH AT 30-32 WEEKS.  12/27/2023 [redacted]w[redacted]d: ROB HERE FOR VISIT/GTT/LABS [facility]: DENIES MAU: SEEN 2 WEEKS FOR CRAMPS, EVERYTHING WAS OK...WD/RMA STILL PLANNING TO TRANSFER FOR WATERBIRTH, REVIEWED NEED TO DO THAT SOON. PLANNING TO CALL [facility] AT [place], WILL SEND RECORDS PER VERBAL REQUEST TO THAT OFFICE. GLUCOLA/RPR/HIV/CBC TODAY. WILL CONTINUE TO SEE UNTIL SHE GETS AN APPT AT THAT PRACTICE. PREGNANCY RISK ASSESSMENT SHEET COMPLETED.   Preterm labor symptoms and general obstetric precautions including but not limited to vaginal bleeding, contractions, leaking of fluid and fetal movement were reviewed in detail with the patient. Please refer to After Visit Summary for other counseling recommendations.   Return in about 1 week (around 02/22/2024) for LOB with CNM to discuss waterbirth.  Future Appointments  Date Time Provider Department Center  02/22/2024  8:55 AM Lola Donnice HERO, MD [facility]  02/29/2024  8:55 AM Eveline Lynwood MATSU, MD [facility]  03/07/2024 10:15 AM Herchel, Gloris LABOR, MD [facility]    Joesph LABOR Sear, PA

## 2024-02-15 NOTE — Patient Instructions (Signed)
   Considering Waterbirth? Guide for patients at Center for [facility]) Why consider waterbirth? Gentle birth for babies  Less pain medicine used in labor  May allow for passive descent/less pushing  May reduce perineal tears  More mobility and instinctive maternal position changes  Increased maternal relaxation   Is waterbirth safe? What are the risks of infection, drowning or other complications? Infection:  Very low risk (3.7 % for tub vs 4.8% for bed)  7 in 8000 waterbirths with documented infection  Poorly cleaned equipment most common cause  Slightly lower group B strep transmission rate  Drowning  Maternal:  Very low risk  Related to seizures or fainting  Newborn:  Very low risk. No evidence of increased risk of respiratory problems in multiple large studies  Physiological protection from breathing under water  Avoid underwater birth if there are any fetal complications  Once baby's head is out of the water, keep it out.  Birth complication  Some reports of cord trauma, but risk decreased by bringing baby to surface gradually  No evidence of increased risk of shoulder dystocia. Mothers can usually change positions faster in water than in a bed, possibly aiding the maneuvers to free the shoulder.   There are 2 things you MUST do to have a waterbirth with [facility]: Attend a waterbirth class at [facility] at [facility]   3rd Wednesday of every month from 7-9 pm (virtual during COVID) [employer] at [email or web address] or [email or web address] or by calling [phone] Bring us  the certificate from the class to your prenatal appointment or send via MyChart Meet with a midwife at 36 weeks* to see if you can still plan a waterbirth and to sign the consent.   *We also recommend that you schedule as many of your prenatal visits with a midwife as possible.    Helpful information: You may want to bring a bathing suit top to the hospital  to wear during labor but this is optional.  All other supplies are provided by the hospital. Please arrive at the hospital with signs of active labor, and do not wait at home until late in labor. It takes 45 min- 1 hour for fetal monitoring, and check in to your room to take place, plus transport and filling of the waterbirth tub.    Things that would prevent you from having a waterbirth: Premature, <37wks  Previous cesarean birth  Presence of thick meconium-stained fluid  Multiple gestation (Twins, triplets, etc.)  Uncontrolled diabetes or gestational diabetes requiring medication  Hypertension diagnosed in pregnancy or preexisting hypertension (gestational hypertension, preeclampsia, or chronic hypertension) Fetal growth restriction (your baby measures less than 10th percentile on ultrasound) Heavy vaginal bleeding  Non-reassuring fetal heart rate  Active infection (MRSA, etc.). Group B Strep is NOT a contraindication for waterbirth.  If your labor has to be induced and induction method requires continuous monitoring of the baby's heart rate  Other risks/issues identified by your obstetrical provider   Please remember that birth is unpredictable. Under certain unforeseeable circumstances your provider may advise against giving birth in the tub. These decisions will be made on a case-by-case basis and with the safety of you and your baby as our highest priority.    Updated 10/05/21

## 2024-02-18 LAB — GC/CHLAMYDIA PROBE AMP (~~LOC~~) NOT AT ARMC
Chlamydia: NEGATIVE
Comment: NEGATIVE
Comment: NORMAL
Neisseria Gonorrhea: NEGATIVE

## 2024-02-19 LAB — CULTURE, BETA STREP (GROUP B ONLY): Strep Gp B Culture: NEGATIVE

## 2024-02-21 ENCOUNTER — Ambulatory Visit: Payer: Self-pay | Admitting: Family Medicine

## 2024-02-21 DIAGNOSIS — Z3493 Encounter for supervision of normal pregnancy, unspecified, third trimester: Secondary | ICD-10-CM

## 2024-02-22 ENCOUNTER — Encounter: Admitting: Family Medicine

## 2024-02-27 ENCOUNTER — Ambulatory Visit (INDEPENDENT_AMBULATORY_CARE_PROVIDER_SITE_OTHER): Admitting: Family Medicine

## 2024-02-27 ENCOUNTER — Other Ambulatory Visit: Payer: Self-pay

## 2024-02-27 VITALS — BP 118/79 | HR 74 | Wt 146.0 lb

## 2024-02-27 DIAGNOSIS — Z3493 Encounter for supervision of normal pregnancy, unspecified, third trimester: Secondary | ICD-10-CM

## 2024-02-27 DIAGNOSIS — Z3A38 38 weeks gestation of pregnancy: Secondary | ICD-10-CM

## 2024-02-27 DIAGNOSIS — O09523 Supervision of elderly multigravida, third trimester: Secondary | ICD-10-CM | POA: Diagnosis not present

## 2024-02-27 DIAGNOSIS — O99013 Anemia complicating pregnancy, third trimester: Secondary | ICD-10-CM | POA: Diagnosis not present

## 2024-02-29 ENCOUNTER — Encounter: Admitting: Obstetrics & Gynecology

## 2024-02-29 NOTE — Progress Notes (Signed)
   PRENATAL VISIT NOTE  Subjective:  Miranda Morrow is a 38 y.o. H2E5975 at [redacted]w[redacted]d being seen today for ongoing prenatal care.  She is currently monitored for the following issues for this low-risk pregnancy and has Supervision of low-risk pregnancy; Anemia of pregnancy; and Multigravida of advanced maternal age on their problem list.  Patient reports no bleeding, no leaking, and occasional contractions.  Contractions: Not present. Vag. Bleeding: None.  Movement: Present. Denies leaking of fluid.   The following portions of the patient's history were reviewed and updated as appropriate: allergies, current medications, past family history, past medical history, past social history, past surgical history and problem list.   Objective:    Vitals:   02/27/24 1551  BP: 118/79  Pulse: 74  Weight: 146 lb (66.2 kg)    Fetal Status:  Fetal Heart Rate (bpm): 133   Movement: Present    General: Alert, oriented and cooperative. Patient is in no acute distress.  Skin: Skin is warm and dry. No rash noted.   Cardiovascular: Normal heart rate noted  Respiratory: Normal respiratory effort, no problems with respiration noted  Abdomen: Soft, gravid, appropriate for gestational age.  Pain/Pressure: Absent     Pelvic: SVE performed with chaperone, 1/70/-2  Extremities: Normal range of motion.  Edema: None  Mental Status: Normal mood and affect. Normal behavior. Normal judgment and thought content.   Assessment and Plan:  Pregnancy: H2E5975 at [redacted]w[redacted]d 1. Encounter for supervision of low-risk pregnancy in third trimester FHR BP appropriate today  2. Multigravida of advanced maternal age in third trimester  3. Anemia during pregnancy in third trimester On p.o. iron  4. [redacted] weeks gestation of pregnancy (Primary) Will need to be scheduled for delivery at next visit  Term labor symptoms and general obstetric precautions including but not limited to vaginal bleeding, contractions, leaking of fluid and  fetal movement were reviewed in detail with the patient. Please refer to After Visit Summary for other counseling recommendations.   No follow-ups on file.  Future Appointments  Date Time Provider Department Center  03/07/2024 10:15 AM Herchel Gloris LABOR, MD [facility]  03/14/2024 10:15 AM Delores Nidia CROME, FNP [facility]    Norleen LULLA Rover, MD

## 2024-03-07 ENCOUNTER — Other Ambulatory Visit: Payer: Self-pay

## 2024-03-07 ENCOUNTER — Ambulatory Visit (INDEPENDENT_AMBULATORY_CARE_PROVIDER_SITE_OTHER): Admitting: Family Medicine

## 2024-03-07 ENCOUNTER — Encounter: Payer: Self-pay | Admitting: Family Medicine

## 2024-03-07 VITALS — BP 122/82 | HR 74 | Wt 151.2 lb

## 2024-03-07 DIAGNOSIS — Z3A39 39 weeks gestation of pregnancy: Secondary | ICD-10-CM

## 2024-03-07 DIAGNOSIS — Z3493 Encounter for supervision of normal pregnancy, unspecified, third trimester: Secondary | ICD-10-CM

## 2024-03-07 DIAGNOSIS — O09523 Supervision of elderly multigravida, third trimester: Secondary | ICD-10-CM | POA: Diagnosis not present

## 2024-03-07 DIAGNOSIS — O99013 Anemia complicating pregnancy, third trimester: Secondary | ICD-10-CM | POA: Diagnosis not present

## 2024-03-07 NOTE — Patient Instructions (Signed)

## 2024-03-07 NOTE — Progress Notes (Addendum)
   Subjective:  Miranda Morrow is a 38 y.o. H2E5975 at [redacted]w[redacted]d being seen today for ongoing prenatal care.  She is currently monitored for the following issues for this low-risk pregnancy and has Supervision of low-risk pregnancy; Anemia of pregnancy; and Multigravida of advanced maternal age on their problem list.  Patient reports no complaints.  Contractions: Irritability. Vag. Bleeding: None.  Movement: Present. Denies leaking of fluid.   The following portions of the patient's history were reviewed and updated as appropriate: allergies, current medications, past family history, past medical history, past social history, past surgical history and problem list. Problem list updated.  Objective:   Vitals:   03/07/24 1041  BP: 122/82  Pulse: 74  Weight: 151 lb 3.2 oz (68.6 kg)    Fetal Status: Fetal Heart Rate (bpm): 127   Movement: Present  Presentation: Vertex  General:  Alert, oriented and cooperative. Patient is in no acute distress.  Skin: Skin is warm and dry. No rash noted.   Cardiovascular: Normal heart rate noted  Respiratory: Normal respiratory effort, no problems with respiration noted  Abdomen: Soft, gravid, appropriate for gestational age. Pain/Pressure: Present     Pelvic: Vag. Bleeding: None     Cervical exam performed Dilation: 2.5 Effacement (%): 70 Station: -2  Extremities: Normal range of motion.  Edema: Trace  Mental Status: Normal mood and affect. Normal behavior. Normal judgment and thought content.   Urinalysis:      Assessment and Plan:  Pregnancy: H2E5975 at [redacted]w[redacted]d  1. Encounter for supervision of low-risk pregnancy in third trimester (Primary) BP and FHR normal Cervix with minimal increase in dilation, declined membrane sweep Reviewed labor precautions Reviewed post dates testing next week as well as PD IOL Form faxed, orders placed  2. Anemia during pregnancy in third trimester Lab Results  Component Value Date   HGB 10.4 12/25/2023   Mild, PO  iron  3. Multigravida of advanced maternal age in third trimester   Term labor symptoms and general obstetric precautions including but not limited to vaginal bleeding, contractions, leaking of fluid and fetal movement were reviewed in detail with the patient. Please refer to After Visit Summary for other counseling recommendations.  Return in 1 week (on 03/14/2024) for [facility], ob visit, post-dates testing.   Lola Donnice HERO, MD

## 2024-03-11 ENCOUNTER — Encounter (HOSPITAL_COMMUNITY): Payer: Self-pay | Admitting: *Deleted

## 2024-03-11 ENCOUNTER — Telehealth (HOSPITAL_COMMUNITY): Payer: Self-pay | Admitting: *Deleted

## 2024-03-11 NOTE — Telephone Encounter (Signed)
 Preadmission screen

## 2024-03-12 ENCOUNTER — Other Ambulatory Visit: Payer: Self-pay

## 2024-03-12 DIAGNOSIS — Z3493 Encounter for supervision of normal pregnancy, unspecified, third trimester: Secondary | ICD-10-CM

## 2024-03-13 ENCOUNTER — Ambulatory Visit (INDEPENDENT_AMBULATORY_CARE_PROVIDER_SITE_OTHER): Admitting: Obstetrics & Gynecology

## 2024-03-13 ENCOUNTER — Ambulatory Visit (INDEPENDENT_AMBULATORY_CARE_PROVIDER_SITE_OTHER)

## 2024-03-13 ENCOUNTER — Other Ambulatory Visit: Payer: Self-pay

## 2024-03-13 VITALS — BP 125/72 | HR 64 | Wt 150.6 lb

## 2024-03-13 DIAGNOSIS — Z3493 Encounter for supervision of normal pregnancy, unspecified, third trimester: Secondary | ICD-10-CM | POA: Diagnosis not present

## 2024-03-13 DIAGNOSIS — Z0289 Encounter for other administrative examinations: Secondary | ICD-10-CM

## 2024-03-13 DIAGNOSIS — Z3A38 38 weeks gestation of pregnancy: Secondary | ICD-10-CM

## 2024-03-13 DIAGNOSIS — O09523 Supervision of elderly multigravida, third trimester: Secondary | ICD-10-CM

## 2024-03-13 DIAGNOSIS — Z3A40 40 weeks gestation of pregnancy: Secondary | ICD-10-CM

## 2024-03-13 NOTE — Progress Notes (Signed)
   PRENATAL VISIT NOTE  Subjective:  Miranda Morrow is a 38 y.o. H2E5975 at [redacted]w[redacted]d being seen today for ongoing prenatal care.  She is currently monitored for the following issues for this low-risk pregnancy and has Supervision of low-risk pregnancy; Anemia of pregnancy; and Multigravida of advanced maternal age on their problem list.  Patient reports occasional contractions.  Contractions: Irritability. Vag. Bleeding: None.  Movement: Present. Denies leaking of fluid.   The following portions of the patient's history were reviewed and updated as appropriate: allergies, current medications, past family history, past medical history, past social history, past surgical history and problem list.   Objective:    Vitals:   03/13/24 1615  BP: 125/72  Pulse: 64  Weight: 150 lb 9.6 oz (68.3 kg)    Fetal Status:  Fetal Heart Rate (bpm): 126   Movement: Present Presentation: Vertex  General: Alert, oriented and cooperative. Patient is in no acute distress.  Skin: Skin is warm and dry. No rash noted.   Cardiovascular: Normal heart rate noted  Respiratory: Normal respiratory effort, no problems with respiration noted  Abdomen: Soft, gravid, appropriate for gestational age.  Pain/Pressure: Present     Pelvic: Cervical exam performed in the presence of a chaperone Dilation: 3 Effacement (%): 70 Station: Ballotable  Extremities: Normal range of motion.  Edema: None  Mental Status: Normal mood and affect. Normal behavior. Normal judgment and thought content.   Assessment and Plan:  Pregnancy: H2E5975 at [redacted]w[redacted]d 1. Multigravida of advanced maternal age in third trimester (Primary)   2. Encounter for supervision of low-risk pregnancy in third trimester Labor precautions  Term labor symptoms and general obstetric precautions including but not limited to vaginal bleeding, contractions, leaking of fluid and fetal movement were reviewed in detail with the patient. Please refer to After Visit  Summary for other counseling recommendations.   Return if symptoms worsen or fail to improve.  Future Appointments  Date Time Provider Department Center  03/18/2024  6:45 AM [facility] SCHED ROOM [facility] None    Lynwood Solomons, MD

## 2024-03-14 ENCOUNTER — Encounter: Admitting: Obstetrics and Gynecology

## 2024-03-14 ENCOUNTER — Encounter (HOSPITAL_COMMUNITY): Payer: Self-pay | Admitting: Family Medicine

## 2024-03-14 ENCOUNTER — Inpatient Hospital Stay (HOSPITAL_COMMUNITY)
Admission: AD | Admit: 2024-03-14 | Discharge: 2024-03-16 | DRG: 776 | Disposition: A | Discharge status: Home / Self Care | Attending: Family Medicine | Admitting: Family Medicine

## 2024-03-14 ENCOUNTER — Other Ambulatory Visit: Payer: Self-pay

## 2024-03-14 ENCOUNTER — Inpatient Hospital Stay (EMERGENCY_DEPARTMENT_HOSPITAL)
Admission: AD | Admit: 2024-03-14 | Discharge: 2024-03-14 | Disposition: A | Discharge status: Home / Self Care | Source: Home / Self Care | Attending: Obstetrics and Gynecology | Admitting: Obstetrics and Gynecology

## 2024-03-14 ENCOUNTER — Encounter (HOSPITAL_COMMUNITY): Payer: Self-pay | Admitting: Obstetrics and Gynecology

## 2024-03-14 DIAGNOSIS — Z87891 Personal history of nicotine dependence: Secondary | ICD-10-CM | POA: Diagnosis not present

## 2024-03-14 DIAGNOSIS — O471 False labor at or after 37 completed weeks of gestation: Secondary | ICD-10-CM | POA: Insufficient documentation

## 2024-03-14 DIAGNOSIS — Z8249 Family history of ischemic heart disease and other diseases of the circulatory system: Secondary | ICD-10-CM | POA: Diagnosis not present

## 2024-03-14 DIAGNOSIS — Z3A40 40 weeks gestation of pregnancy: Secondary | ICD-10-CM

## 2024-03-14 DIAGNOSIS — Z3493 Encounter for supervision of normal pregnancy, unspecified, third trimester: Secondary | ICD-10-CM

## 2024-03-14 DIAGNOSIS — D62 Acute posthemorrhagic anemia: Secondary | ICD-10-CM | POA: Diagnosis present

## 2024-03-14 DIAGNOSIS — O480 Post-term pregnancy: Secondary | ICD-10-CM | POA: Diagnosis not present

## 2024-03-14 DIAGNOSIS — O479 False labor, unspecified: Secondary | ICD-10-CM | POA: Diagnosis not present

## 2024-03-14 DIAGNOSIS — O9081 Anemia of the puerperium: Secondary | ICD-10-CM | POA: Diagnosis present

## 2024-03-14 DIAGNOSIS — Z8759 Personal history of other complications of pregnancy, childbirth and the puerperium: Secondary | ICD-10-CM

## 2024-03-14 DIAGNOSIS — O09523 Supervision of elderly multigravida, third trimester: Secondary | ICD-10-CM | POA: Diagnosis not present

## 2024-03-14 LAB — TYPE AND SCREEN
ABO/RH(D): AB POS
Antibody Screen: NEGATIVE

## 2024-03-14 MED ORDER — DIBUCAINE (PERIANAL) 1 % EX OINT: 1.0000 | TOPICAL_OINTMENT | CUTANEOUS | Status: DC | PRN

## 2024-03-14 MED ORDER — WITCH HAZEL-GLYCERIN EX PADS: 1.0000 | MEDICATED_PAD | CUTANEOUS | Status: DC | PRN

## 2024-03-14 MED ORDER — OXYCODONE HCL 5 MG PO TABS: 10.0000 mg | ORAL_TABLET | ORAL | Status: DC | PRN

## 2024-03-14 MED ORDER — ONDANSETRON HCL 4 MG PO TABS: 4.0000 mg | ORAL_TABLET | ORAL | Status: DC | PRN

## 2024-03-14 MED ORDER — ONDANSETRON HCL 4 MG/2ML IJ SOLN: 4.0000 mg | Freq: Four times a day (QID) | INTRAMUSCULAR | Status: DC | PRN

## 2024-03-14 MED ORDER — BENZOCAINE-MENTHOL 20-0.5 % EX AERO: 1.0000 | INHALATION_SPRAY | CUTANEOUS | Status: DC | PRN

## 2024-03-14 MED ORDER — FENTANYL CITRATE (PF) 100 MCG/2ML IJ SOLN: 50.0000 ug | INTRAMUSCULAR | Status: DC | PRN

## 2024-03-14 MED ORDER — LIDOCAINE HCL (PF) 1 % IJ SOLN
30.0000 mL | INTRAMUSCULAR | Status: DC | PRN
Start: 2024-03-14 — End: 2024-03-14

## 2024-03-14 MED ORDER — COCONUT OIL OIL
1.0000 | TOPICAL_OIL | Status: DC | PRN
  Administered 2024-03-15: 1 via TOPICAL

## 2024-03-14 MED ORDER — SIMETHICONE 80 MG PO CHEW: 80.0000 mg | CHEWABLE_TABLET | ORAL | Status: DC | PRN

## 2024-03-14 MED ORDER — LACTATED RINGERS IV SOLN: 500.0000 mL | INTRAVENOUS | Status: DC | PRN

## 2024-03-14 MED ORDER — ONDANSETRON HCL 4 MG/2ML IJ SOLN: 4.0000 mg | INTRAMUSCULAR | Status: DC | PRN

## 2024-03-14 MED ORDER — OXYTOCIN-SODIUM CHLORIDE 30-0.9 UT/500ML-% IV SOLN: 2.5000 [IU]/h | INTRAVENOUS | Status: DC

## 2024-03-14 MED ORDER — PRENATAL MULTIVITAMIN CH: 1.0000 | ORAL_TABLET | Freq: Every day | ORAL | Status: DC

## 2024-03-14 MED ORDER — SENNOSIDES-DOCUSATE SODIUM 8.6-50 MG PO TABS
2.0000 | ORAL_TABLET | Freq: Every day | ORAL | Status: DC
  Filled 2024-03-14: qty 2

## 2024-03-14 MED ORDER — SOD CITRATE-CITRIC ACID 500-334 MG/5ML PO SOLN
30.0000 mL | ORAL | Status: DC | PRN
Start: 2024-03-14 — End: 2024-03-14

## 2024-03-14 MED ORDER — ACETAMINOPHEN 325 MG PO TABS: 650.0000 mg | ORAL_TABLET | ORAL | Status: DC | PRN

## 2024-03-14 MED ORDER — OXYCODONE HCL 5 MG PO TABS: 5.0000 mg | ORAL_TABLET | ORAL | Status: DC | PRN

## 2024-03-14 MED ORDER — OXYTOCIN BOLUS FROM INFUSION: 333.0000 mL | Freq: Once | INTRAVENOUS | Status: DC

## 2024-03-14 MED ORDER — DIPHENHYDRAMINE HCL 25 MG PO CAPS: 25.0000 mg | ORAL_CAPSULE | Freq: Four times a day (QID) | ORAL | Status: DC | PRN

## 2024-03-14 MED ORDER — LACTATED RINGERS IV SOLN: INTRAVENOUS | Status: DC

## 2024-03-14 MED ORDER — IBUPROFEN 600 MG PO TABS
600.0000 mg | ORAL_TABLET | Freq: Four times a day (QID) | ORAL | Status: DC
  Filled 2024-03-14 (×3): qty 1

## 2024-03-14 NOTE — Discharge Instructions (Signed)
 Reasons to return to MAU at [facility]:  1.  Contractions are  5 minutes apart or less, each last 1 minute, these have been going on for 1-2 hours, and you cannot walk or talk during them 2.  You have a large gush of fluid, or a trickle of fluid that will not stop and you have to wear a pad 3.  You have bleeding that is bright red, heavier than spotting--like menstrual bleeding (spotting can be normal in early labor or after a check of your cervix) 4.  You do not feel the baby moving like he/she normally does

## 2024-03-14 NOTE — Lactation Note (Signed)
 This note was copied from a baby's chart. Lactation Consultation Note  Patient Name: Miranda Morrow Unijb'd Date: 03/14/2024 Age:36 hours   [name] does not want to see [facility] team. [name] may still call for assistance as needed.  Feeding Mother's Current Feeding Choice: Breast Milk  Consult Status Consult Status: Complete (mother declined follow up)    Recardo Hoit BS, IBCLC 03/14/2024, 6:04 PM

## 2024-03-14 NOTE — H&P (Signed)
 OBSTETRIC ADMISSION HISTORY AND PHYSICAL  Miranda Morrow is a 38 y.o. female [phone] with IUP at [redacted]w[redacted]d by LMP presenting by EMS following vaginal birth at home. Denies headache, vision changes, or RUQ pain. She plans on breastfeeding. She does not desire birth control. She received her prenatal care at [facility]; transferred from [facility] at 34 weeks for waterbirth.  Dating: By LMP --->  Estimated Date of Delivery: 03/11/24  NURSING  PROVIDER  Office Location [facility] Dating by LMP  [facility] Model Traditional Anatomy U/S normal  Initiated care at  Transfer at 34 weeks                Language  English              LAB RESULTS   Support Person Daughter  Genetics NIPS: No components found for: NTRARPTSUM AFP:     NT/IT (FT only)     Carrier Screen Horizon:   Rhogam  AB/Positive/-- (04/30 0000) A1C/GTT Early HgbA1C: normal Third trimester 2 hr GTT: normal 1 hr GTT  Flu Vaccine     TDaP Vaccine   Blood Type AB/Positive/-- (04/30 0000)  RSV Vaccine  Antibody Negative (04/30 0000)  COVID Vaccine  Rubella Immune (04/30 0000)  Feeding Plan Breast  RPR Nonreactive (06/24 0000)  Contraception No  HBsAg Negative (04/30 0000)  Circumcision If Boy No  HIV Non-reactive (06/24 0000)  Pediatrician  Carlin Blamer  HCVAb Negative (04/30 0000)  Prenatal Classes     BTL Consent  Pap *needs PP*  BTL Pre-payment  GC/CT Initial:   36wks:  neg/neg  VBAC Consent  GBS Negative/-- (08/15 1231) For PCN allergy, check sensitivities   BRx Optimized? [ ]  yes   [ ]  no    DME Rx [ ]  BP cuff [ ]  Weight Scale Waterbirth  [ ]  Class [ ]  Consent [ ]  CNM visit  PHQ9 & GAD7 [  ] new OB [  ] 28 weeks  [  ] 36 weeks Induction  [ ]  Orders Entered [ ] Foley Y/N   Prenatal History/Complications: AMA, SGA newborn  Past Medical History: Past Medical History:  Diagnosis Date   Abnormal Pap smear of cervix 07/03/2004   underwent colposcopy followed by    Functional ovarian cysts    Past Surgical History: Past  Surgical History:  Procedure Laterality Date   COLPOSCOPY  2006   CRYOTHERAPY  2006   Obstetrical History: OB History     Gravida  7   Para  5   Term  5   Preterm  0   AB  2   Living  5      SAB  2   IAB  0   Ectopic  0   Multiple  0   Live Births  5          Social History Social History   Socioeconomic History   Marital status: Single    Spouse name: Not on file   Number of children: 4   Years of education: 9   Highest education level: Not on file  Occupational History   Occupation: Conservation officer, nature at [employer]  Tobacco Use   Smoking status: Former    Types: Cigarettes   Smokeless tobacco: Never  Vaping Use   Vaping status: Never Used  Substance and Sexual Activity   Alcohol use: Not Currently    Alcohol/week: 0.0 standard drinks of alcohol    Comment: occ   Drug use: No  Sexual activity: Not Currently    Birth control/protection: None  Other Topics Concern   Not on file  Social History Narrative   Lives at home with her children except for daughter, Bobie with her dad's family   Originally from [place]--came down to [place] when in [employer].   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   Social Drivers of Corporate investment banker Strain: Not on file  Food Insecurity: No Food Insecurity (03/14/2024)   Hunger Vital Sign    Worried About Running Out of Food in the Last Year: Never true    Ran Out of Food in the Last Year: Never true  Recent Concern: Food Insecurity - Food Insecurity Present (02/01/2024)   Hunger Vital Sign    Worried About Running Out of Food in the Last Year: Never true    Ran Out of Food in the Last Year: Sometimes true  Transportation Needs: No Transportation Needs (03/14/2024)   PRAPARE - Administrator, Civil Service (Medical): No    Lack of Transportation (Non-Medical): No  Recent Concern: Transportation Needs - Unmet Transportation Needs (02/01/2024)   PRAPARE - Scientist, research (physical sciences) (Medical): No    Lack of Transportation (Non-Medical): Yes  Physical Activity: Not on file  Stress: Not on file  Social Connections: Not on file   Family History: Family History  Problem Relation Age of Onset   Hypertension Mother    Heart disease Mother        also has pacemaker/AICD   Congestive Heart Failure Mother    Allergies: No Known Allergies  Medications Prior to Admission  Medication Sig Dispense Refill Last Dose/Taking   acetaminophen  (TYLENOL ) 325 MG tablet Take 650 mg by mouth every 6 (six) hours as needed.   Past Week   fluticasone  (FLONASE ) 50 MCG/ACT nasal spray Place 1 spray into both nostrils daily. 9.9 mL 0 Past Month   Review of Systems   All systems reviewed and negative except as stated in HPI  Blood pressure 122/65, pulse 80, temperature 98.4 F (36.9 C), temperature source Oral, resp. rate 18, height 4' 11 (1.499 m), weight 68 kg, last menstrual period 06/05/2023, unknown if currently breastfeeding. General appearance: alert, cooperative, and no distress Lungs: normal respiratory rate and effort Heart: regular rate Abdomen: soft, non-tender, fundus firm Pelvic: see delivery note Extremities: 1+ pitting edema BLL  Prenatal labs: ABO, Rh: AB/Positive/-- (04/30 0000) Antibody: Negative (04/30 0000) Rubella: Immune (04/30 0000) RPR: Nonreactive (06/24 0000)  HBsAg: Negative (04/30 0000)  HIV: Non-reactive (06/24 0000)  GBS: Negative/-- (08/15 1231)    Lab Results  Component Value Date   GBS Negative 02/15/2024   GTT 1 HR 97 (12/25/23) Genetic screening declined at [facility]  at [redacted]w[redacted]d by [facility] limited report in Epic shows CWD, normal anatomy, vertex presentation, 26% EFW   Immunization History  Administered Date(s) Administered   Tdap 11/02/2015   Prenatal Transfer Tool  Maternal Diabetes: No Genetic Screening: declined Maternal Ultrasounds/Referrals: Normal Fetal Ultrasounds or other Referrals:  None Maternal Substance  Abuse:  No Significant Maternal Medications:  None Significant Maternal Lab Results: Group B Strep negative Number of Prenatal Visits:greater than 3 verified prenatal visits Maternal Vaccinations: None  No results found for this or any previous visit (from the past 24 hours).  Patient Active Problem List   Diagnosis Date Noted   Post-dates pregnancy 03/14/2024   Supervision of low-risk pregnancy 02/01/2024   Anemia of pregnancy  11/04/2023   Multigravida of advanced maternal age 64/21/2025   Assessment/Plan:  Miranda Morrow is a 38 y.o. H2E4974 at [redacted]w[redacted]d here following vaginal delivery at home. Admit to L&D for monitoring. See delivery note for details. Plan to transfer to postpartum unit at 2 hours postpartum.   Vernell FORBES Ruddle, Student-MidWife  03/14/2024, 6:13 PM

## 2024-03-14 NOTE — MAU Provider Note (Addendum)
 S:  38 y.o. H2E5975 at [redacted]w[redacted]d presents for labor evaluation.  Pt receives care at [facility], and transferred to our office from [facility] because she desires a waterbirth. Pregnancy is low risk/uncomplicated.  Pt has taken the waterbirth class and had 3-4 prenatal visits in the office, but has not been scheduled with a waterbirth provider/midwife.    O: BP 121/74   Pulse 70   Temp 98.3 F (36.8 C)   Resp 18   LMP 06/05/2023    VS reviewed, nursing note reviewed,  Constitutional: well developed, well nourished, no distress HEENT: normocephalic CV: normal rate Pulm/chest wall: normal effort Abdomen: soft Neuro: alert and oriented x 3 Skin: warm, dry Psych: affect normal   Dilation: 4.5 Effacement (%): 70 Cervical Position: Middle Station: -1 Presentation: Vertex Exam by:: K.Wilson,RN   A/P: Labor evaluation RN to recheck cervix FHR tracing with baseline 135, moderate variability and with accelerations so reactive NST Pt off the monitor to walk before RN rechecks cervix No barriers to waterbirth if pt is admitted now or at another MAU presentation.  Olam Boards, CNM 1:15 PM

## 2024-03-14 NOTE — MAU Note (Signed)
 MAU Labor Evaluation RN Medical Screening Exam: RN Assessment:  .Miranda Morrow is a 38 y.o. at [redacted]w[redacted]d here in MAU for evaluation of labor. See RN triage note.   Pain Score: 8  Pain Location: Abdomen  Cervical exam:  Dilation: 4.5 Effacement (%): 70 Cervical Position: Middle Station: -1 Presentation: Vertex Exam by:: K.Daphney Hopke,RN  Fetal Monitoring: Baseline Rate (A): 120 bpm Variability: Moderate Accelerations: 15 x 15 Decelerations: None Contraction Frequency (min): 7-10  Vitals:   03/14/24 1129 03/14/24 1355  BP: 121/74 123/74  Pulse: 70 66  Resp: 18   Temp: 98.3 F (36.8 C)       Medical Decision Making & Provider Communication:  This RN has communicated with: Provider Name/Title: J.Walker,CNM  Communicated with MAU provider DOROTHA Finder, CNM SBAR report of labor evaluation. Also reviewed contraction pattern and that non-stress test is reactive.  Contraction Frequency (min): 7-10 has been documented with no cervical change over 2.5 hours not indicating active labor.  Patient denies any other complaints.  Based on this report provider has given order for discharge. A discharge order and diagnosis entered by a provider. Labor discharge precautions reviewed with patient and all questions answered. Patient verbalized understanding.   Plan of Care: Discharge home with strict labor precautions

## 2024-03-14 NOTE — Discharge Summary (Shared)
 Postpartum Discharge Summary  Date of Service updated***     Patient Name: Miranda Morrow DOB: 1985/08/11 MRN: [id number]  Date of admission: 03/14/2024 Delivery date:03/14/2024 Delivering provider: VANNIE MATAR R Date of discharge: 03/14/2024  Admitting diagnosis: Post-dates pregnancy [O48.0] Intrauterine pregnancy: [redacted]w[redacted]d     Secondary diagnosis:  Principal Problem:   Post-dates pregnancy  Additional problems: ***    Discharge diagnosis: Term Pregnancy Delivered                                              Post partum procedures:*** Augmentation: N/A Complications: None  Hospital course: Onset of Labor With Vaginal Delivery      38 y.o. yo H2E4974 at [redacted]w[redacted]d was admitted after accidental home delivery on 03/14/2024. Labor course was complicated by none.  Membrane Rupture Time/Date:  ,   Delivery Method:Vaginal, Spontaneous Operative Delivery:N/A Episiotomy: None Lacerations:  Labial Patient had a postpartum course complicated by ***.  She is ambulating, tolerating a regular diet, passing flatus, and urinating well. Patient is discharged home in stable condition on 03/14/24.  Newborn Data: Birth date:03/14/2024 Birth time:4:11 PM Gender:Female Living status:Living Apgars: ,  Weight:6 lb 9.5 oz (2.99 kg)  Magnesium Sulfate received: No BMZ received: No Rhophylac:N/A MMR:N/A T-DaP:Offered postpartum Flu: N/A RSV Vaccine received: No Transfusion:No  Immunizations received: Immunization History  Administered Date(s) Administered   Tdap 11/02/2015   Physical exam  Vitals:   03/14/24 1737  BP: 122/65  Pulse: 80  Resp: 18  Temp: 98.4 F (36.9 C)  TempSrc: Oral  Weight: 150 lb (68 kg)  Height: 4' 11 (1.499 m)   General: {Exam; general:21111117} Lochia: {Desc; appropriate/inappropriate:30686::appropriate} Uterine Fundus: {Desc; firm/soft:30687} Incision: {Exam; incision:21111123} DVT Evaluation: {Exam; icu:7888877} Labs: Lab Results  Component Value  Date   WBC 10.6 (H) 10/01/2023   HGB 10.4 12/25/2023   HCT 34 12/25/2023   MCV 77.5 (L) 10/01/2023   PLT 187 12/25/2023      Latest Ref Rng & Units 10/01/2023    3:19 PM  CMP  Glucose 70 - 99 mg/dL 93   BUN 6 - 20 mg/dL <5   Creatinine 9.55 - 1.00 mg/dL 9.55   Sodium 864 - 854 mmol/L 134   Potassium 3.5 - 5.1 mmol/L 3.6   Chloride 98 - 111 mmol/L 105   CO2 22 - 32 mmol/L 21   Calcium 8.9 - 10.3 mg/dL 8.8   Total Protein 6.5 - 8.1 g/dL 6.5   Total Bilirubin 0.0 - 1.2 mg/dL 0.3   Alkaline Phos 38 - 126 U/L 32   AST 15 - 41 U/L 14   ALT 0 - 44 U/L 10    Edinburgh Score:     No data to display         No data recorded  After visit meds:  Allergies as of 03/14/2024   No Known Allergies   Med Rec must be completed prior to using this [facility]***     Discharge home in stable condition Infant Feeding: {Baby feeding:23562} Infant Disposition:{CHL IP OB HOME WITH FNUYZM:76418} Discharge instruction: per After Visit Summary and Postpartum booklet. Activity: Advance as tolerated. Pelvic rest for 6 weeks.  Diet: {OB ipzu:78888878} Future Appointments: Future Appointments  Date Time Provider Department Center  03/18/2024  6:45 AM [facility] SCHED ROOM [facility] None  04/09/2024  2:55 PM VANNIE MATAR SAUNDERS, CNM [facility]  Follow up Visit: Postpartum appointment scheduled. Please schedule this patient for a In person postpartum visit in 4 weeks with the following provider: Cornell Finder, CNM. Additional Postpartum F/U:N/A  Low risk pregnancy complicated by: N/A Delivery mode:  Vaginal, Spontaneous Anticipated Birth Control:  None  03/14/2024 Cornell JONELLE Finder, CNM

## 2024-03-14 NOTE — MAU Note (Signed)
 Miranda Morrow is a 38 y.o. at [redacted]w[redacted]d here in MAU reporting: has been having ctx all morning getting worse. Reports some bloody show and losing mucus plug yesterday. No leaking. Unsure about fetal movement just feeling ctx.  3cm yesterday  LMP:  Onset of complaint: this morning Pain score: 8 Vitals:   03/14/24 1129  BP: 121/74  Pulse: 70  Resp: 18  Temp: 98.3 F (36.8 C)     FHT: 120  Lab orders placed from triage: labor eval

## 2024-03-15 LAB — CBC
HCT: 30.8 % — ABNORMAL LOW (ref 36.0–46.0)
Hemoglobin: 9.7 g/dL — ABNORMAL LOW (ref 12.0–15.0)
MCH: 24.9 pg — ABNORMAL LOW (ref 26.0–34.0)
MCHC: 31.5 g/dL (ref 30.0–36.0)
MCV: 79.2 fL — ABNORMAL LOW (ref 80.0–100.0)
Platelets: 198 K/uL (ref 150–400)
RBC: 3.89 MIL/uL (ref 3.87–5.11)
RDW: 13.5 % (ref 11.5–15.5)
WBC: 14.2 K/uL — ABNORMAL HIGH (ref 4.0–10.5)
nRBC: 0 % (ref 0.0–0.2)

## 2024-03-15 LAB — RPR: RPR Ser Ql: NONREACTIVE

## 2024-03-15 NOTE — Progress Notes (Signed)
 POSTPARTUM PROGRESS NOTE  Post Partum Day 1  Subjective:  Miranda Morrow is a 38 y.o. H2E4974 s/p SVD at [redacted]w[redacted]d.  She reports she is doing well. No acute events overnight. She denies any problems with ambulating, voiding or po intake. Denies nausea or vomiting.  Pain is well controlled.  Lochia is Normal.  Objective: Blood pressure 125/75, pulse 67, temperature 98.1 F (36.7 C), temperature source Oral, resp. rate 20, height 4' 11 (1.499 m), weight 68 kg, last menstrual period 06/05/2023, SpO2 100%, unknown if currently breastfeeding.  BP Readings from Last 3 Encounters:  03/15/24 125/75  03/14/24 123/74  03/13/24 125/72    Physical Exam:  General: alert, cooperative and no distress Chest: no respiratory distress Heart:regular rate, distal pulses intact Uterine Fundus: firm, appropriately tender DVT Evaluation: No calf swelling or tenderness Extremities: no edema Skin: warm, dry  No results for input(s): HGB, HCT in the last 72 hours.  Assessment/Plan: Miranda Morrow is a 38 y.o. [phone] s/p NSVD at [redacted]w[redacted]d   PPD# 1 - Doing well  Routine postpartum care  Delivery Complications: none  Blood Pressure: normal Hb Status: Stable, appropriate postpartum Hb, no intervention indicated Contraception: declines Feeding: breast feeding Needs Lactation Consultation: Yes    Dispo: Plan for discharge tomorrow.   LOS: 1 day   Miranda DELENA Courts, MD OB Fellow  03/15/2024, 7:35 AM

## 2024-03-16 DIAGNOSIS — D62 Acute posthemorrhagic anemia: Secondary | ICD-10-CM | POA: Diagnosis present

## 2024-03-16 DIAGNOSIS — O9081 Anemia of the puerperium: Secondary | ICD-10-CM | POA: Diagnosis present

## 2024-03-16 DIAGNOSIS — Z8759 Personal history of other complications of pregnancy, childbirth and the puerperium: Secondary | ICD-10-CM

## 2024-03-16 MED ORDER — PRENATAL MULTIVITAMIN CH: 1.0000 | ORAL_TABLET | Freq: Every day | ORAL | 0 refills | Status: AC

## 2024-03-16 MED ORDER — ACETAMINOPHEN 500 MG PO TABS: 1000.0000 mg | ORAL_TABLET | Freq: Four times a day (QID) | ORAL | 0 refills | Status: AC | PRN

## 2024-03-16 MED ORDER — IBUPROFEN 600 MG PO TABS: 600.0000 mg | ORAL_TABLET | Freq: Four times a day (QID) | ORAL | 0 refills | Status: AC

## 2024-03-16 MED ORDER — FERROUS SULFATE 325 (65 FE) MG PO TABS
325.0000 mg | ORAL_TABLET | Freq: Every day | ORAL | Status: DC
Start: 2024-03-16 — End: 2024-03-16

## 2024-03-18 ENCOUNTER — Inpatient Hospital Stay (HOSPITAL_COMMUNITY)

## 2024-03-18 ENCOUNTER — Inpatient Hospital Stay (HOSPITAL_COMMUNITY): Admission: RE | Admit: 2024-03-18 | Source: Home / Self Care | Admitting: Family Medicine

## 2024-03-24 ENCOUNTER — Encounter: Payer: Self-pay | Admitting: Family Medicine

## 2024-03-24 DIAGNOSIS — Z0289 Encounter for other administrative examinations: Secondary | ICD-10-CM

## 2024-03-25 ENCOUNTER — Telehealth: Payer: Self-pay

## 2024-03-25 NOTE — Telephone Encounter (Signed)
 RN called pt to clarify leave request.  Advised pt her paperwork has been completed and will be faxed to her employer per her request.     [place]addell, RN

## 2024-04-03 ENCOUNTER — Telehealth: Payer: Self-pay

## 2024-04-03 NOTE — Telephone Encounter (Signed)
 New Work accomodation completed and faxed to [employer] per pt request.    Waddell, RN

## 2024-04-09 ENCOUNTER — Encounter: Payer: Self-pay | Admitting: Certified Nurse Midwife

## 2024-04-09 ENCOUNTER — Ambulatory Visit (INDEPENDENT_AMBULATORY_CARE_PROVIDER_SITE_OTHER): Payer: Self-pay | Admitting: Certified Nurse Midwife

## 2024-04-09 ENCOUNTER — Other Ambulatory Visit: Payer: Self-pay

## 2024-04-09 NOTE — Progress Notes (Unsigned)
 Postpartum Visit Note  Miranda Morrow is a 38 y.o. [phone] female who presents for a postpartum visit. She is 3 weeks 5 days postpartum following an unplanned vaginal delivery at home; taken to hospital by EMS for delivery of placenta and postpartum care.  I have fully reviewed the prenatal and intrapartum course. The delivery was at [redacted]w[redacted]d.  Anesthesia: none. Postpartum course has been uncomplicated. Baby is doing well. Baby is feeding by breast. Bleeding: spotting. Bowel function is normal. Bladder function is normal. Patient is not sexually active. Contraception method is none. Postpartum depression screening: negative.   Upstream - 04/09/24 1516       Pregnancy Intention Screening   Does the patient want to become pregnant in the next year? No    Does the patient's partner want to become pregnant in the next year? No    Would the patient like to discuss contraceptive options today? No      Contraception Wrap Up   Current Method No Method - Other Reason   recent pregnancy   End Method Withdrawal or Other Method         The pregnancy intention screening data noted above was reviewed. Potential methods of contraception were discussed. The patient elected to proceed with Withdrawal or Other Method.   Health Maintenance Due  Topic Date Due   Hepatitis B Vaccines 19-59 Average Risk (1 of 3 - 19+ 3-dose series) Never done   HPV VACCINES (1 - 3-dose SCDM series) Never done   Cervical Cancer Screening (HPV/Pap Cotest)  Never done   Influenza Vaccine  Never done   COVID-19 Vaccine (1 - 2025-26 season) Never done    The following portions of the patient's history were reviewed and updated as appropriate: allergies, current medications, past family history, past medical history, past social history, past surgical history, and problem list.  Review of Systems Pertinent items noted in HPI and remainder of comprehensive ROS otherwise negative.  Objective:  BP 112/69   Pulse 60   Wt  135 lb 4.8 oz (61.4 kg)   LMP 06/05/2023   Breastfeeding Yes   BMI 27.33 kg/m    Constitutional: Alert, oriented female in no physical distress.  HEENT: PERRLA Skin: normal color and turgor, no rash Cardiovascular: normal rate & rhythm Respiratory: normal effort, no problems with respiration noted GI: Abd soft, non-tender MS: Extremities nontender, no edema, normal ROM Neurologic: Alert and oriented x 4.  GU: no CVA tenderness Pelvic: exam deferred Breasts: normal lactating breasts, no edema or signs of nipple damage   Assessment:  1. Postpartum care and examination (Primary) - Recovering well  Plan:   Essential components of care per ACOG recommendations:  1.  Mood and well being: Patient with negative depression screening today. Reviewed local resources for support.  - Patient tobacco use? No.   - hx of drug use? No.    2. Infant care and feeding:  -Patient currently breastmilk feeding? Yes. Discussed returning to work and pumping.  -Social determinants of health (SDOH) reviewed in EPIC. No concerns.  3. Sexuality, contraception and birth spacing - Patient does not want a pregnancy in the next year.  Desired family size is 5 children.  - Reviewed reproductive life planning. Reviewed contraceptive methods based on pt preferences and effectiveness.  Patient desired Withdrawal or Other Method today.   - Discussed birth spacing of 18 months  4. Sleep and fatigue -Encouraged family/partner/community support of 4 hrs of uninterrupted sleep to help with mood and  fatigue  5. Physical Recovery  - Discussed patients delivery and complications. She describes her labor as good. - Patient had an unplanned vaginal birth at home. Patient had a right labial laceration, without repair. Perineal healing reviewed. Patient expressed understanding - Patient has urinary incontinence? No. - Patient is safe to resume physical and sexual activity  6.  Health Maintenance - HM due items  addressed Yes - Last pap smear: no result available. Pap smear not done at today's visit due to being [redacted]w[redacted]d PP, plan to return. -Breast Cancer screening indicated? No.   7. Chronic Disease/Pregnancy Condition follow up: None - PCP follow up  Future Appointments  Date Time Provider Department Center  05/21/2024  2:15 PM Vannie Cornell SAUNDERS, CNM [facility]     Cornell SAUNDERS Vannie, CNM [facility] for [employer], [facility]

## 2024-05-21 ENCOUNTER — Ambulatory Visit: Payer: Self-pay | Admitting: Certified Nurse Midwife
